# Patient Record
Sex: Female | Born: 1965 | Race: Black or African American | Hispanic: No | Marital: Single | Smoking: Never smoker
Health system: Southern US, Community
[De-identification: ages and names within clinical notes are randomized; demographics above are authoritative.]

## PROBLEM LIST (undated history)

## (undated) DIAGNOSIS — D490 Neoplasm of unspecified behavior of digestive system: Secondary | ICD-10-CM

## (undated) DIAGNOSIS — Z789 Other specified health status: Secondary | ICD-10-CM

## (undated) DIAGNOSIS — L409 Psoriasis, unspecified: Secondary | ICD-10-CM

## (undated) HISTORY — PX: TUMOR REMOVAL: SHX12

## (undated) HISTORY — PX: CHOLECYSTECTOMY: SHX55

---

## 2011-12-31 DIAGNOSIS — L409 Psoriasis, unspecified: Secondary | ICD-10-CM

## 2011-12-31 HISTORY — DX: Psoriasis, unspecified: L40.9

## 2018-12-31 ENCOUNTER — Emergency Department (HOSPITAL_BASED_OUTPATIENT_CLINIC_OR_DEPARTMENT_OTHER): Payer: Medicaid Other

## 2018-12-31 ENCOUNTER — Encounter (HOSPITAL_BASED_OUTPATIENT_CLINIC_OR_DEPARTMENT_OTHER): Payer: Self-pay

## 2018-12-31 ENCOUNTER — Inpatient Hospital Stay (HOSPITAL_BASED_OUTPATIENT_CLINIC_OR_DEPARTMENT_OTHER)
Admission: EM | Admit: 2018-12-31 | Discharge: 2019-01-08 | DRG: 441 | Disposition: A | Discharge status: Other Acute Inpatient Hospital | Payer: Medicaid Other | Attending: Internal Medicine | Admitting: Internal Medicine

## 2018-12-31 ENCOUNTER — Other Ambulatory Visit: Payer: Self-pay

## 2018-12-31 DIAGNOSIS — D75839 Thrombocytosis, unspecified: Secondary | ICD-10-CM

## 2018-12-31 DIAGNOSIS — D62 Acute posthemorrhagic anemia: Secondary | ICD-10-CM

## 2018-12-31 DIAGNOSIS — D473 Essential (hemorrhagic) thrombocythemia: Secondary | ICD-10-CM | POA: Diagnosis present

## 2018-12-31 DIAGNOSIS — Y92239 Unspecified place in hospital as the place of occurrence of the external cause: Secondary | ICD-10-CM | POA: Diagnosis not present

## 2018-12-31 DIAGNOSIS — Z515 Encounter for palliative care: Secondary | ICD-10-CM

## 2018-12-31 DIAGNOSIS — D1771 Benign lipomatous neoplasm of kidney: Secondary | ICD-10-CM | POA: Diagnosis present

## 2018-12-31 DIAGNOSIS — Z7189 Other specified counseling: Secondary | ICD-10-CM

## 2018-12-31 DIAGNOSIS — J942 Hemothorax: Secondary | ICD-10-CM | POA: Diagnosis not present

## 2018-12-31 DIAGNOSIS — R740 Nonspecific elevation of levels of transaminase and lactic acid dehydrogenase [LDH]: Secondary | ICD-10-CM | POA: Diagnosis present

## 2018-12-31 DIAGNOSIS — J189 Pneumonia, unspecified organism: Secondary | ICD-10-CM | POA: Diagnosis not present

## 2018-12-31 DIAGNOSIS — Y95 Nosocomial condition: Secondary | ICD-10-CM | POA: Diagnosis not present

## 2018-12-31 DIAGNOSIS — Y848 Other medical procedures as the cause of abnormal reaction of the patient, or of later complication, without mention of misadventure at the time of the procedure: Secondary | ICD-10-CM | POA: Diagnosis present

## 2018-12-31 DIAGNOSIS — R109 Unspecified abdominal pain: Secondary | ICD-10-CM

## 2018-12-31 DIAGNOSIS — S36119A Unspecified injury of liver, initial encounter: Secondary | ICD-10-CM | POA: Diagnosis not present

## 2018-12-31 DIAGNOSIS — Z800 Family history of malignant neoplasm of digestive organs: Secondary | ICD-10-CM

## 2018-12-31 DIAGNOSIS — F411 Generalized anxiety disorder: Secondary | ICD-10-CM

## 2018-12-31 DIAGNOSIS — R509 Fever, unspecified: Secondary | ICD-10-CM

## 2018-12-31 DIAGNOSIS — K661 Hemoperitoneum: Secondary | ICD-10-CM

## 2018-12-31 DIAGNOSIS — R160 Hepatomegaly, not elsewhere classified: Secondary | ICD-10-CM | POA: Diagnosis present

## 2018-12-31 DIAGNOSIS — R7989 Other specified abnormal findings of blood chemistry: Secondary | ICD-10-CM | POA: Diagnosis not present

## 2018-12-31 DIAGNOSIS — R1013 Epigastric pain: Secondary | ICD-10-CM | POA: Diagnosis present

## 2018-12-31 DIAGNOSIS — R402254 Coma scale, best verbal response, oriented, 24 hours or more after hospital admission: Secondary | ICD-10-CM | POA: Diagnosis not present

## 2018-12-31 DIAGNOSIS — K8020 Calculus of gallbladder without cholecystitis without obstruction: Secondary | ICD-10-CM | POA: Diagnosis present

## 2018-12-31 DIAGNOSIS — D259 Leiomyoma of uterus, unspecified: Secondary | ICD-10-CM | POA: Diagnosis present

## 2018-12-31 DIAGNOSIS — R402364 Coma scale, best motor response, obeys commands, 24 hours or more after hospital admission: Secondary | ICD-10-CM | POA: Diagnosis not present

## 2018-12-31 DIAGNOSIS — Z751 Person awaiting admission to adequate facility elsewhere: Secondary | ICD-10-CM

## 2018-12-31 DIAGNOSIS — K91840 Postprocedural hemorrhage and hematoma of a digestive system organ or structure following a digestive system procedure: Secondary | ICD-10-CM | POA: Diagnosis present

## 2018-12-31 DIAGNOSIS — J90 Pleural effusion, not elsewhere classified: Secondary | ICD-10-CM | POA: Diagnosis not present

## 2018-12-31 DIAGNOSIS — D134 Benign neoplasm of liver: Principal | ICD-10-CM

## 2018-12-31 DIAGNOSIS — D72829 Elevated white blood cell count, unspecified: Secondary | ICD-10-CM

## 2018-12-31 DIAGNOSIS — R918 Other nonspecific abnormal finding of lung field: Secondary | ICD-10-CM | POA: Diagnosis present

## 2018-12-31 DIAGNOSIS — R402144 Coma scale, eyes open, spontaneous, 24 hours or more after hospital admission: Secondary | ICD-10-CM | POA: Diagnosis not present

## 2018-12-31 DIAGNOSIS — Z9889 Other specified postprocedural states: Secondary | ICD-10-CM

## 2018-12-31 DIAGNOSIS — J9811 Atelectasis: Secondary | ICD-10-CM | POA: Diagnosis present

## 2018-12-31 DIAGNOSIS — E876 Hypokalemia: Secondary | ICD-10-CM | POA: Diagnosis present

## 2018-12-31 DIAGNOSIS — R1010 Upper abdominal pain, unspecified: Secondary | ICD-10-CM

## 2018-12-31 DIAGNOSIS — R7401 Elevation of levels of liver transaminase levels: Secondary | ICD-10-CM

## 2018-12-31 DIAGNOSIS — D649 Anemia, unspecified: Secondary | ICD-10-CM

## 2018-12-31 DIAGNOSIS — J986 Disorders of diaphragm: Secondary | ICD-10-CM | POA: Diagnosis present

## 2018-12-31 HISTORY — DX: Psoriasis, unspecified: L40.9

## 2018-12-31 HISTORY — DX: Other specified health status: Z78.9

## 2018-12-31 LAB — HEPATIC FUNCTION PANEL
ALT: 98 U/L — ABNORMAL HIGH (ref 0–44)
AST: 95 U/L — ABNORMAL HIGH (ref 15–41)
Albumin: 3.3 g/dL — ABNORMAL LOW (ref 3.5–5.0)
Alkaline Phosphatase: 265 U/L — ABNORMAL HIGH (ref 38–126)
Bilirubin, Direct: 0.2 mg/dL (ref 0.0–0.2)
Indirect Bilirubin: 0.4 mg/dL (ref 0.3–0.9)
Total Bilirubin: 0.6 mg/dL (ref 0.3–1.2)
Total Protein: 7 g/dL (ref 6.5–8.1)

## 2018-12-31 LAB — CBC
HEMATOCRIT: 35.4 % — AB (ref 36.0–46.0)
Hemoglobin: 11.1 g/dL — ABNORMAL LOW (ref 12.0–15.0)
MCH: 28.5 pg (ref 26.0–34.0)
MCHC: 31.4 g/dL (ref 30.0–36.0)
MCV: 90.8 fL (ref 80.0–100.0)
Platelets: 419 10*3/uL — ABNORMAL HIGH (ref 150–400)
RBC: 3.9 MIL/uL (ref 3.87–5.11)
RDW: 12.9 % (ref 11.5–15.5)
WBC: 13.3 10*3/uL — ABNORMAL HIGH (ref 4.0–10.5)
nRBC: 0 % (ref 0.0–0.2)

## 2018-12-31 LAB — LIPASE, BLOOD: LIPASE: 25 U/L (ref 11–51)

## 2018-12-31 LAB — BASIC METABOLIC PANEL
Anion gap: 8 (ref 5–15)
BUN: 26 mg/dL — ABNORMAL HIGH (ref 6–20)
CO2: 25 mmol/L (ref 22–32)
Calcium: 9.2 mg/dL (ref 8.9–10.3)
Chloride: 104 mmol/L (ref 98–111)
Creatinine, Ser: 0.63 mg/dL (ref 0.44–1.00)
GFR calc Af Amer: >60 mL/min (ref >60)
GFR calc non Af Amer: >60 mL/min (ref >60)
Glucose, Bld: 111 mg/dL — ABNORMAL HIGH (ref 70–99)
Potassium: 3.5 mmol/L (ref 3.5–5.1)
Sodium: 137 mmol/L (ref 135–145)

## 2018-12-31 LAB — TROPONIN I
Troponin I: <0.03 ng/mL (ref <0.03)
Troponin I: <0.03 ng/mL (ref <0.03)

## 2018-12-31 MED ORDER — HYDROMORPHONE HCL 1 MG/ML IJ SOLN
1.0000 mg | Freq: Once | INTRAMUSCULAR | Status: AC
  Administered 2018-12-31: 1 mg via INTRAVENOUS
  Filled 2018-12-31: qty 1

## 2018-12-31 MED ORDER — SODIUM CHLORIDE 0.9 % IV BOLUS
1000.0000 mL | Freq: Once | INTRAVENOUS | Status: AC
  Administered 2018-12-31: 1000 mL via INTRAVENOUS

## 2018-12-31 MED ORDER — IOPAMIDOL (ISOVUE-300) INJECTION 61%
100.0000 mL | Freq: Once | INTRAVENOUS | Status: AC | PRN
  Administered 2018-12-31: 100 mL via INTRAVENOUS

## 2018-12-31 MED ORDER — ONDANSETRON HCL 4 MG/2ML IJ SOLN
4.0000 mg | Freq: Once | INTRAMUSCULAR | Status: AC
  Administered 2018-12-31: 4 mg via INTRAVENOUS
  Filled 2018-12-31: qty 2

## 2018-12-31 NOTE — ED Notes (Signed)
Patient transported to Ultrasound 

## 2018-12-31 NOTE — ED Triage Notes (Signed)
C/o central CP/epigastric pain started today-n/v x 3-NAD-to triage in w/c

## 2018-12-31 NOTE — ED Provider Notes (Signed)
  Physical Exam  BP (!) 123/97   Pulse 87   Temp 98.7 F (37.1 C) (Oral)   Resp 18   Ht 5\' 4"  (1.626 m)   Wt 84.8 kg   SpO2 99%   BMI 32.10 kg/m   Physical Exam  ED Course/Procedures     Procedures  MDM  8:26 PM Patient received from Irena Cords PA at shift change, please see his Note for a full HPI. Briefly, patient with epigastric abdominal pain and chest pain. US showed sludge and gallstones along with masses could be invading into the gallbladder. CT pending; will need surgery consult. Pain is under control 4/10 after dilaudid.  10:38 PM Patient reasessed by myself, her pain is returning and she seems uncomfortable and reports has returned. CT Abdomen and pelvis showed:   1. Numerous nonspecific liver masses measuring up to 9.2 cm. No  extrahepatic invasion is identified. Some differential  considerations include metastasis, primary hepatocellular or biliary  malignancy, hepatic adenomatosis, and lymphoma. MRI of the abdomen  with and without contrast may help to characterize and tissue  sampling is recommended.  2. 8 mm right lower lobe pulmonary nodule.  3. Cholelithiasis. No findings of acute cholecystitis.  4. Multiple uterine fibroids measuring up to 4.2 cm.  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     Spoke to Dr. Marlou Starks who advised patient will benefit from workup at this time will need hospitalist admission, will place call for hospitalist to admit.  10:58 PM Spoke to Dr. Myna Hidalgo who will admit patient to hospitalist service at [facility] hospitalist service.     Janeece Fitting, PA-C 12/31/18 2258    Julianne Rice, MD 01/02/19 [phone]

## 2018-12-31 NOTE — ED Provider Notes (Signed)
[place] EMERGENCY DEPARTMENT Provider Note   CSN: [id number] Arrival date & time: 12/31/18  1543     History   Chief Complaint Chief Complaint  Patient presents with  . Chest Pain    HPI Jodi Forbes is a 53 y.o. female.  HPI Bears upper abdominal discomfort that seems to be in the lower chest as well.  Patient states that she had a similar episode about a month ago.  Patient states that nothing seems to make the condition better but palpation makes the pain worse.  Patient states she did not take any medications other than aspirin prior to arrival.  She had episodes of vomiting x3.  The patient denies chest pain, shortness of breath, headache,blurred vision, neck pain, fever, cough, weakness, numbness, dizziness, anorexia, edema, diarrhea, rash, back pain, dysuria, hematemesis, bloody stool, near syncope, or syncope. History reviewed. No pertinent past medical history.   There are no active problems to display for this patient.   History reviewed. No pertinent surgical history.   OB History   No obstetric history on file.      Home Medications    Prior to Admission medications   Not on File    Family History No family history on file.  Social History Social History   Tobacco Use  . Smoking status: Never Smoker  . Smokeless tobacco: Never Used  Substance Use Topics  . Alcohol use: Never    Frequency: Never  . Drug use: Never     Allergies   Patient has no known allergies.   Review of Systems Review of Systems All other systems negative except as documented in the HPI. All pertinent positives and negatives as reviewed in the HPI.  Physical Exam Updated Vital Signs BP 108/69   Pulse 81   Temp 98.7 F (37.1 C) (Oral)   Resp 15   Ht 5\' 4"  (1.626 m)   Wt 84.8 kg   SpO2 98%   BMI 32.10 kg/m   Physical Exam Vitals signs and nursing note reviewed.  Constitutional:      General: She is not in acute distress.    Appearance:  She is well-developed.  HENT:     Head: Normocephalic and atraumatic.  Eyes:     Pupils: Pupils are equal, round, and reactive to light.  Neck:     Musculoskeletal: Normal range of motion and neck supple.  Cardiovascular:     Rate and Rhythm: Normal rate and regular rhythm.     Heart sounds: Normal heart sounds. No murmur. No friction rub. No gallop.   Pulmonary:     Effort: Pulmonary effort is normal. No respiratory distress.     Breath sounds: Normal breath sounds. No wheezing.  Abdominal:     General: Bowel sounds are normal. There is no distension or abdominal bruit.     Palpations: Abdomen is soft.     Tenderness: There is abdominal tenderness in the right upper quadrant and epigastric area.  Skin:    General: Skin is warm and dry.     Capillary Refill: Capillary refill takes less than 2 seconds.     Findings: No erythema or rash.  Neurological:     Mental Status: She is alert and oriented to person, place, and time.     Motor: No abnormal muscle tone.     Coordination: Coordination normal.  Psychiatric:        Behavior: Behavior normal.      ED Treatments / Results  Labs (  all labs ordered are listed, but only abnormal results are displayed) Labs Reviewed  BASIC METABOLIC PANEL - Abnormal; Notable for the following components:      Result Value   Glucose, Bld 111 (*)    BUN 26 (*)    All other components within normal limits  CBC - Abnormal; Notable for the following components:   WBC 13.3 (*)    Hemoglobin 11.1 (*)    HCT 35.4 (*)    Platelets 419 (*)    All other components within normal limits  TROPONIN I  LIPASE, BLOOD  HEPATIC FUNCTION PANEL  TROPONIN I    EKG None  Radiology Dg Chest 2 View  Result Date: 12/31/2018 CLINICAL DATA:  Chest pain radiating into back today.  Vomiting. EXAM: CHEST - 2 VIEW COMPARISON:  None. FINDINGS: Heart size is normal. Lung volumes are low. Mild bibasilar atelectasis is present. No significant airspace consolidation is  present. There is no edema or effusion. IMPRESSION: Low lung volumes and mild bibasilar atelectasis. No acute cardiopulmonary disease. Electronically Signed   By: San Morelle M.D.   On: 12/31/2018 16:22    Procedures Procedures (including critical care time)  Medications Ordered in ED Medications  sodium chloride 0.9 % bolus 1,000 mL ( Intravenous Stopped 12/31/18 1934)  HYDROmorphone (DILAUDID) injection 1 mg (1 mg Intravenous Given 12/31/18 1828)  ondansetron (ZOFRAN) injection 4 mg (4 mg Intravenous Given 12/31/18 1827)     Initial Impression / Assessment and Plan / ED Course  I have reviewed the triage vital signs and the nursing notes.  Pertinent labs & imaging results that were available during my care of the patient were reviewed by me and considered in my medical decision making (see chart for details).    Will get a repeat troponin along with in addition of hepatic functions to her blood test.  So far her testing does not show any significant abnormality however there is a slight elevation in her white blood cell count.     Final Clinical Impressions(s) / ED Diagnoses   Final diagnoses:  None    ED Discharge Orders    None       Rebeca Allegra 12/31/18 2009    Julianne Rice, MD 01/02/19 [phone]

## 2019-01-01 ENCOUNTER — Observation Stay (HOSPITAL_COMMUNITY): Payer: Medicaid Other

## 2019-01-01 ENCOUNTER — Encounter (HOSPITAL_COMMUNITY): Payer: Self-pay | Admitting: Family Medicine

## 2019-01-01 DIAGNOSIS — R740 Nonspecific elevation of levels of transaminase and lactic acid dehydrogenase [LDH]: Secondary | ICD-10-CM | POA: Diagnosis not present

## 2019-01-01 DIAGNOSIS — K661 Hemoperitoneum: Secondary | ICD-10-CM | POA: Diagnosis not present

## 2019-01-01 DIAGNOSIS — D62 Acute posthemorrhagic anemia: Secondary | ICD-10-CM | POA: Diagnosis not present

## 2019-01-01 DIAGNOSIS — J986 Disorders of diaphragm: Secondary | ICD-10-CM | POA: Diagnosis not present

## 2019-01-01 DIAGNOSIS — J90 Pleural effusion, not elsewhere classified: Secondary | ICD-10-CM | POA: Diagnosis not present

## 2019-01-01 DIAGNOSIS — J9811 Atelectasis: Secondary | ICD-10-CM | POA: Diagnosis not present

## 2019-01-01 DIAGNOSIS — J189 Pneumonia, unspecified organism: Secondary | ICD-10-CM | POA: Diagnosis not present

## 2019-01-01 DIAGNOSIS — R402364 Coma scale, best motor response, obeys commands, 24 hours or more after hospital admission: Secondary | ICD-10-CM | POA: Diagnosis not present

## 2019-01-01 DIAGNOSIS — R402144 Coma scale, eyes open, spontaneous, 24 hours or more after hospital admission: Secondary | ICD-10-CM | POA: Diagnosis not present

## 2019-01-01 DIAGNOSIS — E876 Hypokalemia: Secondary | ICD-10-CM | POA: Diagnosis not present

## 2019-01-01 DIAGNOSIS — D72829 Elevated white blood cell count, unspecified: Secondary | ICD-10-CM

## 2019-01-01 DIAGNOSIS — S36119A Unspecified injury of liver, initial encounter: Secondary | ICD-10-CM | POA: Diagnosis not present

## 2019-01-01 DIAGNOSIS — D259 Leiomyoma of uterus, unspecified: Secondary | ICD-10-CM | POA: Diagnosis not present

## 2019-01-01 DIAGNOSIS — Y848 Other medical procedures as the cause of abnormal reaction of the patient, or of later complication, without mention of misadventure at the time of the procedure: Secondary | ICD-10-CM | POA: Diagnosis not present

## 2019-01-01 DIAGNOSIS — K91840 Postprocedural hemorrhage and hematoma of a digestive system organ or structure following a digestive system procedure: Secondary | ICD-10-CM | POA: Diagnosis not present

## 2019-01-01 DIAGNOSIS — Y92239 Unspecified place in hospital as the place of occurrence of the external cause: Secondary | ICD-10-CM | POA: Diagnosis not present

## 2019-01-01 DIAGNOSIS — Y95 Nosocomial condition: Secondary | ICD-10-CM | POA: Diagnosis not present

## 2019-01-01 DIAGNOSIS — D1771 Benign lipomatous neoplasm of kidney: Secondary | ICD-10-CM | POA: Diagnosis not present

## 2019-01-01 DIAGNOSIS — D473 Essential (hemorrhagic) thrombocythemia: Secondary | ICD-10-CM | POA: Diagnosis not present

## 2019-01-01 DIAGNOSIS — R7989 Other specified abnormal findings of blood chemistry: Secondary | ICD-10-CM | POA: Diagnosis not present

## 2019-01-01 DIAGNOSIS — J942 Hemothorax: Secondary | ICD-10-CM | POA: Diagnosis not present

## 2019-01-01 DIAGNOSIS — Z800 Family history of malignant neoplasm of digestive organs: Secondary | ICD-10-CM | POA: Diagnosis not present

## 2019-01-01 DIAGNOSIS — Z751 Person awaiting admission to adequate facility elsewhere: Secondary | ICD-10-CM | POA: Diagnosis not present

## 2019-01-01 DIAGNOSIS — R1084 Generalized abdominal pain: Secondary | ICD-10-CM | POA: Diagnosis not present

## 2019-01-01 DIAGNOSIS — R1013 Epigastric pain: Secondary | ICD-10-CM | POA: Diagnosis not present

## 2019-01-01 DIAGNOSIS — R402254 Coma scale, best verbal response, oriented, 24 hours or more after hospital admission: Secondary | ICD-10-CM | POA: Diagnosis not present

## 2019-01-01 DIAGNOSIS — D134 Benign neoplasm of liver: Secondary | ICD-10-CM | POA: Diagnosis not present

## 2019-01-01 DIAGNOSIS — R918 Other nonspecific abnormal finding of lung field: Secondary | ICD-10-CM | POA: Diagnosis not present

## 2019-01-01 DIAGNOSIS — K8020 Calculus of gallbladder without cholecystitis without obstruction: Secondary | ICD-10-CM | POA: Diagnosis not present

## 2019-01-01 DIAGNOSIS — D75839 Thrombocytosis, unspecified: Secondary | ICD-10-CM

## 2019-01-01 HISTORY — PX: IR US GUIDE BX ASP/DRAIN: IMG2392

## 2019-01-01 LAB — URINALYSIS, ROUTINE W REFLEX MICROSCOPIC
Bilirubin Urine: NEGATIVE
Glucose, UA: NEGATIVE mg/dL
Ketones, ur: NEGATIVE mg/dL
Leukocytes, UA: NEGATIVE
Nitrite: NEGATIVE
PH: 5 (ref 5.0–8.0)
Protein, ur: NEGATIVE mg/dL
Specific Gravity, Urine: >1.046 — ABNORMAL HIGH (ref 1.005–1.030)

## 2019-01-01 LAB — COMPREHENSIVE METABOLIC PANEL
ALK PHOS: 209 U/L — AB (ref 38–126)
ALT: 393 U/L — ABNORMAL HIGH (ref 0–44)
AST: 414 U/L — ABNORMAL HIGH (ref 15–41)
Albumin: 2.9 g/dL — ABNORMAL LOW (ref 3.5–5.0)
Anion gap: 10 (ref 5–15)
BUN: 26 mg/dL — ABNORMAL HIGH (ref 6–20)
CALCIUM: 8.4 mg/dL — AB (ref 8.9–10.3)
CO2: 21 mmol/L — ABNORMAL LOW (ref 22–32)
Chloride: 108 mmol/L (ref 98–111)
Creatinine, Ser: 0.76 mg/dL (ref 0.44–1.00)
GFR calc Af Amer: >60 mL/min (ref >60)
GFR calc non Af Amer: >60 mL/min (ref >60)
Glucose, Bld: 114 mg/dL — ABNORMAL HIGH (ref 70–99)
Potassium: 4 mmol/L (ref 3.5–5.1)
Sodium: 139 mmol/L (ref 135–145)
Total Bilirubin: 0.8 mg/dL (ref 0.3–1.2)
Total Protein: 6.2 g/dL — ABNORMAL LOW (ref 6.5–8.1)

## 2019-01-01 LAB — CBC WITH DIFFERENTIAL/PLATELET
Abs Immature Granulocytes: 0.11 10*3/uL — ABNORMAL HIGH (ref 0.00–0.07)
Basophils Absolute: 0 10*3/uL (ref 0.0–0.1)
Basophils Relative: 0 %
Eosinophils Absolute: 0 10*3/uL (ref 0.0–0.5)
Eosinophils Relative: 0 %
HCT: 27.7 % — ABNORMAL LOW (ref 36.0–46.0)
Hemoglobin: 8.4 g/dL — ABNORMAL LOW (ref 12.0–15.0)
Immature Granulocytes: 1 %
LYMPHS ABS: 1 10*3/uL (ref 0.7–4.0)
Lymphocytes Relative: 8 %
MCH: 28 pg (ref 26.0–34.0)
MCHC: 30.3 g/dL (ref 30.0–36.0)
MCV: 92.3 fL (ref 80.0–100.0)
MONOS PCT: 7 %
Monocytes Absolute: 1 10*3/uL (ref 0.1–1.0)
Neutro Abs: 11.3 10*3/uL — ABNORMAL HIGH (ref 1.7–7.7)
Neutrophils Relative %: 84 %
Platelets: 400 10*3/uL (ref 150–400)
RBC: 3 MIL/uL — ABNORMAL LOW (ref 3.87–5.11)
RDW: 13 % (ref 11.5–15.5)
WBC: 13.4 10*3/uL — ABNORMAL HIGH (ref 4.0–10.5)
nRBC: 0 % (ref 0.0–0.2)

## 2019-01-01 LAB — GLUCOSE, CAPILLARY: Glucose-Capillary: 101 mg/dL — ABNORMAL HIGH (ref 70–99)

## 2019-01-01 LAB — HIV ANTIBODY (ROUTINE TESTING W REFLEX): HIV Screen 4th Generation wRfx: NONREACTIVE

## 2019-01-01 LAB — PROTIME-INR
INR: 1.2
Prothrombin Time: 15.1 seconds (ref 11.4–15.2)

## 2019-01-01 MED ORDER — ONDANSETRON HCL 4 MG/2ML IJ SOLN: 4.0000 mg | Freq: Four times a day (QID) | INTRAMUSCULAR | Status: DC | PRN

## 2019-01-01 MED ORDER — SODIUM CHLORIDE (PF) 0.9 % IJ SOLN
INTRAMUSCULAR | Status: AC
  Filled 2019-01-01: qty 50

## 2019-01-01 MED ORDER — FENTANYL CITRATE (PF) 100 MCG/2ML IJ SOLN
INTRAMUSCULAR | Status: AC
  Filled 2019-01-01: qty 2

## 2019-01-01 MED ORDER — MIDAZOLAM HCL 2 MG/2ML IJ SOLN
INTRAMUSCULAR | Status: AC
  Filled 2019-01-01: qty 4

## 2019-01-01 MED ORDER — IOPAMIDOL (ISOVUE-300) INJECTION 61%
75.0000 mL | Freq: Once | INTRAVENOUS | Status: AC | PRN
  Administered 2019-01-01: 75 mL via INTRAVENOUS

## 2019-01-01 MED ORDER — GELATIN ABSORBABLE 12-7 MM EX MISC
CUTANEOUS | Status: AC
  Administered 2019-01-01: 1 via INTRADERMAL
  Filled 2019-01-01: qty 1

## 2019-01-01 MED ORDER — ACETAMINOPHEN 650 MG RE SUPP: 650.0000 mg | Freq: Four times a day (QID) | RECTAL | Status: DC | PRN

## 2019-01-01 MED ORDER — POLYETHYLENE GLYCOL 3350 17 G PO PACK
17.0000 g | PACK | Freq: Every day | ORAL | Status: DC
  Administered 2019-01-01: 17 g via ORAL
  Filled 2019-01-01: qty 1

## 2019-01-01 MED ORDER — SODIUM CHLORIDE 0.9 % IV SOLN
3.0000 g | Freq: Four times a day (QID) | INTRAVENOUS | Status: DC
  Administered 2019-01-01 – 2019-01-05 (×16): 3 g via INTRAVENOUS
  Filled 2019-01-01 (×17): qty 3

## 2019-01-01 MED ORDER — SODIUM CHLORIDE 0.9 % IV SOLN
INTRAVENOUS | Status: AC
  Administered 2019-01-01: 02:00:00 via INTRAVENOUS

## 2019-01-01 MED ORDER — FENTANYL CITRATE (PF) 100 MCG/2ML IJ SOLN
25.0000 ug | INTRAMUSCULAR | Status: DC | PRN
  Administered 2019-01-01: 50 ug via INTRAVENOUS
  Administered 2019-01-01: 25 ug via INTRAVENOUS
  Administered 2019-01-01 – 2019-01-05 (×16): 50 ug via INTRAVENOUS
  Filled 2019-01-01 (×18): qty 2

## 2019-01-01 MED ORDER — LIDOCAINE-EPINEPHRINE (PF) 2 %-1:200000 IJ SOLN
INTRAMUSCULAR | Status: AC
  Administered 2019-01-01: 10 mL
  Filled 2019-01-01: qty 20

## 2019-01-01 MED ORDER — ONDANSETRON HCL 4 MG PO TABS: 4.0000 mg | ORAL_TABLET | Freq: Four times a day (QID) | ORAL | Status: DC | PRN

## 2019-01-01 MED ORDER — ACETAMINOPHEN 325 MG PO TABS
650.0000 mg | ORAL_TABLET | Freq: Four times a day (QID) | ORAL | Status: DC | PRN
  Administered 2019-01-02 – 2019-01-08 (×12): 650 mg via ORAL
  Filled 2019-01-01 (×13): qty 2

## 2019-01-01 MED ORDER — GADOBUTROL 1 MMOL/ML IV SOLN
10.0000 mL | Freq: Once | INTRAVENOUS | Status: AC | PRN
  Administered 2019-01-01: 9 mL via INTRAVENOUS

## 2019-01-01 MED ORDER — FENTANYL CITRATE (PF) 100 MCG/2ML IJ SOLN
INTRAMUSCULAR | Status: AC | PRN
  Administered 2019-01-01: 50 ug via INTRAVENOUS
  Administered 2019-01-01: 25 ug via INTRAVENOUS

## 2019-01-01 MED ORDER — SODIUM CHLORIDE 0.9 % IV SOLN
INTRAVENOUS | Status: DC
  Administered 2019-01-01 – 2019-01-04 (×6): via INTRAVENOUS

## 2019-01-01 MED ORDER — MIDAZOLAM HCL 2 MG/2ML IJ SOLN
INTRAMUSCULAR | Status: AC | PRN
  Administered 2019-01-01: 1 mg via INTRAVENOUS
  Administered 2019-01-01: 0.5 mg via INTRAVENOUS

## 2019-01-01 MED ORDER — SENNOSIDES-DOCUSATE SODIUM 8.6-50 MG PO TABS: 1.0000 | ORAL_TABLET | Freq: Every evening | ORAL | Status: DC | PRN

## 2019-01-01 NOTE — Progress Notes (Signed)
Pharmacy Antibiotic Note  Jodi Forbes is a 53 y.o. female admitted on 12/31/2018 with abdominal pain, liver masses seen on MRI.  R/o primary liver cancer vs metastasis vs infection.  Pharmacy has been consulted for Unasyn dosing.  Plan: Unasyn 3g IV q6h. Follow up renal fxn, culture results, and clinical course.   Height: 5\' 4"  (162.6 cm) Weight: 187 lb (84.8 kg) IBW/kg (Calculated) : 54.7  Temp (24hrs), Avg:98.7 F (37.1 C), Min:98.4 F (36.9 C), Max:99.2 F (37.3 C)  Recent Labs  Lab 12/31/18 1626 01/01/19 0528  WBC 13.3* 13.4*  CREATININE 0.63 0.76    Estimated Creatinine Clearance: 87.6 mL/min (by C-G formula based on SCr of 0.76 mg/dL).    No Known Allergies  Antimicrobials this admission: 1/3 Unasyn >>   Dose adjustments this admission:   Microbiology results: 1/3 BCx: ngtd 1/3 UCx: sent 1/3 Liver bx:    Thank you for allowing pharmacy to be a part of this patient's care.  Gretta Arab PharmD, BCPS Pager [phone] 01/01/2019 5:35 PM

## 2019-01-01 NOTE — Progress Notes (Signed)
Patient ID: Jodi Forbes, female   DOB: 1966-10-30, 53 y.o.   MRN: [id number]   Results of CT and MRI noted. Metastatic workup ongoing. Doubt gallstones are her issue.

## 2019-01-01 NOTE — Progress Notes (Signed)
PROGRESS NOTE    Jodi Forbes  KVQ:[id number] DOB: December 15, 1966 DOA: 12/31/2018 PCP: Patient, No Pcp Per    Brief Narrative: 53 year old with no significant past medical history who presented to the emergency department complaining of abdominal pain that started the night prior to admission she also report nausea vomiting restarted the day prior to admission. Evaluation in the ED showed creased transaminases alkaline phosphatase 265, leukocytosis at 13,.  Right upper quadrant ultrasound was concerning for large solid-appearing masses with concern for possible invasion into the gallbladder.  CT abdomen show numerous nonspecific liver masses without extrahepatic invasion possible metastatic versus primary hepatocellular or biliary malignancy.   Assessment & Plan:   Principal Problem:   Liver masses Active Problems:   Leukocytosis   Thrombocytosis (HCC)  1-Liver masses; multiple per MRI This could be related to primary liver cancer versus metastasis but will also rule out infection. Will check blood cultures. Patient underwent liver biopsy.  Will start IV antibiotics. Follow alpha-fetoprotein level and CEA level.  HIV negative. Hepatitis panel pending. Will check CT chest to rule out metastasis in the chest or primary malignancy  Leukocytosis thrombocytosis Could be reactive. Will check blood cultures. Follow trend.  Anemia; probably hemoconcentration on admission Will check anemia panel. Repeat hemoglobin in the morning. Blood transfusion if hemoglobin less than 7.            Estimated body mass index is 32.1 kg/m as calculated from the following:   Height as of this encounter: 5\' 4"  (1.626 m).   Weight as of this encounter: 84.8 kg.   DVT prophylaxis: SCDs Code Status: Full code Family Communication: Multiple family at bedside Disposition Plan: Remain in the hospital for IV antibiotics and further evaluation of multiple liver mass, pain  management.  Consultants:   Surgery  I R   Procedures:  Liver biopsy Antimicrobials:   Unasyn  Subjective: She is complaining of right side pain, 8 out of 10 No vomiting today, nauseous  Objective: Vitals:   12/31/18 2300 12/31/18 2340 01/01/19 0037 01/01/19 0543  BP: (!) 98/57 (!) 96/56 118/69 110/63  Pulse: 81 96 81 88  Resp: 14 15 17 17   Temp:   98.4 F (36.9 C) 98.6 F (37 C)  TempSrc:   Oral Oral  SpO2: 97% 96% 97% 94%  Weight:      Height:        Intake/Output Summary (Last 24 hours) at 01/01/2019 1103 Last data filed at 01/01/2019 1000 Gross per 24 hour  Intake 1380.01 ml  Output 0 ml  Net 1380.01 ml   Filed Weights   12/31/18 1555  Weight: 84.8 kg    Examination:  General exam: Appears calm and comfortable  Respiratory system: Clear to auscultation. Respiratory effort normal. Cardiovascular system: S1 & S2 heard, RRR. No JVD, murmurs, rubs, gallops or clicks. No pedal edema. Gastrointestinal system: Abdomen is soft, tender nurse on the right side, no rigidity. Central nervous system: Alert and oriented. No focal neurological deficits. Extremities: Symmetric 5 x 5 power. Skin: No rashes, lesions or ulcers Psychiatry: Judgement and insight appear normal. Mood & affect appropriate.     Data Reviewed: I have personally reviewed following labs and imaging studies  CBC: Recent Labs  Lab 12/31/18 1626 01/01/19 0528  WBC 13.3* 13.4*  NEUTROABS  --  11.3*  HGB 11.1* 8.4*  HCT 35.4* 27.7*  MCV 90.8 92.3  PLT 419* 643   Basic Metabolic Panel: Recent Labs  Lab 12/31/18 1626 01/01/19 0528  NA  137 139  K 3.5 4.0  CL 104 108  CO2 25 21*  GLUCOSE 111* 114*  BUN 26* 26*  CREATININE 0.63 0.76  CALCIUM 9.2 8.4*   GFR: Estimated Creatinine Clearance: 87.6 mL/min (by C-G formula based on SCr of 0.76 mg/dL). Liver Function Tests: Recent Labs  Lab 12/31/18 2006 01/01/19 0528  AST 95* 414*  ALT 98* 393*  ALKPHOS 265* 209*  BILITOT 0.6 0.8   PROT 7.0 6.2*  ALBUMIN 3.3* 2.9*   Recent Labs  Lab 12/31/18 1626  LIPASE 25   No results for input(s): AMMONIA in the last 168 hours. Coagulation Profile: Recent Labs  Lab 01/01/19 0528  INR 1.20   Cardiac Enzymes: Recent Labs  Lab 12/31/18 1626 12/31/18 2006  TROPONINI <0.03 <0.03   BNP (last 3 results) No results for input(s): PROBNP in the last 8760 hours. HbA1C: No results for input(s): HGBA1C in the last 72 hours. CBG: Recent Labs  Lab 01/01/19 0752  GLUCAP 101*   Lipid Profile: No results for input(s): CHOL, HDL, LDLCALC, TRIG, CHOLHDL, LDLDIRECT in the last 72 hours. Thyroid Function Tests: No results for input(s): TSH, T4TOTAL, FREET4, T3FREE, THYROIDAB in the last 72 hours. Anemia Panel: No results for input(s): VITAMINB12, FOLATE, FERRITIN, TIBC, IRON, RETICCTPCT in the last 72 hours. Sepsis Labs: No results for input(s): PROCALCITON, LATICACIDVEN in the last 168 hours.  No results found for this or any previous visit (from the past 240 hour(s)).       Radiology Studies: Dg Chest 2 View  Result Date: 12/31/2018 CLINICAL DATA:  Chest pain radiating into back today.  Vomiting. EXAM: CHEST - 2 VIEW COMPARISON:  None. FINDINGS: Heart size is normal. Lung volumes are low. Mild bibasilar atelectasis is present. No significant airspace consolidation is present. There is no edema or effusion. IMPRESSION: Low lung volumes and mild bibasilar atelectasis. No acute cardiopulmonary disease. Electronically Signed   By: San Morelle M.D.   On: 12/31/2018 16:22   Mr Abdomen W Wo Contrast  Result Date: 01/01/2019 CLINICAL DATA:  Followup multiple liver lesions. EXAM: MRI ABDOMEN WITHOUT AND WITH CONTRAST TECHNIQUE: Multiplanar multisequence MR imaging of the abdomen was performed both before and after the administration of intravenous contrast. CONTRAST:  10 cc Gadavist COMPARISON:  Ultrasound abdomen, 12/31/2018 and CT abdomen 12/31/2018. FINDINGS: Lower  chest: Bibasilar atelectasis is noted. No obvious pulmonary lesions. An 8 mm right lower lobe pulmonary nodule was noted on the CT scan. The distal esophagus is grossly normal. Hepatobiliary: As demonstrated on the ultrasound and CT scan there are innumerable large masses throughout both lobes of the liver. 8 cm segment 8 liver lesion appears to have a central scar. Similar appearance of the 9 cm segment 3 lesion which has central necrosis and hemorrhage. 9.5 cm segment 6 lesion demonstrates extensive necrosis with variable areas of hemorrhage. Gallstones are again noted in the gallbladder but no definite findings for acute cholecystitis. Pancreas:  No mass, inflammation or ductal dilatation. Spleen:  Normal size.  No focal lesions. Adrenals/Urinary Tract: The adrenal glands and kidneys are unremarkable. Simple left renal cyst is noted. Stomach/Bowel: No evidence of a gastric mass. The small bowel and colon are grossly normal. No obvious colon cancer is demonstrated on this exam or the CT scan. Vascular/Lymphatic: The aorta and branch vessels are patent. The major venous structures are patent. No mesenteric mass or adenopathy. No retroperitoneal adenopathy. Other:  No ascites or abdominal wall hernia. Musculoskeletal: No significant bony findings. IMPRESSION: 1. Numerous large  masses throughout both lobes of the liver, likely metastatic disease but possible multifocal primary hepatic tumors. No obvious primary neoplasm is identified. Specifically, I do not see any evidence of a gastric or pancreatic tumor or colon cancer. No obvious breast masses. 2. Recommend consideration for ultrasound-guided liver biopsy and chest CT to exclude a pulmonary neoplasm. Electronically Signed   By: Marijo Sanes M.D.   On: 01/01/2019 09:29   Ct Abdomen Pelvis W Contrast  Result Date: 12/31/2018 CLINICAL DATA:  53 y/o F; epigastric pain radiating to the back with nausea and vomiting for 3 days. EXAM: CT ABDOMEN AND PELVIS WITH  CONTRAST TECHNIQUE: Multidetector CT imaging of the abdomen and pelvis was performed using the standard protocol following bolus administration of intravenous contrast. CONTRAST:  148mL ISOVUE-300 IOPAMIDOL (ISOVUE-300) INJECTION 61% COMPARISON:  None. FINDINGS: Lower chest: 8 mm right lower lobe pulmonary nodule (series 4, image 1). Hepatobiliary: Cholelithiasis. No gallbladder wall thickening or biliary ductal dilatation. Numerous liver masses occupying approximately 70% of liver parenchyma measuring up to 9.2 x 9.1 cm in the right lobe of liver (series 2, image 24). Several masses are peripheral and exophytic, however, no extrahepatic invasion is identified. Pancreas: Unremarkable. No pancreatic ductal dilatation or surrounding inflammatory changes. Spleen: Normal in size without focal abnormality. Adrenals/Urinary Tract: Normal adrenal glands. 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 No additional focal kidney lesion, urinary stone disease, hydronephrosis. Normal bladder. Stomach/Bowel: Stomach is within normal limits. Appendix appears normal. No evidence of bowel wall thickening, distention, or inflammatory changes. Vascular/Lymphatic: No significant vascular findings are present. No enlarged abdominal or pelvic lymph nodes. Reproductive: Multiple uterine fibroids measuring up to 4.2 cm. No adnexal mass identified. Other: No abdominal wall hernia or abnormality. No abdominopelvic ascites. Musculoskeletal: No acute or significant osseous findings. IMPRESSION: 1. Numerous nonspecific liver masses measuring up to 9.2 cm. No extrahepatic invasion is identified. Some differential considerations include metastasis, primary hepatocellular or biliary malignancy, hepatic adenomatosis, and lymphoma. MRI of the abdomen with and without contrast may help to characterize and tissue sampling is recommended. 2. 8 mm right lower lobe pulmonary nodule. 3. Cholelithiasis.  No findings of  acute cholecystitis. 4. Multiple uterine fibroids measuring up to 4.2 cm. 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 Electronically Signed   By: Kristine Garbe M.D.   On: 12/31/2018 21:42   US Abdomen Limited  Result Date: 12/31/2018 CLINICAL DATA:  Sudden onset epigastric pain with nausea and vomiting. EXAM: ULTRASOUND ABDOMEN LIMITED RIGHT UPPER QUADRANT COMPARISON:  None. FINDINGS: Gallbladder: Small gallstones and sludge are noted. No wall thickening, however there is a focal irregularity of the gallbladder wall adjacent to a large mass in the right hepatic lobe. No sonographic Murphy sign noted by sonographer. Common bile duct: Diameter: 1 mm, normal. Liver: There is a 6.2 x 4.7 x 4.0 cm heterogeneous mass with internal vascularity in the right hepatic lobe adjacent to the gallbladder. There is also a 5.5 x 2.1 x 2.9 cm heterogeneous mass with internal vascularity in the left hepatic lobe. Heterogeneous parenchymal echogenicity. Portal vein is patent on color Doppler imaging with normal direction of blood flow towards the liver. IMPRESSION: 1. Large solid-appearing heterogeneous masses in the left and right hepatic lobes. The right hepatic lobe mass may be invading the gallbladder given adjacent gallbladder wall irregularity. Findings are concerning for malignancy. Recommend CT of the abdomen and pelvis with contrast for further evaluation. 2. Cholelithiasis and sludge without sonographic evidence of acute cholecystitis. Electronically Signed  By: Titus Dubin M.D.   On: 12/31/2018 20:04        Scheduled Meds: Continuous Infusions: . sodium chloride 90 mL/hr at 01/01/19 0148     LOS: 0 days    Time spent: 35 minutes    Elmarie Shiley, MD [facility] Pager [phone]  If 7PM-7AM, please contact night-coverage [email or web address] Password [email or web address] 01/01/2019, 11:03 AM

## 2019-01-01 NOTE — Procedures (Signed)
Pre Procedure Dx: Liver lesion Post Procedural Dx: Same  Technically successful US guided biopsy of Indeterminate lesion within the right lobe of the liver.   EBL: None  No immediate complications.   Ronny Bacon, MD Pager #: [phone]

## 2019-01-01 NOTE — Consult Note (Signed)
Chief Complaint: Patient was seen in consultation today for image guided liver lesion biopsy Chief Complaint  Patient presents with  . Chest Pain    Referring Physician(s): Regalado,B  Supervising Physician: Sandi Mariscal  Patient Status: [facility] - In-pt  History of Present Illness: Jodi Forbes is a 53 y.o. female non-smoker with no significant past medical history who was admitted to [facility] yesterday with persistent abdominal pain with radiation to the back as well as nausea and vomiting.  Subsequent imaging has revealed numerous large masses throughout both lobes of the liver, 8 mm right lower lobe lung nodule, cholelithiasis, multiple uterine fibroids and 8 mm left kidney interpolar angiomyolipoma.  AFP and CEA levels are pending.  Labs include WBC 13.4, hemoglobin 8.4, platelets 400K, creatinine 0.76, LFT's elevated with exception of T bilirubin, PT 15.1, INR 1.2.  Request now received for image guided liver lesion biopsy for further evaluation.  Past Medical History:  Diagnosis Date  . Medical history non-contributory     History reviewed. No pertinent surgical history.  Allergies: Patient has no known allergies.  Medications: Prior to Admission medications   Not on File     Family History  Problem Relation Age of Onset  . Diabetes Mother   . CAD Father     Social History   Socioeconomic History  . Marital status: Single    Spouse name: Not on file  . Number of children: Not on file  . Years of education: Not on file  . Highest education level: Not on file  Occupational History  . Not on file  Social Needs  . Financial resource strain: Not on file  . Food insecurity:    Worry: Not on file    Inability: Not on file  . Transportation needs:    Medical: Not on file    Non-medical: Not on file  Tobacco Use  . Smoking status: Never Smoker  . Smokeless tobacco: Never Used  Substance and Sexual Activity  . Alcohol use: Never   Frequency: Never  . Drug use: Never  . Sexual activity: Not on file  Lifestyle  . Physical activity:    Days per week: Not on file    Minutes per session: Not on file  . Stress: Not on file  Relationships  . Social connections:    Talks on phone: Not on file    Gets together: Not on file    Attends religious service: Not on file    Active member of club or organization: Not on file    Attends meetings of clubs or organizations: Not on file    Relationship status: Not on file  Other Topics Concern  . Not on file  Social History Narrative  . Not on file     Review of Systems see above; currently denies fever, headache, chest pain, dyspnea, cough, wt loss ,night sweats or abnormal bleeding. She  is anxious.  Vital Signs: BP 110/63 (BP Location: Left Arm)   Pulse 88   Temp 98.6 F (37 C) (Oral)   Resp 17   Ht 5\' 4"  (1.626 m)   Wt 187 lb (84.8 kg)   SpO2 94%   BMI 32.10 kg/m   Physical Exam awake, alert.  Chest clear to auscultation bilaterally.  Heart with regular rate and rhythm.  Abdomen soft, positive bowel sounds, moderate tenderness to palpation, primarily epigastric region; no lower extremity edema  Imaging: Dg Chest 2 View  Result Date: 12/31/2018 CLINICAL DATA:  Chest  pain radiating into back today.  Vomiting. EXAM: CHEST - 2 VIEW COMPARISON:  None. FINDINGS: Heart size is normal. Lung volumes are low. Mild bibasilar atelectasis is present. No significant airspace consolidation is present. There is no edema or effusion. IMPRESSION: Low lung volumes and mild bibasilar atelectasis. No acute cardiopulmonary disease. Electronically Signed   By: San Morelle M.D.   On: 12/31/2018 16:22   Mr Abdomen W Wo Contrast  Result Date: 01/01/2019 CLINICAL DATA:  Followup multiple liver lesions. EXAM: MRI ABDOMEN WITHOUT AND WITH CONTRAST TECHNIQUE: Multiplanar multisequence MR imaging of the abdomen was performed both before and after the administration of intravenous  contrast. CONTRAST:  10 cc Gadavist COMPARISON:  Ultrasound abdomen, 12/31/2018 and CT abdomen 12/31/2018. FINDINGS: Lower chest: Bibasilar atelectasis is noted. No obvious pulmonary lesions. An 8 mm right lower lobe pulmonary nodule was noted on the CT scan. The distal esophagus is grossly normal. Hepatobiliary: As demonstrated on the ultrasound and CT scan there are innumerable large masses throughout both lobes of the liver. 8 cm segment 8 liver lesion appears to have a central scar. Similar appearance of the 9 cm segment 3 lesion which has central necrosis and hemorrhage. 9.5 cm segment 6 lesion demonstrates extensive necrosis with variable areas of hemorrhage. Gallstones are again noted in the gallbladder but no definite findings for acute cholecystitis. Pancreas:  No mass, inflammation or ductal dilatation. Spleen:  Normal size.  No focal lesions. Adrenals/Urinary Tract: The adrenal glands and kidneys are unremarkable. Simple left renal cyst is noted. Stomach/Bowel: No evidence of a gastric mass. The small bowel and colon are grossly normal. No obvious colon cancer is demonstrated on this exam or the CT scan. Vascular/Lymphatic: The aorta and branch vessels are patent. The major venous structures are patent. No mesenteric mass or adenopathy. No retroperitoneal adenopathy. Other:  No ascites or abdominal wall hernia. Musculoskeletal: No significant bony findings. IMPRESSION: 1. Numerous large masses throughout both lobes of the liver, likely metastatic disease but possible multifocal primary hepatic tumors. No obvious primary neoplasm is identified. Specifically, I do not see any evidence of a gastric or pancreatic tumor or colon cancer. No obvious breast masses. 2. Recommend consideration for ultrasound-guided liver biopsy and chest CT to exclude a pulmonary neoplasm. Electronically Signed   By: Marijo Sanes M.D.   On: 01/01/2019 09:29   Ct Abdomen Pelvis W Contrast  Result Date: 12/31/2018 CLINICAL DATA:   53 y/o F; epigastric pain radiating to the back with nausea and vomiting for 3 days. EXAM: CT ABDOMEN AND PELVIS WITH CONTRAST TECHNIQUE: Multidetector CT imaging of the abdomen and pelvis was performed using the standard protocol following bolus administration of intravenous contrast. CONTRAST:  177mL ISOVUE-300 IOPAMIDOL (ISOVUE-300) INJECTION 61% COMPARISON:  None. FINDINGS: Lower chest: 8 mm right lower lobe pulmonary nodule (series 4, image 1). Hepatobiliary: Cholelithiasis. No gallbladder wall thickening or biliary ductal dilatation. Numerous liver masses occupying approximately 70% of liver parenchyma measuring up to 9.2 x 9.1 cm in the right lobe of liver (series 2, image 24). Several masses are peripheral and exophytic, however, no extrahepatic invasion is identified. Pancreas: Unremarkable. No pancreatic ductal dilatation or surrounding inflammatory changes. Spleen: Normal in size without focal abnormality. Adrenals/Urinary Tract: Normal adrenal glands. 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 No additional focal kidney lesion, urinary stone disease, hydronephrosis. Normal bladder. Stomach/Bowel: Stomach is within normal limits. Appendix appears normal. No evidence of bowel wall thickening, distention, or inflammatory changes. Vascular/Lymphatic: No significant vascular findings  are present. No enlarged abdominal or pelvic lymph nodes. Reproductive: Multiple uterine fibroids measuring up to 4.2 cm. No adnexal mass identified. Other: No abdominal wall hernia or abnormality. No abdominopelvic ascites. Musculoskeletal: No acute or significant osseous findings. IMPRESSION: 1. Numerous nonspecific liver masses measuring up to 9.2 cm. No extrahepatic invasion is identified. Some differential considerations include metastasis, primary hepatocellular or biliary malignancy, hepatic adenomatosis, and lymphoma. MRI of the abdomen with and without contrast may help to  characterize and tissue sampling is recommended. 2. 8 mm right lower lobe pulmonary nodule. 3. Cholelithiasis.  No findings of acute cholecystitis. 4. Multiple uterine fibroids measuring up to 4.2 cm. 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 Electronically Signed   By: Kristine Garbe M.D.   On: 12/31/2018 21:42   US Abdomen Limited  Result Date: 12/31/2018 CLINICAL DATA:  Sudden onset epigastric pain with nausea and vomiting. EXAM: ULTRASOUND ABDOMEN LIMITED RIGHT UPPER QUADRANT COMPARISON:  None. FINDINGS: Gallbladder: Small gallstones and sludge are noted. No wall thickening, however there is a focal irregularity of the gallbladder wall adjacent to a large mass in the right hepatic lobe. No sonographic Murphy sign noted by sonographer. Common bile duct: Diameter: 1 mm, normal. Liver: There is a 6.2 x 4.7 x 4.0 cm heterogeneous mass with internal vascularity in the right hepatic lobe adjacent to the gallbladder. There is also a 5.5 x 2.1 x 2.9 cm heterogeneous mass with internal vascularity in the left hepatic lobe. Heterogeneous parenchymal echogenicity. Portal vein is patent on color Doppler imaging with normal direction of blood flow towards the liver. IMPRESSION: 1. Large solid-appearing heterogeneous masses in the left and right hepatic lobes. The right hepatic lobe mass may be invading the gallbladder given adjacent gallbladder wall irregularity. Findings are concerning for malignancy. Recommend CT of the abdomen and pelvis with contrast for further evaluation. 2. Cholelithiasis and sludge without sonographic evidence of acute cholecystitis. Electronically Signed   By: Titus Dubin M.D.   On: 12/31/2018 20:04    Labs:  CBC: Recent Labs    12/31/18 1626 01/01/19 0528  WBC 13.3* 13.4*  HGB 11.1* 8.4*  HCT 35.4* 27.7*  PLT 419* 400    COAGS: Recent Labs    01/01/19 0528  INR 1.20    BMP: Recent Labs    12/31/18 1626 01/01/19 0528  NA 137 139  K 3.5 4.0  CL 104  108  CO2 25 21*  GLUCOSE 111* 114*  BUN 26* 26*  CALCIUM 9.2 8.4*  CREATININE 0.63 0.76  GFRNONAA >60 >60  GFRAA >60 >60    LIVER FUNCTION TESTS: Recent Labs    12/31/18 2006 01/01/19 0528  BILITOT 0.6 0.8  AST 95* 414*  ALT 98* 393*  ALKPHOS 265* 209*  PROT 7.0 6.2*  ALBUMIN 3.3* 2.9*    TUMOR MARKERS: No results for input(s): AFPTM, CEA, CA199, CHROMGRNA in the last 8760 hours.  Assessment and Plan: 53 y.o. female non-smoker with no significant past medical history who was admitted to [facility] yesterday with persistent abdominal pain with radiation to the back as well as nausea and vomiting.  Subsequent imaging has revealed numerous large masses throughout both lobes of the liver, 8 mm right lower lobe lung nodule, cholelithiasis, multiple uterine fibroids and 8 mm left kidney interpolar angiomyolipoma.  AFP and CEA levels are pending.  Labs include WBC 13.4, hemoglobin 8.4, platelets 400K, creatinine 0.76, LFT's elevated with exception of T bilirubin, PT 15.1, INR 1.2.  Request now received for  image guided liver lesion biopsy for further evaluation.  Imaging studies have been reviewed by Dr. Pascal Lux and liver lesions appear amenable to biopsy.Risks and benefits discussed with the patient including, but not limited to bleeding, infection, damage to adjacent structures or low yield requiring additional tests.  All of the patient's questions were answered, patient is agreeable to proceed. Consent signed and in chart.  Procedure scheduled for this afternoon.   Thank you for this interesting consult.  I greatly enjoyed meeting Inda Mcglothen and look forward to participating in their care.  A copy of this report was sent to the requesting provider on this date.  Electronically Signed: D. Rowe Robert, PA-C 01/01/2019, 1:16 PM   I spent a total of 30 minutes in face to face in clinical consultation, greater than 50% of which was counseling/coordinating care for  image guided liver lesion biopsy

## 2019-01-01 NOTE — Consult Note (Signed)
Reason for Consult:abd pain Referring Physician: Dr. Loanne Drilling is an 53 y.o. female.  HPI: The patient is a 53 year old black female who presents with abdominal pain that started yesterday evening.  She reports having one episode of pain similar to this but not as severe back in September.  At that time the pain resolved with some ibuprofen.  Her pain yesterday was much more severe and radiated into the back.  She did have a few episodes of nausea and vomiting.  She denies any fevers or chills.  She came to the emergency department where a CT scan showed some stones in the gallbladder but no gallbladder wall thickening or ductal dilatation.  She was also found to have significant masses throughout the liver.  She denies any recent weight loss or malaise.  She is never had any abdominal surgery.  Past Medical History:  Diagnosis Date  . Medical history non-contributory     History reviewed. No pertinent surgical history.  Family History  Problem Relation Age of Onset  . Diabetes Mother   . CAD Father     Social History:  reports that she has never smoked. She has never used smokeless tobacco. She reports that she does not drink alcohol or use drugs.  Allergies: No Known Allergies  Medications: I have reviewed the patient's current medications.  Results for orders placed or performed during the hospital encounter of 12/31/18 (from the past 48 hour(s))  Basic metabolic panel     Status: Abnormal   Collection Time: 12/31/18  4:26 PM  Result Value Ref Range   Sodium 137 135 - 145 mmol/L   Potassium 3.5 3.5 - 5.1 mmol/L   Chloride 104 98 - 111 mmol/L   CO2 25 22 - 32 mmol/L   Glucose, Bld 111 (H) 70 - 99 mg/dL   BUN 26 (H) 6 - 20 mg/dL   Creatinine, Ser 0.63 0.44 - 1.00 mg/dL   Calcium 9.2 8.9 - 10.3 mg/dL   GFR calc non Af Amer >60 >60 mL/min   GFR calc Af Amer >60 >60 mL/min   Anion gap 8 5 - 15    Comment: Performed at [facility], [address] [place], [place] [address]  CBC     Status: Abnormal   Collection Time: 12/31/18  4:26 PM  Result Value Ref Range   WBC 13.3 (H) 4.0 - 10.5 K/uL   RBC 3.90 3.87 - 5.11 MIL/uL   Hemoglobin 11.1 (L) 12.0 - 15.0 g/dL   HCT 35.4 (L) 36.0 - 46.0 %   MCV 90.8 80.0 - 100.0 fL   MCH 28.5 26.0 - 34.0 pg   MCHC 31.4 30.0 - 36.0 g/dL   RDW 12.9 11.5 - 15.5 %   Platelets 419 (H) 150 - 400 K/uL   nRBC 0.0 0.0 - 0.2 %    Comment: Performed at [facility], [address] [place], [place] [address]  Troponin I - ONCE - STAT     Status: None   Collection Time: 12/31/18  4:26 PM  Result Value Ref Range   Troponin I <0.03 <0.03 ng/mL    Comment: Performed at [facility], [address] [place], [place] [address]  Lipase, blood     Status: None   Collection Time: 12/31/18  4:26 PM  Result Value Ref Range   Lipase 25 11 - 51 U/L    Comment: Performed at [facility],  [address] [place], [place] [address]  Hepatic function panel     Status: Abnormal   Collection Time: 12/31/18  8:06 PM  Result Value Ref Range   Total Protein 7.0 6.5 - 8.1 g/dL   Albumin 3.3 (L) 3.5 - 5.0 g/dL   AST 95 (H) 15 - 41 U/L   ALT 98 (H) 0 - 44 U/L   Alkaline Phosphatase 265 (H) 38 - 126 U/L   Total Bilirubin 0.6 0.3 - 1.2 mg/dL   Bilirubin, Direct 0.2 0.0 - 0.2 mg/dL   Indirect Bilirubin 0.4 0.3 - 0.9 mg/dL    Comment: Performed at [facility], [address] [place], [place] [address]  Troponin I - Once     Status: None   Collection Time: 12/31/18  8:06 PM  Result Value Ref Range   Troponin I <0.03 <0.03 ng/mL    Comment: Performed at [facility], [address] [place],  [address]    Dg Chest 2 View  Result Date: 12/31/2018 CLINICAL DATA:  Chest pain radiating into back today.  Vomiting. EXAM: CHEST - 2 VIEW COMPARISON:  None. FINDINGS: Heart size is normal. Lung volumes are low. Mild bibasilar atelectasis is present. No significant airspace  consolidation is present. There is no edema or effusion. IMPRESSION: Low lung volumes and mild bibasilar atelectasis. No acute cardiopulmonary disease. Electronically Signed   By: San Morelle M.D.   On: 12/31/2018 16:22   Ct Abdomen Pelvis W Contrast  Result Date: 12/31/2018 CLINICAL DATA:  53 y/o F; epigastric pain radiating to the back with nausea and vomiting for 3 days. EXAM: CT ABDOMEN AND PELVIS WITH CONTRAST TECHNIQUE: Multidetector CT imaging of the abdomen and pelvis was performed using the standard protocol following bolus administration of intravenous contrast. CONTRAST:  131mL ISOVUE-300 IOPAMIDOL (ISOVUE-300) INJECTION 61% COMPARISON:  None. FINDINGS: Lower chest: 8 mm right lower lobe pulmonary nodule (series 4, image 1). Hepatobiliary: Cholelithiasis. No gallbladder wall thickening or biliary ductal dilatation. Numerous liver masses occupying approximately 70% of liver parenchyma measuring up to 9.2 x 9.1 cm in the right lobe of liver (series 2, image 24). Several masses are peripheral and exophytic, however, no extrahepatic invasion is identified. Pancreas: Unremarkable. No pancreatic ductal dilatation or surrounding inflammatory changes. Spleen: Normal in size without focal abnormality. Adrenals/Urinary Tract: Normal adrenal glands. 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 No additional focal kidney lesion, urinary stone disease, hydronephrosis. Normal bladder. Stomach/Bowel: Stomach is within normal limits. Appendix appears normal. No evidence of bowel wall thickening, distention, or inflammatory changes. Vascular/Lymphatic: No significant vascular findings are present. No enlarged abdominal or pelvic lymph nodes. Reproductive: Multiple uterine fibroids measuring up to 4.2 cm. No adnexal mass identified. Other: No abdominal wall hernia or abnormality. No abdominopelvic ascites. Musculoskeletal: No acute or significant osseous findings. IMPRESSION:  1. Numerous nonspecific liver masses measuring up to 9.2 cm. No extrahepatic invasion is identified. Some differential considerations include metastasis, primary hepatocellular or biliary malignancy, hepatic adenomatosis, and lymphoma. MRI of the abdomen with and without contrast may help to characterize and tissue sampling is recommended. 2. 8 mm right lower lobe pulmonary nodule. 3. Cholelithiasis.  No findings of acute cholecystitis. 4. Multiple uterine fibroids measuring up to 4.2 cm. 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 Electronically Signed   By: Kristine Garbe M.D.   On: 12/31/2018 21:42   US Abdomen Limited  Result Date: 12/31/2018 CLINICAL DATA:  Sudden onset  epigastric pain with nausea and vomiting. EXAM: ULTRASOUND ABDOMEN LIMITED RIGHT UPPER QUADRANT COMPARISON:  None. FINDINGS: Gallbladder: Small gallstones and sludge are noted. No wall thickening, however there is a focal irregularity of the gallbladder wall adjacent to a large mass in the right hepatic lobe. No sonographic Murphy sign noted by sonographer. Common bile duct: Diameter: 1 mm, normal. Liver: There is a 6.2 x 4.7 x 4.0 cm heterogeneous mass with internal vascularity in the right hepatic lobe adjacent to the gallbladder. There is also a 5.5 x 2.1 x 2.9 cm heterogeneous mass with internal vascularity in the left hepatic lobe. Heterogeneous parenchymal echogenicity. Portal vein is patent on color Doppler imaging with normal direction of blood flow towards the liver. IMPRESSION: 1. Large solid-appearing heterogeneous masses in the left and right hepatic lobes. The right hepatic lobe mass may be invading the gallbladder given adjacent gallbladder wall irregularity. Findings are concerning for malignancy. Recommend CT of the abdomen and pelvis with contrast for further evaluation. 2. Cholelithiasis and sludge without sonographic evidence of acute cholecystitis. Electronically Signed   By: Titus Dubin M.D.   On:  12/31/2018 20:04    Review of Systems  Constitutional: Negative for chills, fever and weight loss.  HENT: Negative.   Eyes: Negative.   Respiratory: Negative.   Cardiovascular: Negative.   Gastrointestinal: Positive for abdominal pain, nausea and vomiting.  Genitourinary: Negative.   Musculoskeletal: Positive for back pain.  Skin: Negative.   Neurological: Negative.   Endo/Heme/Allergies: Negative.   Psychiatric/Behavioral: Negative.    Blood pressure 118/69, pulse 81, temperature 98.4 F (36.9 C), temperature source Oral, resp. rate 17, height 5\' 4"  (1.626 m), weight 84.8 kg, SpO2 97 %. Physical Exam  Constitutional: She is oriented to person, place, and time. She appears well-developed and well-nourished. No distress.  HENT:  Head: Normocephalic and atraumatic.  Mouth/Throat: No oropharyngeal exudate.  Eyes: Pupils are equal, round, and reactive to light. Conjunctivae and EOM are normal.  Neck: Normal range of motion. Neck supple.  Cardiovascular: Normal rate, regular rhythm and normal heart sounds.  Respiratory: Effort normal and breath sounds normal. No stridor. No respiratory distress.  GI: Soft. Bowel sounds are normal. There is abdominal tenderness.  Musculoskeletal: Normal range of motion.        General: No tenderness or edema.  Neurological: She is alert and oriented to person, place, and time. Coordination normal.  Skin: Skin is warm and dry. No rash noted.  Psychiatric: She has a normal mood and affect. Her behavior is normal. Thought content normal.    Assessment/Plan: The patient may have what could be symptomatic gallstones.  Unfortunately she also has large tumors throughout the liver.  Because of this finding it is not clear which may be the source of her pain.  Her liver tumors from will need to be worked up prior to considering any sort of surgery.  She will need tumor marker sent and possibly a percutaneous biopsy by interventional radiology.  She may also  benefit from a colonoscopy.  We will follow her closely with you.  Autumn Messing III 01/01/2019, 3:02 AM

## 2019-01-01 NOTE — H&P (Signed)
History and Physical    Summit Borchardt DPO:[id number] DOB: [birth year] DOA: 12/31/2018  PCP: Patient, No Pcp Per   Patient coming from: Home   Chief Complaint: Abdominal pain, N/V   HPI: Jodi Forbes is a 53 y.o. female who denies any significant past medical history, now presenting to the emergency department for evaluation of abdominal pain with nausea and nonbloody vomiting.  The patient reports that she usually enjoys good health, does not drink, smoke, use illicit substances, or take any medications.  She had an episode of upper abdominal pain with nausea over the summer that resolved with Aleve and did not recur until yesterday.  Yesterday she developed pain in the epigastrium and right upper quadrant with nausea and several episodes of nonbloody vomiting.  She denies any diarrhea, fevers, chills, history of liver disease, or exposure to viral hepatitis.  [facility] ED Course: Upon arrival to the ED, patient is found to be afebrile, saturating well on room air, and with vitals otherwise stable.  EKG features a sinus rhythm with nonspecific ST-T abnormality in the lateral leads.  Chest x-ray was notable for low volumes with mild bibasilar atelectasis, but no acute process.  Chemistry panel features transaminases in the 100 range and alkaline phosphatase of 265 with normal bilirubin.  CBC is notable for leukocytosis to 13,300, mild normocytic anemia, and mild thrombocytosis.  Troponin was undetectable x2 and lipase was normal.  Right upper quadrant ultrasound was concerning for large solid-appearing heterogenous masses with concern for possible invasion into the gallbladder.  This was followed by CT of the abdomen and pelvis that features numerous nonspecific liver masses without extrahepatic invasion, possibly metastatic, primary hepatocellular or biliary malignancy, hepatic adenomatosis, or lymphoma.  Patient was given a liter of normal saline, 2 doses IV Dilaudid, Zofran,  and surgery was consulted by the ED physician.  Surgeon recommended a hospitalist admission for further evaluation and management.  Review of Systems:  All other systems reviewed and apart from HPI, are negative.  Past Medical History:  Diagnosis Date  . Medical history non-contributory     History reviewed. No pertinent surgical history.   reports that she has never smoked. She has never used smokeless tobacco. She reports that she does not drink alcohol or use drugs.  No Known Allergies  Family History  Problem Relation Age of Onset  . Diabetes Mother   . CAD Father      Prior to Admission medications   Not on File    Physical Exam: Vitals:   12/31/18 2232 12/31/18 2300 12/31/18 2340 01/01/19 0037  BP: 107/90 (!) 98/57 (!) 96/56 118/69  Pulse: 84 81 96 81  Resp: 18 14 15 17   Temp:    98.4 F (36.9 C)  TempSrc:    Oral  SpO2: 98% 97% 96% 97%  Weight:      Height:         Constitutional: NAD, calm  Eyes: PERTLA, lids and conjunctivae normal ENMT: Mucous membranes are moist. Posterior pharynx clear of any exudate or lesions.   Neck: normal, supple, no masses, no thyromegaly Respiratory: clear to auscultation bilaterally, no wheezing, no crackles. Normal respiratory effort.    Cardiovascular: S1 & S2 heard, regular rate and rhythm. No extremity edema. . Abdomen: No distension, soft, mild upper abdominal tenderness, no rebound pain or guarding. Bowel sounds active.  Musculoskeletal: no clubbing / cyanosis. No joint deformity upper and lower extremities.   Skin: no significant rashes, lesions, ulcers. Warm, dry, well-perfused.  Neurologic: no facial asymmetry. Sensation intact. Moving all extremities.  Psychiatric:  Alert and oriented x 3. Very pleasant and cooperative.    Labs on Admission: I have personally reviewed following labs and imaging studies  CBC: Recent Labs  Lab 12/31/18 1626  WBC 13.3*  HGB 11.1*  HCT 35.4*  MCV 90.8  PLT 419*   Basic  Metabolic Panel: Recent Labs  Lab 12/31/18 1626  NA 137  K 3.5  CL 104  CO2 25  GLUCOSE 111*  BUN 26*  CREATININE 0.63  CALCIUM 9.2   GFR: Estimated Creatinine Clearance: 87.6 mL/min (by C-G formula based on SCr of 0.63 mg/dL). Liver Function Tests: Recent Labs  Lab 12/31/18 2006  AST 95*  ALT 98*  ALKPHOS 265*  BILITOT 0.6  PROT 7.0  ALBUMIN 3.3*   Recent Labs  Lab 12/31/18 1626  LIPASE 25   No results for input(s): AMMONIA in the last 168 hours. Coagulation Profile: No results for input(s): INR, PROTIME in the last 168 hours. Cardiac Enzymes: Recent Labs  Lab 12/31/18 1626 12/31/18 2006  TROPONINI <0.03 <0.03   BNP (last 3 results) No results for input(s): PROBNP in the last 8760 hours. HbA1C: No results for input(s): HGBA1C in the last 72 hours. CBG: No results for input(s): GLUCAP in the last 168 hours. Lipid Profile: No results for input(s): CHOL, HDL, LDLCALC, TRIG, CHOLHDL, LDLDIRECT in the last 72 hours. Thyroid Function Tests: No results for input(s): TSH, T4TOTAL, FREET4, T3FREE, THYROIDAB in the last 72 hours. Anemia Panel: No results for input(s): VITAMINB12, FOLATE, FERRITIN, TIBC, IRON, RETICCTPCT in the last 72 hours. Urine analysis: No results found for: COLORURINE, APPEARANCEUR, LABSPEC, PHURINE, GLUCOSEU, HGBUR, BILIRUBINUR, KETONESUR, PROTEINUR, UROBILINOGEN, NITRITE, LEUKOCYTESUR Sepsis Labs: @LABRCNTIP (procalcitonin:4,lacticidven:4) )No results found for this or any previous visit (from the past 240 hour(s)).   Radiological Exams on Admission: Dg Chest 2 View  Result Date: 12/31/2018 CLINICAL DATA:  Chest pain radiating into back today.  Vomiting. EXAM: CHEST - 2 VIEW COMPARISON:  None. FINDINGS: Heart size is normal. Lung volumes are low. Mild bibasilar atelectasis is present. No significant airspace consolidation is present. There is no edema or effusion. IMPRESSION: Low lung volumes and mild bibasilar atelectasis. No acute  cardiopulmonary disease. Electronically Signed   By: San Morelle M.D.   On: 12/31/2018 16:22   Ct Abdomen Pelvis W Contrast  Result Date: 12/31/2018 CLINICAL DATA:  53 y/o F; epigastric pain radiating to the back with nausea and vomiting for 3 days. EXAM: CT ABDOMEN AND PELVIS WITH CONTRAST TECHNIQUE: Multidetector CT imaging of the abdomen and pelvis was performed using the standard protocol following bolus administration of intravenous contrast. CONTRAST:  132mL ISOVUE-300 IOPAMIDOL (ISOVUE-300) INJECTION 61% COMPARISON:  None. FINDINGS: Lower chest: 8 mm right lower lobe pulmonary nodule (series 4, image 1). Hepatobiliary: Cholelithiasis. No gallbladder wall thickening or biliary ductal dilatation. Numerous liver masses occupying approximately 70% of liver parenchyma measuring up to 9.2 x 9.1 cm in the right lobe of liver (series 2, image 24). Several masses are peripheral and exophytic, however, no extrahepatic invasion is identified. Pancreas: Unremarkable. No pancreatic ductal dilatation or surrounding inflammatory changes. Spleen: Normal in size without focal abnormality. Adrenals/Urinary Tract: Normal adrenal glands. 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 No additional focal kidney lesion, urinary stone disease, hydronephrosis. Normal bladder. Stomach/Bowel: Stomach is within normal limits. Appendix appears normal. No evidence of bowel wall thickening, distention, or inflammatory changes. Vascular/Lymphatic: No significant vascular findings are present.  No enlarged abdominal or pelvic lymph nodes. Reproductive: Multiple uterine fibroids measuring up to 4.2 cm. No adnexal mass identified. Other: No abdominal wall hernia or abnormality. No abdominopelvic ascites. Musculoskeletal: No acute or significant osseous findings. IMPRESSION: 1. Numerous nonspecific liver masses measuring up to 9.2 cm. No extrahepatic invasion is identified. Some differential  considerations include metastasis, primary hepatocellular or biliary malignancy, hepatic adenomatosis, and lymphoma. MRI of the abdomen with and without contrast may help to characterize and tissue sampling is recommended. 2. 8 mm right lower lobe pulmonary nodule. 3. Cholelithiasis.  No findings of acute cholecystitis. 4. Multiple uterine fibroids measuring up to 4.2 cm. 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 Electronically Signed   By: Kristine Garbe M.D.   On: 12/31/2018 21:42   US Abdomen Limited  Result Date: 12/31/2018 CLINICAL DATA:  Sudden onset epigastric pain with nausea and vomiting. EXAM: ULTRASOUND ABDOMEN LIMITED RIGHT UPPER QUADRANT COMPARISON:  None. FINDINGS: Gallbladder: Small gallstones and sludge are noted. No wall thickening, however there is a focal irregularity of the gallbladder wall adjacent to a large mass in the right hepatic lobe. No sonographic Murphy sign noted by sonographer. Common bile duct: Diameter: 1 mm, normal. Liver: There is a 6.2 x 4.7 x 4.0 cm heterogeneous mass with internal vascularity in the right hepatic lobe adjacent to the gallbladder. There is also a 5.5 x 2.1 x 2.9 cm heterogeneous mass with internal vascularity in the left hepatic lobe. Heterogeneous parenchymal echogenicity. Portal vein is patent on color Doppler imaging with normal direction of blood flow towards the liver. IMPRESSION: 1. Large solid-appearing heterogeneous masses in the left and right hepatic lobes. The right hepatic lobe mass may be invading the gallbladder given adjacent gallbladder wall irregularity. Findings are concerning for malignancy. Recommend CT of the abdomen and pelvis with contrast for further evaluation. 2. Cholelithiasis and sludge without sonographic evidence of acute cholecystitis. Electronically Signed   By: Titus Dubin M.D.   On: 12/31/2018 20:04    EKG: Independently reviewed. Sinus rhythm, non-specific ST-T abnormality in lateral leads.    Assessment/Plan  1. Liver masses  - Presents with abdominal pain and N/V  - Found to have large liver masses  - Denies hx of viral hepatitis or liver disease  - Check MRI abd with & w/o, check viral hepatitis panel, continue supportive care    2. Leukocytosis; thrombocytosis  - WBC is 13,300 in ED and platelet count 419,000 - No old labs to compare with  - Likely reactive to #1  - Culture if febrile     DVT prophylaxis: SCD's  Code Status: Full  Family Communication: Discussed with patient  Consults called: Surgery  Admission status: Observation     Vianne Bulls, MD [facility] Pager [phone]  If 7PM-7AM, please contact night-coverage [email or web address] Password [email or web address]  01/01/2019, 2:58 AM

## 2019-01-01 NOTE — Progress Notes (Signed)
Contacted radiologist concerning tachycardia. Instructed to continue to monitor.

## 2019-01-02 ENCOUNTER — Observation Stay (HOSPITAL_COMMUNITY): Payer: Medicaid Other

## 2019-01-02 DIAGNOSIS — K661 Hemoperitoneum: Secondary | ICD-10-CM

## 2019-01-02 DIAGNOSIS — K8020 Calculus of gallbladder without cholecystitis without obstruction: Secondary | ICD-10-CM | POA: Diagnosis not present

## 2019-01-02 DIAGNOSIS — D62 Acute posthemorrhagic anemia: Secondary | ICD-10-CM

## 2019-01-02 DIAGNOSIS — Z7189 Other specified counseling: Secondary | ICD-10-CM

## 2019-01-02 DIAGNOSIS — R1011 Right upper quadrant pain: Secondary | ICD-10-CM | POA: Diagnosis not present

## 2019-01-02 DIAGNOSIS — R109 Unspecified abdominal pain: Secondary | ICD-10-CM

## 2019-01-02 DIAGNOSIS — D649 Anemia, unspecified: Secondary | ICD-10-CM

## 2019-01-02 DIAGNOSIS — R740 Nonspecific elevation of levels of transaminase and lactic acid dehydrogenase [LDH]: Secondary | ICD-10-CM

## 2019-01-02 DIAGNOSIS — R7401 Elevation of levels of liver transaminase levels: Secondary | ICD-10-CM

## 2019-01-02 LAB — CBC
HCT: 23 % — ABNORMAL LOW (ref 36.0–46.0)
Hemoglobin: 6.9 g/dL — CL (ref 12.0–15.0)
MCH: 28.2 pg (ref 26.0–34.0)
MCHC: 30 g/dL (ref 30.0–36.0)
MCV: 93.9 fL (ref 80.0–100.0)
Platelets: 350 10*3/uL (ref 150–400)
RBC: 2.45 MIL/uL — ABNORMAL LOW (ref 3.87–5.11)
RDW: 13.2 % (ref 11.5–15.5)
WBC: 14.9 10*3/uL — ABNORMAL HIGH (ref 4.0–10.5)
nRBC: 0.1 % (ref 0.0–0.2)

## 2019-01-02 LAB — MRSA PCR SCREENING: MRSA by PCR: NEGATIVE

## 2019-01-02 LAB — COMPREHENSIVE METABOLIC PANEL
ALT: 604 U/L — ABNORMAL HIGH (ref 0–44)
ANION GAP: 9 (ref 5–15)
AST: 662 U/L — ABNORMAL HIGH (ref 15–41)
Albumin: 2.7 g/dL — ABNORMAL LOW (ref 3.5–5.0)
Alkaline Phosphatase: 233 U/L — ABNORMAL HIGH (ref 38–126)
BUN: 14 mg/dL (ref 6–20)
CO2: 23 mmol/L (ref 22–32)
Calcium: 7.9 mg/dL — ABNORMAL LOW (ref 8.9–10.3)
Chloride: 106 mmol/L (ref 98–111)
Creatinine, Ser: 0.61 mg/dL (ref 0.44–1.00)
GFR calc Af Amer: >60 mL/min (ref >60)
Glucose, Bld: 124 mg/dL — ABNORMAL HIGH (ref 70–99)
Potassium: 3.4 mmol/L — ABNORMAL LOW (ref 3.5–5.1)
Sodium: 138 mmol/L (ref 135–145)
Total Bilirubin: 0.9 mg/dL (ref 0.3–1.2)
Total Protein: 5.8 g/dL — ABNORMAL LOW (ref 6.5–8.1)

## 2019-01-02 LAB — RETICULOCYTES
Immature Retic Fract: 13.2 % (ref 2.3–15.9)
RBC.: 2.45 MIL/uL — ABNORMAL LOW (ref 3.87–5.11)
Retic Count, Absolute: 50.7 10*3/uL (ref 19.0–186.0)
Retic Ct Pct: 2.1 % (ref 0.4–3.1)

## 2019-01-02 LAB — CEA: CEA: 1.6 ng/mL (ref 0.0–4.7)

## 2019-01-02 LAB — FERRITIN: Ferritin: 776 ng/mL — ABNORMAL HIGH (ref 11–307)

## 2019-01-02 LAB — GLUCOSE, CAPILLARY: Glucose-Capillary: 112 mg/dL — ABNORMAL HIGH (ref 70–99)

## 2019-01-02 LAB — FOLATE: Folate: 12.2 ng/mL (ref >5.9)

## 2019-01-02 LAB — URINE CULTURE: Culture: <10000 — AB

## 2019-01-02 LAB — PREPARE RBC (CROSSMATCH)

## 2019-01-02 LAB — PROTIME-INR
INR: 1.21
Prothrombin Time: 15.2 seconds (ref 11.4–15.2)

## 2019-01-02 LAB — INFLUENZA PANEL BY PCR (TYPE A & B)
INFLBPCR: NEGATIVE
Influenza A By PCR: NEGATIVE

## 2019-01-02 LAB — IRON AND TIBC
Iron: 19 ug/dL — ABNORMAL LOW (ref 28–170)
Saturation Ratios: 4 % — ABNORMAL LOW (ref 10.4–31.8)
TIBC: 533 ug/dL — ABNORMAL HIGH (ref 250–450)
UIBC: 514 ug/dL

## 2019-01-02 LAB — VITAMIN B12: Vitamin B-12: 1467 pg/mL — ABNORMAL HIGH (ref 180–914)

## 2019-01-02 LAB — ABO/RH: ABO/RH(D): O POS

## 2019-01-02 LAB — AFP TUMOR MARKER: AFP, Serum, Tumor Marker: <0.9 ng/mL (ref 0.0–8.3)

## 2019-01-02 LAB — HEMOGLOBIN AND HEMATOCRIT, BLOOD
HCT: 30.2 % — ABNORMAL LOW (ref 36.0–46.0)
Hemoglobin: 9.4 g/dL — ABNORMAL LOW (ref 12.0–15.0)

## 2019-01-02 LAB — HEPATITIS PANEL, ACUTE
HCV Ab: <0.1 s/co ratio (ref 0.0–0.9)
HEP A IGM: NEGATIVE
Hep B C IgM: NEGATIVE
Hepatitis B Surface Ag: NEGATIVE

## 2019-01-02 MED ORDER — PANTOPRAZOLE SODIUM 40 MG IV SOLR
40.0000 mg | Freq: Two times a day (BID) | INTRAVENOUS | Status: DC
  Administered 2019-01-02 – 2019-01-08 (×13): 40 mg via INTRAVENOUS
  Filled 2019-01-02 (×13): qty 40

## 2019-01-02 MED ORDER — POTASSIUM CHLORIDE CRYS ER 20 MEQ PO TBCR
40.0000 meq | EXTENDED_RELEASE_TABLET | Freq: Once | ORAL | Status: AC
  Administered 2019-01-02: 40 meq via ORAL
  Filled 2019-01-02: qty 2

## 2019-01-02 MED ORDER — SODIUM CHLORIDE 0.9 % IV BOLUS
500.0000 mL | Freq: Once | INTRAVENOUS | Status: AC
  Administered 2019-01-02: 500 mL via INTRAVENOUS

## 2019-01-02 MED ORDER — SODIUM CHLORIDE 0.9% IV SOLUTION: Freq: Once | INTRAVENOUS | Status: DC

## 2019-01-02 MED ORDER — ACETAMINOPHEN 325 MG PO TABS
650.0000 mg | ORAL_TABLET | Freq: Once | ORAL | Status: AC
  Administered 2019-01-02: 650 mg via ORAL
  Filled 2019-01-02: qty 2

## 2019-01-02 NOTE — Progress Notes (Signed)
Subjective/Chief Complaint: Patient reports some mild RUQ pain. Tolerated breakfast this morning without nausea or change in her pain Had liver biopsy yesterday   Objective: Vital signs in last 24 hours: Temp:  [97.9 F (36.6 C)-100.4 F (38 C)] 100.4 F (38 C) (01/04 0539) Pulse Rate:  [106-122] 114 (01/04 0816) Resp:  [18-28] 24 (01/04 0539) BP: (88-133)/(57-103) 111/57 (01/04 0816) SpO2:  [89 %-98 %] 97 % (01/04 0816) Last BM Date: 12/31/18  Intake/Output from previous day: 01/03 0701 - 01/04 0700 In: 1646.5 [P.O.:350; I.V.:1013.8; IV Piggyback:282.7] Out: 1575 [Urine:1575] Intake/Output this shift: Total I/O In: 640 [P.O.:640] Out: -   Exam: Awake and alert Appears comfortable Abdomen soft, mildly tender RUQ and epigastrium  Lab Results:  Recent Labs    01/01/19 0528 01/02/19 0525  WBC 13.4* 14.9*  HGB 8.4* 6.9*  HCT 27.7* 23.0*  PLT 400 350   BMET Recent Labs    01/01/19 0528 01/02/19 0525  NA 139 138  K 4.0 3.4*  CL 108 106  CO2 21* 23  GLUCOSE 114* 124*  BUN 26* 14  CREATININE 0.76 0.61  CALCIUM 8.4* 7.9*   PT/INR Recent Labs    01/01/19 0528  LABPROT 15.1  INR 1.20   ABG No results for input(s): PHART, HCO3 in the last 72 hours.  Invalid input(s): PCO2, PO2  Studies/Results: Dg Chest 2 View  Result Date: 12/31/2018 CLINICAL DATA:  Chest pain radiating into back today.  Vomiting. EXAM: CHEST - 2 VIEW COMPARISON:  None. FINDINGS: Heart size is normal. Lung volumes are low. Mild bibasilar atelectasis is present. No significant airspace consolidation is present. There is no edema or effusion. IMPRESSION: Low lung volumes and mild bibasilar atelectasis. No acute cardiopulmonary disease. Electronically Signed   By: San Morelle M.D.   On: 12/31/2018 16:22   Ct Chest W Contrast  Result Date: 01/01/2019 CLINICAL DATA:  Midsternal chest pain. Multiple large liver lesions on previous recent imaging. EXAM: CT CHEST WITH CONTRAST  TECHNIQUE: Multidetector CT imaging of the chest was performed during intravenous contrast administration. CONTRAST:  81mL ISOVUE-300 IOPAMIDOL (ISOVUE-300) INJECTION 61% COMPARISON:  None. FINDINGS: Cardiovascular: Heart size upper normal. No pericardial effusion. No thoracic aortic aneurysm. Mediastinum/Nodes: Small mediastinal lymph nodes are evident without mediastinal lymphadenopathy. There is no hilar lymphadenopathy. The esophagus has normal imaging features. There is no axillary lymphadenopathy. Lungs/Pleura: The central tracheobronchial airways are patent. 5 mm right upper lobe ground-glass nodule identified on 48/5. 3 mm left upper lobe nodule (27/5) may be cavitary. Ill-defined 8 mm nodule identified central left upper lobe on 45/5. Dependent atelectasis noted in the lower lobes bilaterally and associated airspace infection could not be excluded. There is no evidence for pleural effusion. Upper Abdomen: Large posterior right hepatic mass noted with other smaller hepatic lesions evident, better characterized on previous imaging. Musculoskeletal: No worrisome lytic or sclerotic osseous abnormality. IMPRESSION: 1. No evidence for primary malignancy in the thorax. 2. Scattered tiny bilateral pulmonary nodules are too small to characterize and nonspecific. Metastatic disease could certainly have this appearance and close attention on follow-up recommended. 3. Known hepatic lesions again noted. Electronically Signed   By: Misty Stanley M.D.   On: 01/01/2019 13:18   Mr Abdomen W Wo Contrast  Result Date: 01/01/2019 CLINICAL DATA:  Followup multiple liver lesions. EXAM: MRI ABDOMEN WITHOUT AND WITH CONTRAST TECHNIQUE: Multiplanar multisequence MR imaging of the abdomen was performed both before and after the administration of intravenous contrast. CONTRAST:  10 cc Gadavist COMPARISON:  Ultrasound abdomen, 12/31/2018 and CT abdomen 12/31/2018. FINDINGS: Lower chest: Bibasilar atelectasis is noted. No obvious  pulmonary lesions. An 8 mm right lower lobe pulmonary nodule was noted on the CT scan. The distal esophagus is grossly normal. Hepatobiliary: As demonstrated on the ultrasound and CT scan there are innumerable large masses throughout both lobes of the liver. 8 cm segment 8 liver lesion appears to have a central scar. Similar appearance of the 9 cm segment 3 lesion which has central necrosis and hemorrhage. 9.5 cm segment 6 lesion demonstrates extensive necrosis with variable areas of hemorrhage. Gallstones are again noted in the gallbladder but no definite findings for acute cholecystitis. Pancreas:  No mass, inflammation or ductal dilatation. Spleen:  Normal size.  No focal lesions. Adrenals/Urinary Tract: The adrenal glands and kidneys are unremarkable. Simple left renal cyst is noted. Stomach/Bowel: No evidence of a gastric mass. The small bowel and colon are grossly normal. No obvious colon cancer is demonstrated on this exam or the CT scan. Vascular/Lymphatic: The aorta and branch vessels are patent. The major venous structures are patent. No mesenteric mass or adenopathy. No retroperitoneal adenopathy. Other:  No ascites or abdominal wall hernia. Musculoskeletal: No significant bony findings. IMPRESSION: 1. Numerous large masses throughout both lobes of the liver, likely metastatic disease but possible multifocal primary hepatic tumors. No obvious primary neoplasm is identified. Specifically, I do not see any evidence of a gastric or pancreatic tumor or colon cancer. No obvious breast masses. 2. Recommend consideration for ultrasound-guided liver biopsy and chest CT to exclude a pulmonary neoplasm. Electronically Signed   By: Marijo Sanes M.D.   On: 01/01/2019 09:29   Ct Abdomen Pelvis W Contrast  Result Date: 12/31/2018 CLINICAL DATA:  53 y/o F; epigastric pain radiating to the back with nausea and vomiting for 3 days. EXAM: CT ABDOMEN AND PELVIS WITH CONTRAST TECHNIQUE: Multidetector CT imaging of the  abdomen and pelvis was performed using the standard protocol following bolus administration of intravenous contrast. CONTRAST:  136mL ISOVUE-300 IOPAMIDOL (ISOVUE-300) INJECTION 61% COMPARISON:  None. FINDINGS: Lower chest: 8 mm right lower lobe pulmonary nodule (series 4, image 1). Hepatobiliary: Cholelithiasis. No gallbladder wall thickening or biliary ductal dilatation. Numerous liver masses occupying approximately 70% of liver parenchyma measuring up to 9.2 x 9.1 cm in the right lobe of liver (series 2, image 24). Several masses are peripheral and exophytic, however, no extrahepatic invasion is identified. Pancreas: Unremarkable. No pancreatic ductal dilatation or surrounding inflammatory changes. Spleen: Normal in size without focal abnormality. Adrenals/Urinary Tract: Normal adrenal glands. 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 No additional focal kidney lesion, urinary stone disease, hydronephrosis. Normal bladder. Stomach/Bowel: Stomach is within normal limits. Appendix appears normal. No evidence of bowel wall thickening, distention, or inflammatory changes. Vascular/Lymphatic: No significant vascular findings are present. No enlarged abdominal or pelvic lymph nodes. Reproductive: Multiple uterine fibroids measuring up to 4.2 cm. No adnexal mass identified. Other: No abdominal wall hernia or abnormality. No abdominopelvic ascites. Musculoskeletal: No acute or significant osseous findings. IMPRESSION: 1. Numerous nonspecific liver masses measuring up to 9.2 cm. No extrahepatic invasion is identified. Some differential considerations include metastasis, primary hepatocellular or biliary malignancy, hepatic adenomatosis, and lymphoma. MRI of the abdomen with and without contrast may help to characterize and tissue sampling is recommended. 2. 8 mm right lower lobe pulmonary nodule. 3. Cholelithiasis.  No findings of acute cholecystitis. 4. Multiple uterine fibroids  measuring up to 4.2 cm. 5. 8 mm left kidney interpolar  angiomyolipoma. Electronically Signed   By: Kristine Garbe M.D.   On: 12/31/2018 21:42   US Abdomen Limited  Result Date: 12/31/2018 CLINICAL DATA:  Sudden onset epigastric pain with nausea and vomiting. EXAM: ULTRASOUND ABDOMEN LIMITED RIGHT UPPER QUADRANT COMPARISON:  None. FINDINGS: Gallbladder: Small gallstones and sludge are noted. No wall thickening, however there is a focal irregularity of the gallbladder wall adjacent to a large mass in the right hepatic lobe. No sonographic Murphy sign noted by sonographer. Common bile duct: Diameter: 1 mm, normal. Liver: There is a 6.2 x 4.7 x 4.0 cm heterogeneous mass with internal vascularity in the right hepatic lobe adjacent to the gallbladder. There is also a 5.5 x 2.1 x 2.9 cm heterogeneous mass with internal vascularity in the left hepatic lobe. Heterogeneous parenchymal echogenicity. Portal vein is patent on color Doppler imaging with normal direction of blood flow towards the liver. IMPRESSION: 1. Large solid-appearing heterogeneous masses in the left and right hepatic lobes. The right hepatic lobe mass may be invading the gallbladder given adjacent gallbladder wall irregularity. Findings are concerning for malignancy. Recommend CT of the abdomen and pelvis with contrast for further evaluation. 2. Cholelithiasis and sludge without sonographic evidence of acute cholecystitis. Electronically Signed   By: Titus Dubin M.D.   On: 12/31/2018 20:04   Ir US Guide Bx Asp/drain  Result Date: 01/01/2019 INDICATION: No known primary, now with multiple liver lesions and masses worrisome for metastatic disease. Please from ultrasound-guided liver mass biopsy for tissue diagnostic purposes. EXAM: ULTRASOUND GUIDED LIVER LESION BIOPSY COMPARISON:  CT abdomen and pelvis - 12/31/2018; abdominal MRI - 01/01/2019 MEDICATIONS: None ANESTHESIA/SEDATION: Fentanyl 75 mcg IV; Versed 1.5 mg IV Total Moderate Sedation  time:  13 Minutes. The patient's level of consciousness and vital signs were monitored continuously by radiology nursing throughout the procedure under my direct supervision. COMPLICATIONS: None immediate. PROCEDURE: Informed written consent was obtained from the patient after a discussion of the risks, benefits and alternatives to treatment. The patient understands and consents the procedure. A timeout was performed prior to the initiation of the procedure. Ultrasound scanning was performed of the right upper abdominal quadrant demonstrates multiple mixed echogenic liver lesions and masses compatible with findings seen on preceding abdominal CT and MRI. Dominant lesion within the posterior aspect the right lobe of the liver was targeted for biopsy given lesion location and sonographic window. The procedure was planned. The right upper abdominal quadrant was prepped and draped in the usual sterile fashion. The overlying soft tissues were anesthetized with 1% lidocaine with epinephrine. A 17 gauge, 6.8 cm co-axial needle was advanced into a peripheral aspect of the lesion. This was followed by 5 core biopsies with an 18 gauge core device under direct ultrasound guidance. The coaxial needle tract was embolized with a small amount of Gel-Foam slurry and superficial hemostasis was obtained with manual compression. Post procedural scanning was negative for definitive area of hemorrhage or additional complication. A dressing was placed. The patient tolerated the procedure well without immediate post procedural complication. IMPRESSION: Technically successful ultrasound guided core needle biopsy of dominant mass within the posterior segment of the right lobe of the liver. Electronically Signed   By: Sandi Mariscal M.D.   On: 01/01/2019 17:34    Anti-infectives: Anti-infectives (From admission, onward)   Start     Dose/Rate Route Frequency Ordered Stop   01/01/19 1800  Ampicillin-Sulbactam (UNASYN) 3 g in sodium chloride  0.9 % 100 mL IVPB     3 g 200  mL/hr over 30 Minutes Intravenous Every 6 hours 01/01/19 1736        Assessment/Plan: Liver masses  Doubt cholecystitis given ultrasound and MRI.  Unsure if a HIDA scan would even work given liver masses. CEA, AFP normal. Liver biopsy pending  Surgery will see again Monday.  Right now, no plans for cholecystectomy.  LOS: 0 days    Coralie Keens 01/02/2019

## 2019-01-02 NOTE — Consult Note (Signed)
[facility] CONSULT NOTE  Patient Care Team: Patient, No Pcp Per as PCP - General (General Practice)  HEME/ONC OVERVIEW: 1. 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 and possible lung mets -I reviewed the patient's records in detail, including hospital course and lab studies -I also independently reviewed radiologic images of CT CAP and MRI abdomen, and agree with the findings as documented -In summary, patient presented to the ER with persistent abdominal pain, and ultrasound and CT abdomen showed extensive liver metastases, largest measuring ~9 cm, occupying ~70% of the liver; CT chest showed bilateral tiny pulmonary nodules, nonspecific, possibly metastases -Patient underwent liver biopsy on 01/28/2019, results pending -Pending the biopsy results, we will be able to discuss treatment options and prognosis  Intralesional hepatic hemorrhage and new hemiperitoneum -Due to progressive anemia, CT abdomen/pelvis was done 01/02/2019, which showed new intralesional hepatic hemorrhage and new small to moderate hemi-peritoneum -I recommend consulting general surgery for further evaluation -If no surgical intervention is recommended, I would recommend consulting IR for possible embolization  Progressive normocytic anemia -Hemoglobin 11.1 on admission, which steadily down trended over the past 2 days to 6.9 today -Iron profile suggestive of anemia of chronic disease, with possible concurrent iron deficiency anemia; B12  and folate normal -CT abdomen/pelvis results as above -I recommend supportive transfusion to keep Hgb > 7  Transaminitis -LFTs mildly elevated on admission, but has deteriorated significantly -Possibly due to liver injury due to recent liver biopsy; other etiologies include liver decompensation from extensive liver metastases and medications -Due to the worsening transaminitis, I recommend discontinuing Tylenol -GI consult to further evaluate the etiology of transaminitis  Abdominal pain -Patient is currently on several opioid medications -I recommend consulting palliative care for pain control -I will discuss the case with radiation oncology regarding the role for palliative RT to the large liver mets, which may also provide some pain relief  Goals of care discussion -I reviewed the imaging results in detail with the patient and her extended family (grandmother, sister, aunt, and daughter) -I explained to the patient that based on imaging results, she most likely has Stage IV or metastatic disease, which is not curable but potentially treatable -Furthermore, given her extensive liver metastases replacing nearly 70% of the liver, this may affect her treatment regimen and expected toxicities, as most chemotherapies are metabolized by the liver -Patient felt overwhelmed by the new information, including the Stage IV diagnosis; I spent some time offering emotional support to the patient and her family -Pending the biopsy results, will be with further discuss treatment options and prognosis  All questions were answered. The patient knows to call the clinic with any problems, questions or concerns.  Thank you for the consult.  Please not hesitate to contact me if any questions.  Tish Men, MD 01/02/2019 10:34 AM   PURPOSE OF CONSULTATION:  Extensive liver metastases with unknown primary  HISTORY OF PRESENTING ILLNESS:  Jodi Forbes is a 53 year old female with no significant past medical  history who presented to [facility] ED for worsening upper abdominal pain,  associated with nausea and nonbloody/nonbilious emesis for few days.  She had occasional abdominal pain over the past few months, which resolved with PRN Aleve.  She denied any fever, chill, night sweats, unintentional weight loss, or lymphadenopathy.  In the ER, abdominal ultrasound showed a large heterogeneous mass in the liver with possible invasion into the gallbladder.  Subsequent CT abdomen/pelvis showed numerous liver masses, largest measuring approximately 9 cm, altogether occupying approximately 70% of the liver.  The patient was admitted to the hospital service, and subsequently underwent liver biopsy on 01/01/2019, results pending.  CT chest showed nonspecific tiny bilateral pulmonary nodules, possibly metastatic disease.   On 01/02/2019, patient was found to be somnolent and tachycardic, for which a rapid response team was called, and the patient was transferred to the stepdown unit for close observation.  CT abdomen/pelvis showed new intralesional hepatic hemorrhage and new small to moderate hemoperitoneum.  Patient reports that she has persistent upper abdominal quadrant pain, 4-5 out of 10, nonradiating, but denies any other complaint at this time.  MEDICAL HISTORY:  Past Medical History:  Diagnosis Date  . Medical history non-contributory     SURGICAL HISTORY: Past Surgical History:  Procedure Laterality Date  . IR US GUIDE BX ASP/DRAIN  01/01/2019    SOCIAL HISTORY: Social History   Socioeconomic History  . Marital status: Single    Spouse name: Not on file  . Number of children: Not on file  . Years of education: Not on file  . Highest education level: Not on file  Occupational History  . Not on file  Social Needs  . Financial resource strain: Not on file  . Food insecurity:    Worry: Not on file    Inability: Not on file  . Transportation needs:    Medical: Not on file     Non-medical: Not on file  Tobacco Use  . Smoking status: Never Smoker  . Smokeless tobacco: Never Used  Substance and Sexual Activity  . Alcohol use: Never    Frequency: Never  . Drug use: Never  . Sexual activity: Not on file  Lifestyle  . Physical activity:    Days per week: Not on file    Minutes per session: Not on file  . Stress: Not on file  Relationships  . Social connections:    Talks on phone: Not on file    Gets together: Not on file    Attends religious service: Not on file    Active member of club or organization: Not on file    Attends meetings of clubs or organizations: Not on file    Relationship status: Not on file  . Intimate partner violence:    Fear of current or ex partner: Not on file    Emotionally abused: Not on file    Physically abused: Not on file    Forced sexual activity: Not on file  Other Topics Concern  . Not on file  Social History Narrative  . Not on file    FAMILY HISTORY: Family History  Problem Relation Age of Onset  . Diabetes Mother   . CAD Father     ALLERGIES:  has No Known Allergies.  MEDICATIONS:  Current Facility-Administered Medications  Medication Dose Route Frequency Provider Last Rate Last Dose  . 0.9 %  sodium chloride infusion (Manually program via Guardrails IV Fluids)   Intravenous Once Regalado, Belkys A, MD      . 0.9 %  sodium chloride infusion   Intravenous  Continuous Regalado, Belkys A, MD 100 mL/hr at 01/02/19 0605    . acetaminophen (TYLENOL) tablet 650 mg  650 mg Oral Q6H PRN Regalado, Belkys A, MD   650 mg at 01/02/19 0541   Or  . acetaminophen (TYLENOL) suppository 650 mg  650 mg Rectal Q6H PRN Regalado, Belkys A, MD      . Ampicillin-Sulbactam (UNASYN) 3 g in sodium chloride 0.9 % 100 mL IVPB  3 g Intravenous Q6H Regalado, Belkys A, MD   Stopped at 01/02/19 3762  . fentaNYL (SUBLIMAZE) injection 25-50 mcg  25-50 mcg Intravenous Q2H PRN Regalado, Belkys A, MD   50 mcg at 01/02/19 0537  . ondansetron  (ZOFRAN) tablet 4 mg  4 mg Oral Q6H PRN Regalado, Belkys A, MD       Or  . ondansetron (ZOFRAN) injection 4 mg  4 mg Intravenous Q6H PRN Regalado, Belkys A, MD      . pantoprazole (PROTONIX) injection 40 mg  40 mg Intravenous Q12H Regalado, Belkys A, MD      . polyethylene glycol (MIRALAX / GLYCOLAX) packet 17 g  17 g Oral Daily Regalado, Belkys A, MD   17 g at 01/01/19 2038  . potassium chloride SA (K-DUR,KLOR-CON) CR tablet 40 mEq  40 mEq Oral Once Regalado, Belkys A, MD      . senna-docusate (Senokot-S) tablet 1 tablet  1 tablet Oral QHS PRN Regalado, Belkys A, MD      . sodium chloride 0.9 % bolus 500 mL  500 mL Intravenous Once Regalado, Belkys A, MD        REVIEW OF SYSTEMS:   Constitutional: ( - ) fevers, ( - )  chills , ( - ) night sweats Eyes: ( - ) blurriness of vision, ( - ) double vision, ( - ) watery eyes Ears, nose, mouth, throat, and face: ( - ) mucositis, ( - ) sore throat Respiratory: ( - ) cough, ( - ) dyspnea, ( - ) wheezes Cardiovascular: ( - ) palpitation, ( - ) chest discomfort, ( - ) lower extremity swelling Gastrointestinal:  ( - ) nausea, ( - ) heartburn, ( - ) change in bowel habits Skin: ( - ) abnormal skin rashes Lymphatics: ( - ) new lymphadenopathy, ( - ) easy bruising Neurological: ( - ) numbness, ( - ) tingling, ( - ) new weaknesses Behavioral/Psych: ( - ) mood change, ( - ) new changes  All other systems were reviewed with the patient and are negative.  PHYSICAL EXAMINATION: ECOG PERFORMANCE STATUS: 1 - Symptomatic but completely ambulatory  Vitals:   01/02/19 0816 01/02/19 0920  BP: (!) 111/57 119/67  Pulse: (!) 114 (!) 115  Resp: (!) 27   Temp: (!) 101.6 F (38.7 C)   SpO2: 97% 100%   Filed Weights   12/31/18 1555  Weight: 187 lb (84.8 kg)    GENERAL: alert, no distress and comfortable SKIN: skin color, texture, turgor are normal, no rashes or significant lesions EYES: conjunctiva are pink and non-injected, sclera clear OROPHARYNX: no  exudate, no erythema; lips, buccal mucosa, and tongue normal  NECK: supple, non-tender LUNGS: clear to auscultation and percussion with normal breathing effort HEART: regular rate & rhythm, no murmurs, no lower extremity edema ABDOMEN: soft, mild to moderate tenderness with palpation in the upper abdominal quadrants, mildly distended, normal bowel sounds Musculoskeletal: no cyanosis of digits and no clubbing  PSYCH: alert & oriented x 3, fluent speech NEURO: no focal motor/sensory deficits  LABORATORY DATA:  I have reviewed the data as listed Lab Results  Component Value Date   WBC 14.9 (H) 01/02/2019   HGB 6.9 (LL) 01/02/2019   HCT 23.0 (L) 01/02/2019   MCV 93.9 01/02/2019   PLT 350 01/02/2019   Lab Results  Component Value Date   NA 138 01/02/2019   K 3.4 (L) 01/02/2019   CL 106 01/02/2019   CO2 23 01/02/2019    RADIOGRAPHIC STUDIES: I have personally reviewed the radiological images as listed and agreed with the findings in the report. Dg Chest 2 View  Result Date: 12/31/2018 CLINICAL DATA:  Chest pain radiating into back today.  Vomiting. EXAM: CHEST - 2 VIEW COMPARISON:  None. FINDINGS: Heart size is normal. Lung volumes are low. Mild bibasilar atelectasis is present. No significant airspace consolidation is present. There is no edema or effusion. IMPRESSION: Low lung volumes and mild bibasilar atelectasis. No acute cardiopulmonary disease. Electronically Signed   By: San Morelle M.D.   On: 12/31/2018 16:22   Ct Chest W Contrast  Result Date: 01/01/2019 CLINICAL DATA:  Midsternal chest pain. Multiple large liver lesions on previous recent imaging. EXAM: CT CHEST WITH CONTRAST TECHNIQUE: Multidetector CT imaging of the chest was performed during intravenous contrast administration. CONTRAST:  62mL ISOVUE-300 IOPAMIDOL (ISOVUE-300) INJECTION 61% COMPARISON:  None. FINDINGS: Cardiovascular: Heart size upper normal. No pericardial effusion. No thoracic aortic aneurysm.  Mediastinum/Nodes: Small mediastinal lymph nodes are evident without mediastinal lymphadenopathy. There is no hilar lymphadenopathy. The esophagus has normal imaging features. There is no axillary lymphadenopathy. Lungs/Pleura: The central tracheobronchial airways are patent. 5 mm right upper lobe ground-glass nodule identified on 48/5. 3 mm left upper lobe nodule (27/5) may be cavitary. Ill-defined 8 mm nodule identified central left upper lobe on 45/5. Dependent atelectasis noted in the lower lobes bilaterally and associated airspace infection could not be excluded. There is no evidence for pleural effusion. Upper Abdomen: Large posterior right hepatic mass noted with other smaller hepatic lesions evident, better characterized on previous imaging. Musculoskeletal: No worrisome lytic or sclerotic osseous abnormality. IMPRESSION: 1. No evidence for primary malignancy in the thorax. 2. Scattered tiny bilateral pulmonary nodules are too small to characterize and nonspecific. Metastatic disease could certainly have this appearance and close attention on follow-up recommended. 3. Known hepatic lesions again noted. Electronically Signed   By: Misty Stanley M.D.   On: 01/01/2019 13:18   Mr Abdomen W Wo Contrast  Result Date: 01/01/2019 CLINICAL DATA:  Followup multiple liver lesions. EXAM: MRI ABDOMEN WITHOUT AND WITH CONTRAST TECHNIQUE: Multiplanar multisequence MR imaging of the abdomen was performed both before and after the administration of intravenous contrast. CONTRAST:  10 cc Gadavist COMPARISON:  Ultrasound abdomen, 12/31/2018 and CT abdomen 12/31/2018. FINDINGS: Lower chest: Bibasilar atelectasis is noted. No obvious pulmonary lesions. An 8 mm right lower lobe pulmonary nodule was noted on the CT scan. The distal esophagus is grossly normal. Hepatobiliary: As demonstrated on the ultrasound and CT scan there are innumerable large masses throughout both lobes of the liver. 8 cm segment 8 liver lesion appears to  have a central scar. Similar appearance of the 9 cm segment 3 lesion which has central necrosis and hemorrhage. 9.5 cm segment 6 lesion demonstrates extensive necrosis with variable areas of hemorrhage. Gallstones are again noted in the gallbladder but no definite findings for acute cholecystitis. Pancreas:  No mass, inflammation or ductal dilatation. Spleen:  Normal size.  No focal lesions. Adrenals/Urinary Tract: The adrenal glands and kidneys are unremarkable. Simple left renal  cyst is noted. Stomach/Bowel: No evidence of a gastric mass. The small bowel and colon are grossly normal. No obvious colon cancer is demonstrated on this exam or the CT scan. Vascular/Lymphatic: The aorta and branch vessels are patent. The major venous structures are patent. No mesenteric mass or adenopathy. No retroperitoneal adenopathy. Other:  No ascites or abdominal wall hernia. Musculoskeletal: No significant bony findings. IMPRESSION: 1. Numerous large masses throughout both lobes of the liver, likely metastatic disease but possible multifocal primary hepatic tumors. No obvious primary neoplasm is identified. Specifically, I do not see any evidence of a gastric or pancreatic tumor or colon cancer. No obvious breast masses. 2. Recommend consideration for ultrasound-guided liver biopsy and chest CT to exclude a pulmonary neoplasm. Electronically Signed   By: Marijo Sanes M.D.   On: 01/01/2019 09:29   Ct Abdomen Pelvis W Contrast  Result Date: 12/31/2018 CLINICAL DATA:  53 y/o F; epigastric pain radiating to the back with nausea and vomiting for 3 days. EXAM: CT ABDOMEN AND PELVIS WITH CONTRAST TECHNIQUE: Multidetector CT imaging of the abdomen and pelvis was performed using the standard protocol following bolus administration of intravenous contrast. CONTRAST:  120mL ISOVUE-300 IOPAMIDOL (ISOVUE-300) INJECTION 61% COMPARISON:  None. FINDINGS: Lower chest: 8 mm right lower lobe pulmonary nodule (series 4, image 1). Hepatobiliary:  Cholelithiasis. No gallbladder wall thickening or biliary ductal dilatation. Numerous liver masses occupying approximately 70% of liver parenchyma measuring up to 9.2 x 9.1 cm in the right lobe of liver (series 2, image 24). Several masses are peripheral and exophytic, however, no extrahepatic invasion is identified. Pancreas: Unremarkable. No pancreatic ductal dilatation or surrounding inflammatory changes. Spleen: Normal in size without focal abnormality. Adrenals/Urinary Tract: Normal adrenal glands. 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 No additional focal kidney lesion, urinary stone disease, hydronephrosis. Normal bladder. Stomach/Bowel: Stomach is within normal limits. Appendix appears normal. No evidence of bowel wall thickening, distention, or inflammatory changes. Vascular/Lymphatic: No significant vascular findings are present. No enlarged abdominal or pelvic lymph nodes. Reproductive: Multiple uterine fibroids measuring up to 4.2 cm. No adnexal mass identified. Other: No abdominal wall hernia or abnormality. No abdominopelvic ascites. Musculoskeletal: No acute or significant osseous findings. IMPRESSION: 1. Numerous nonspecific liver masses measuring up to 9.2 cm. No extrahepatic invasion is identified. Some differential considerations include metastasis, primary hepatocellular or biliary malignancy, hepatic adenomatosis, and lymphoma. MRI of the abdomen with and without contrast may help to characterize and tissue sampling is recommended. 2. 8 mm right lower lobe pulmonary nodule. 3. Cholelithiasis.  No findings of acute cholecystitis. 4. Multiple uterine fibroids measuring up to 4.2 cm. 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 Electronically Signed   By: Kristine Garbe M.D.   On: 12/31/2018 21:42   US Abdomen Limited  Result Date: 12/31/2018 CLINICAL DATA:  Sudden onset epigastric pain with nausea and vomiting. EXAM: ULTRASOUND ABDOMEN  LIMITED RIGHT UPPER QUADRANT COMPARISON:  None. FINDINGS: Gallbladder: Small gallstones and sludge are noted. No wall thickening, however there is a focal irregularity of the gallbladder wall adjacent to a large mass in the right hepatic lobe. No sonographic Murphy sign noted by sonographer. Common bile duct: Diameter: 1 mm, normal. Liver: There is a 6.2 x 4.7 x 4.0 cm heterogeneous mass with internal vascularity in the right hepatic lobe adjacent to the gallbladder. There is also a 5.5 x 2.1 x 2.9 cm heterogeneous mass with internal vascularity in the left hepatic lobe. Heterogeneous parenchymal echogenicity. Portal vein is patent  on color Doppler imaging with normal direction of blood flow towards the liver. IMPRESSION: 1. Large solid-appearing heterogeneous masses in the left and right hepatic lobes. The right hepatic lobe mass may be invading the gallbladder given adjacent gallbladder wall irregularity. Findings are concerning for malignancy. Recommend CT of the abdomen and pelvis with contrast for further evaluation. 2. Cholelithiasis and sludge without sonographic evidence of acute cholecystitis. Electronically Signed   By: Titus Dubin M.D.   On: 12/31/2018 20:04   Ir US Guide Bx Asp/drain  Result Date: 01/01/2019 INDICATION: No known primary, now with multiple liver lesions and masses worrisome for metastatic disease. Please from ultrasound-guided liver mass biopsy for tissue diagnostic purposes. EXAM: ULTRASOUND GUIDED LIVER LESION BIOPSY COMPARISON:  CT abdomen and pelvis - 12/31/2018; abdominal MRI - 01/01/2019 MEDICATIONS: None ANESTHESIA/SEDATION: Fentanyl 75 mcg IV; Versed 1.5 mg IV Total Moderate Sedation time:  13 Minutes. The patient's level of consciousness and vital signs were monitored continuously by radiology nursing throughout the procedure under my direct supervision. COMPLICATIONS: None immediate. PROCEDURE: Informed written consent was obtained from the patient after a discussion of the  risks, benefits and alternatives to treatment. The patient understands and consents the procedure. A timeout was performed prior to the initiation of the procedure. Ultrasound scanning was performed of the right upper abdominal quadrant demonstrates multiple mixed echogenic liver lesions and masses compatible with findings seen on preceding abdominal CT and MRI. Dominant lesion within the posterior aspect the right lobe of the liver was targeted for biopsy given lesion location and sonographic window. The procedure was planned. The right upper abdominal quadrant was prepped and draped in the usual sterile fashion. The overlying soft tissues were anesthetized with 1% lidocaine with epinephrine. A 17 gauge, 6.8 cm co-axial needle was advanced into a peripheral aspect of the lesion. This was followed by 5 core biopsies with an 18 gauge core device under direct ultrasound guidance. The coaxial needle tract was embolized with a small amount of Gel-Foam slurry and superficial hemostasis was obtained with manual compression. Post procedural scanning was negative for definitive area of hemorrhage or additional complication. A dressing was placed. The patient tolerated the procedure well without immediate post procedural complication. IMPRESSION: Technically successful ultrasound guided core needle biopsy of dominant mass within the posterior segment of the right lobe of the liver. Electronically Signed   By: Sandi Mariscal M.D.   On: 01/01/2019 17:34    PATHOLOGY: I have reviewed the pathology reports as documented in the oncologist history.

## 2019-01-02 NOTE — Progress Notes (Signed)
Referring Physician(s): Dr Rudolpho Sevin  Supervising Physician: Aletta Edouard  Patient Status:  [facility] - In-pt  Chief Complaint:  1/3 IR Procedure:  Technically successful US guided biopsy of Indeterminate lesion within the right lobe of the liver.   Subjective:  Change in Hg and tachy  Nausea Sluggish to RN  CT this am:  IMPRESSION: 1. Positive for new intralesional hemorrhage and new small-moderate hemoperitoneum layering in the anatomic pelvis. Findings likely represent bleeding from the recent biopsy site. 2. Slightly increased bilateral lower lobe atelectasis. 3. Otherwise, no significant interval change compared to recent prior imaging  I have seen and examined pt She does endorse abd pain But says this pain is same as what brought her to hospital    Allergies: Patient has no known allergies.  Medications: Prior to Admission medications   Not on File     Vital Signs: BP 133/70   Pulse (!) 117   Temp 99.8 F (37.7 C) (Oral)   Resp (!) 29   Ht 5\' 4"  (1.626 m)   Wt 193 lb 12.6 oz (87.9 kg)   SpO2 95%   BMI 33.26 kg/m   Physical Exam Cardiovascular:     Rate and Rhythm: Tachycardia present.  Pulmonary:     Effort: Pulmonary effort is normal.  Abdominal:     Palpations: Abdomen is soft.     Tenderness: There is abdominal tenderness.  Musculoskeletal: Normal range of motion.  Skin:    General: Skin is warm and dry.  Neurological:     General: No focal deficit present.     Mental Status: She is alert and oriented to person, place, and time.  Psychiatric:        Mood and Affect: Mood normal.        Behavior: Behavior normal.     Imaging: Ct Abdomen Pelvis Wo Contrast  Addendum Date: 01/02/2019   ADDENDUM REPORT: 01/02/2019 10:53 ADDENDUM: These results were called by telephone at the time of interpretation on 01/02/2019 at 10:43 am to Dr. Niel Hummer , who verbally acknowledged these results. Electronically Signed   By: Jacqulynn Cadet  M.D.   On: 01/02/2019 10:53   Result Date: 01/02/2019 CLINICAL DATA:  53 year old female with lethargy, decreased arousal and new onset anemia. Patient underwent liver biopsy yesterday. Evaluate for hemorrhage. EXAM: CT ABDOMEN AND PELVIS WITHOUT CONTRAST TECHNIQUE: Multidetector CT imaging of the abdomen and pelvis was performed following the standard protocol without IV contrast. COMPARISON:  Recent prior CT scan of the abdomen and pelvis 12/31/2018; MRI of the abdomen 01/01/2019 and CT scan of the chest also 01/01/2019 FINDINGS: Lower chest: Increased round atelectasis in the posterior aspect of the right lower lobe persistent left lower lobe atelectasis. Trace right pleural effusion. The intracardiac blood pool is hypodense relative to the adjacent myocardium consistent with anemia. Hepatobiliary: Hepatomegaly with multiple hepatic masses as previously seen. There is new high attenuation within the mass in the posterior aspect of the right hemi liver as well as several small foci of gas likely representing the site of prior biopsy complicated by some intralesional hemorrhage. Gallstones again noted within the gallbladder. Pancreas: Unremarkable. No pancreatic ductal dilatation or surrounding inflammatory changes. Spleen: Normal in size without focal abnormality. Adrenals/Urinary Tract: Normal adrenal glands. No evidence of hydronephrosis or nephrolithiasis. 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 Stomach/Bowel: No focal bowel wall thickening or evidence of obstruction. Normal appendix in the right lower quadrant.  Vascular/Lymphatic: Limited evaluation in the absence of intravenous contrast. No evidence of aneurysm. Reproductive: Dystrophic calcified uterine fibroids. No adnexal masses. Other: Interval development of moderate hemoperitoneum predominantly layering in the anatomic pelvis with a trace amount in the pericolic gutters.  Musculoskeletal: No acute fracture or aggressive appearing lytic or blastic osseous lesion. IMPRESSION: 1. Positive for new intralesional hemorrhage and new small-moderate hemoperitoneum layering in the anatomic pelvis. Findings likely represent bleeding from the recent biopsy site. 2. Slightly increased bilateral lower lobe atelectasis. 3. Otherwise, no significant interval change compared to recent prior imaging. Electronically Signed: By: Jacqulynn Cadet M.D. On: 01/02/2019 10:32   Dg Chest 2 View  Result Date: 12/31/2018 CLINICAL DATA:  Chest pain radiating into back today.  Vomiting. EXAM: CHEST - 2 VIEW COMPARISON:  None. FINDINGS: Heart size is normal. Lung volumes are low. Mild bibasilar atelectasis is present. No significant airspace consolidation is present. There is no edema or effusion. IMPRESSION: Low lung volumes and mild bibasilar atelectasis. No acute cardiopulmonary disease. Electronically Signed   By: San Morelle M.D.   On: 12/31/2018 16:22   Ct Chest W Contrast  Result Date: 01/01/2019 CLINICAL DATA:  Midsternal chest pain. Multiple large liver lesions on previous recent imaging. EXAM: CT CHEST WITH CONTRAST TECHNIQUE: Multidetector CT imaging of the chest was performed during intravenous contrast administration. CONTRAST:  23mL ISOVUE-300 IOPAMIDOL (ISOVUE-300) INJECTION 61% COMPARISON:  None. FINDINGS: Cardiovascular: Heart size upper normal. No pericardial effusion. No thoracic aortic aneurysm. Mediastinum/Nodes: Small mediastinal lymph nodes are evident without mediastinal lymphadenopathy. There is no hilar lymphadenopathy. The esophagus has normal imaging features. There is no axillary lymphadenopathy. Lungs/Pleura: The central tracheobronchial airways are patent. 5 mm right upper lobe ground-glass nodule identified on 48/5. 3 mm left upper lobe nodule (27/5) may be cavitary. Ill-defined 8 mm nodule identified central left upper lobe on 45/5. Dependent atelectasis noted in  the lower lobes bilaterally and associated airspace infection could not be excluded. There is no evidence for pleural effusion. Upper Abdomen: Large posterior right hepatic mass noted with other smaller hepatic lesions evident, better characterized on previous imaging. Musculoskeletal: No worrisome lytic or sclerotic osseous abnormality. IMPRESSION: 1. No evidence for primary malignancy in the thorax. 2. Scattered tiny bilateral pulmonary nodules are too small to characterize and nonspecific. Metastatic disease could certainly have this appearance and close attention on follow-up recommended. 3. Known hepatic lesions again noted. Electronically Signed   By: Misty Stanley M.D.   On: 01/01/2019 13:18   Mr Abdomen W Wo Contrast  Result Date: 01/01/2019 CLINICAL DATA:  Followup multiple liver lesions. EXAM: MRI ABDOMEN WITHOUT AND WITH CONTRAST TECHNIQUE: Multiplanar multisequence MR imaging of the abdomen was performed both before and after the administration of intravenous contrast. CONTRAST:  10 cc Gadavist COMPARISON:  Ultrasound abdomen, 12/31/2018 and CT abdomen 12/31/2018. FINDINGS: Lower chest: Bibasilar atelectasis is noted. No obvious pulmonary lesions. An 8 mm right lower lobe pulmonary nodule was noted on the CT scan. The distal esophagus is grossly normal. Hepatobiliary: As demonstrated on the ultrasound and CT scan there are innumerable large masses throughout both lobes of the liver. 8 cm segment 8 liver lesion appears to have a central scar. Similar appearance of the 9 cm segment 3 lesion which has central necrosis and hemorrhage. 9.5 cm segment 6 lesion demonstrates extensive necrosis with variable areas of hemorrhage. Gallstones are again noted in the gallbladder but no definite findings for acute cholecystitis. Pancreas:  No mass, inflammation or ductal dilatation. Spleen:  Normal size.  No focal lesions. Adrenals/Urinary Tract: The adrenal glands and kidneys are unremarkable. Simple left renal  cyst is noted. Stomach/Bowel: No evidence of a gastric mass. The small bowel and colon are grossly normal. No obvious colon cancer is demonstrated on this exam or the CT scan. Vascular/Lymphatic: The aorta and branch vessels are patent. The major venous structures are patent. No mesenteric mass or adenopathy. No retroperitoneal adenopathy. Other:  No ascites or abdominal wall hernia. Musculoskeletal: No significant bony findings. IMPRESSION: 1. Numerous large masses throughout both lobes of the liver, likely metastatic disease but possible multifocal primary hepatic tumors. No obvious primary neoplasm is identified. Specifically, I do not see any evidence of a gastric or pancreatic tumor or colon cancer. No obvious breast masses. 2. Recommend consideration for ultrasound-guided liver biopsy and chest CT to exclude a pulmonary neoplasm. Electronically Signed   By: Marijo Sanes M.D.   On: 01/01/2019 09:29   Ct Abdomen Pelvis W Contrast  Result Date: 12/31/2018 CLINICAL DATA:  53 y/o F; epigastric pain radiating to the back with nausea and vomiting for 3 days. EXAM: CT ABDOMEN AND PELVIS WITH CONTRAST TECHNIQUE: Multidetector CT imaging of the abdomen and pelvis was performed using the standard protocol following bolus administration of intravenous contrast. CONTRAST:  161mL ISOVUE-300 IOPAMIDOL (ISOVUE-300) INJECTION 61% COMPARISON:  None. FINDINGS: Lower chest: 8 mm right lower lobe pulmonary nodule (series 4, image 1). Hepatobiliary: Cholelithiasis. No gallbladder wall thickening or biliary ductal dilatation. Numerous liver masses occupying approximately 70% of liver parenchyma measuring up to 9.2 x 9.1 cm in the right lobe of liver (series 2, image 24). Several masses are peripheral and exophytic, however, no extrahepatic invasion is identified. Pancreas: Unremarkable. No pancreatic ductal dilatation or surrounding inflammatory changes. Spleen: Normal in size without focal abnormality. Adrenals/Urinary Tract:  Normal adrenal glands. 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 No additional focal kidney lesion, urinary stone disease, hydronephrosis. Normal bladder. Stomach/Bowel: Stomach is within normal limits. Appendix appears normal. No evidence of bowel wall thickening, distention, or inflammatory changes. Vascular/Lymphatic: No significant vascular findings are present. No enlarged abdominal or pelvic lymph nodes. Reproductive: Multiple uterine fibroids measuring up to 4.2 cm. No adnexal mass identified. Other: No abdominal wall hernia or abnormality. No abdominopelvic ascites. Musculoskeletal: No acute or significant osseous findings. IMPRESSION: 1. Numerous nonspecific liver masses measuring up to 9.2 cm. No extrahepatic invasion is identified. Some differential considerations include metastasis, primary hepatocellular or biliary malignancy, hepatic adenomatosis, and lymphoma. MRI of the abdomen with and without contrast may help to characterize and tissue sampling is recommended. 2. 8 mm right lower lobe pulmonary nodule. 3. Cholelithiasis.  No findings of acute cholecystitis. 4. Multiple uterine fibroids measuring up to 4.2 cm. 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 Electronically Signed   By: Kristine Garbe M.D.   On: 12/31/2018 21:42   US Abdomen Limited  Result Date: 12/31/2018 CLINICAL DATA:  Sudden onset epigastric pain with nausea and vomiting. EXAM: ULTRASOUND ABDOMEN LIMITED RIGHT UPPER QUADRANT COMPARISON:  None. FINDINGS: Gallbladder: Small gallstones and sludge are noted. No wall thickening, however there is a focal irregularity of the gallbladder wall adjacent to a large mass in the right hepatic lobe. No sonographic Murphy sign noted by sonographer. Common bile duct: Diameter: 1 mm, normal. Liver: There is a 6.2 x 4.7 x 4.0 cm heterogeneous mass with internal vascularity in the right hepatic lobe adjacent to the gallbladder. There is also a 5.5  x 2.1 x 2.9 cm heterogeneous  mass with internal vascularity in the left hepatic lobe. Heterogeneous parenchymal echogenicity. Portal vein is patent on color Doppler imaging with normal direction of blood flow towards the liver. IMPRESSION: 1. Large solid-appearing heterogeneous masses in the left and right hepatic lobes. The right hepatic lobe mass may be invading the gallbladder given adjacent gallbladder wall irregularity. Findings are concerning for malignancy. Recommend CT of the abdomen and pelvis with contrast for further evaluation. 2. Cholelithiasis and sludge without sonographic evidence of acute cholecystitis. Electronically Signed   By: Titus Dubin M.D.   On: 12/31/2018 20:04   Ir US Guide Bx Asp/drain  Result Date: 01/01/2019 INDICATION: No known primary, now with multiple liver lesions and masses worrisome for metastatic disease. Please from ultrasound-guided liver mass biopsy for tissue diagnostic purposes. EXAM: ULTRASOUND GUIDED LIVER LESION BIOPSY COMPARISON:  CT abdomen and pelvis - 12/31/2018; abdominal MRI - 01/01/2019 MEDICATIONS: None ANESTHESIA/SEDATION: Fentanyl 75 mcg IV; Versed 1.5 mg IV Total Moderate Sedation time:  13 Minutes. The patient's level of consciousness and vital signs were monitored continuously by radiology nursing throughout the procedure under my direct supervision. COMPLICATIONS: None immediate. PROCEDURE: Informed written consent was obtained from the patient after a discussion of the risks, benefits and alternatives to treatment. The patient understands and consents the procedure. A timeout was performed prior to the initiation of the procedure. Ultrasound scanning was performed of the right upper abdominal quadrant demonstrates multiple mixed echogenic liver lesions and masses compatible with findings seen on preceding abdominal CT and MRI. Dominant lesion within the posterior aspect the right lobe of the liver was targeted for biopsy given lesion location and  sonographic window. The procedure was planned. The right upper abdominal quadrant was prepped and draped in the usual sterile fashion. The overlying soft tissues were anesthetized with 1% lidocaine with epinephrine. A 17 gauge, 6.8 cm co-axial needle was advanced into a peripheral aspect of the lesion. This was followed by 5 core biopsies with an 18 gauge core device under direct ultrasound guidance. The coaxial needle tract was embolized with a small amount of Gel-Foam slurry and superficial hemostasis was obtained with manual compression. Post procedural scanning was negative for definitive area of hemorrhage or additional complication. A dressing was placed. The patient tolerated the procedure well without immediate post procedural complication. IMPRESSION: Technically successful ultrasound guided core needle biopsy of dominant mass within the posterior segment of the right lobe of the liver. Electronically Signed   By: Sandi Mariscal M.D.   On: 01/01/2019 17:34    Labs:  CBC: Recent Labs    12/31/18 1626 01/01/19 0528 01/02/19 0525  WBC 13.3* 13.4* 14.9*  HGB 11.1* 8.4* 6.9*  HCT 35.4* 27.7* 23.0*  PLT 419* 400 350    COAGS: Recent Labs    01/01/19 0528 01/02/19 1057  INR 1.20 1.21    BMP: Recent Labs    12/31/18 1626 01/01/19 0528 01/02/19 0525  NA 137 139 138  K 3.5 4.0 3.4*  CL 104 108 106  CO2 25 21* 23  GLUCOSE 111* 114* 124*  BUN 26* 26* 14  CALCIUM 9.2 8.4* 7.9*  CREATININE 0.63 0.76 0.61  GFRNONAA >60 >60 >60  GFRAA >60 >60 >60    LIVER FUNCTION TESTS: Recent Labs    12/31/18 2006 01/01/19 0528 01/02/19 0525  BILITOT 0.6 0.8 0.9  AST 95* 414* 662*  ALT 98* 393* 604*  ALKPHOS 265* 209* 233*  PROT 7.0 6.2* 5.8*  ALBUMIN 3.3* 2.9* 2.7*    Assessment  and Plan:  BP 133/70; still HR 117 Getting 2 units per MD abd findings - although tender-- not changed form yesterday Will continue to monitor Call IR if need--- Dr Kathlene Cote  [phone]    Electronically Signed: Lavonia Drafts, PA-C 01/02/2019, 12:34 PM   I spent a total of 15 Minutes at the the patient's bedside AND on the patient's hospital floor or unit, greater than 50% of which was counseling/coordinating care for hemorrhage post liver bx 01/01/19

## 2019-01-02 NOTE — Progress Notes (Addendum)
PROGRESS NOTE    Jodi Forbes  HFW:[id number] DOB: 1966-07-23 DOA: 12/31/2018 PCP: Patient, No Pcp Per    Brief Narrative:  53 year old with no significant past medical history who presented to the emergency department complaining of abdominal pain that started the night prior to admission she also report nausea vomiting restarted the day prior to admission. Evaluation in the ED showed creased transaminases alkaline phosphatase 265, leukocytosis at 13,.  Right upper quadrant ultrasound was concerning for large solid-appearing masses with concern for possible invasion into the gallbladder. CT abdomen show numerous nonspecific liver masses without extrahepatic invasion possible metastatic versus primary hepatocellular or biliary malignancy.   Assessment & Plan:   Principal Problem:   Liver masses Active Problems:   Leukocytosis   Thrombocytosis (HCC)  1-Liver masses; metastatic cancer unknown primary;  blood cultures no growth less than 24 hours.  Patient underwent liver biopsy 01-01-2019 Alpha-fetoprotein level and CEA level negative HIV negative. Hepatitis panel negative.  CT chest multiples small pulmonary nodule.  Liver biopsy pending.  Oncology consulted.   Leukocytosis thrombocytosis Could be reactive. blood cultures; no growth to date.  On IV Unasyn.   Anemia;acute blood loss anemia; intralesional hemorrhage and retroperitoneum.  Also Iron deficiency anemia.  Hb this am drop to 6.9. two units of PRBC ordered.  Transfer to step down  unit.  CT abdomen ordered; showed new intralesional hemorrhage and new small moderate retroperitoneum in the pelvis.  Cycle hb.  IR will see patient.  GI was consulted.  I informed, Dr Ninfa Linden CT abdomen results. He recommend Blood transfusion and monitor hb.    Fever;  Follow blood culture.  Might be reactive.  On IV antibiotics.  Follow urine culture.   Atelectasis;  Incentive spirometry.   Hypokalemia; replete  orally  Transaminases; from liver mass.   Estimated body mass index is 32.1 kg/m as calculated from the following:   Height as of this encounter: 5\' 4"  (1.626 m).   Weight as of this encounter: 84.8 kg.   DVT prophylaxis: SCDs Code Status: Full code Family Communication: Mother at bedside.  Disposition Plan: Remain in the hospital for IV antibiotics and further evaluation of multiple liver mass, pain management.  Consultants:   Surgery  I R   Procedures:  Liver biopsy Antimicrobials:   Unasyn  Subjective: Sleepy, wake up answer questions.  Still complaining of abdominal pain, back pain.   Objective: Vitals:   01/01/19 2123 01/01/19 2214 01/02/19 0539 01/02/19 0816  BP: 123/69 125/69 133/73 (!) 111/57  Pulse: (!) 122 (!) 116 (!) 122 (!) 114  Resp: (!) 27  (!) 24   Temp: 98 F (36.7 C) 97.9 F (36.6 C) (!) 100.4 F (38 C)   TempSrc: Axillary Axillary Oral   SpO2: (!) 89% 97% 90% 97%  Weight:      Height:        Intake/Output Summary (Last 24 hours) at 01/02/2019 0842 Last data filed at 01/02/2019 0277 Gross per 24 hour  Intake 2286.46 ml  Output 1575 ml  Net 711.46 ml   Filed Weights   12/31/18 1555  Weight: 84.8 kg    Examination:  General exam: NAD Respiratory system: Decrease breath sounds, CTA Cardiovascular system: S 1, S 2 RRR Gastrointestinal system: BS present, soft, mild tender.  Central nervous system: sleepy Extremities: symmetric power. . Skin: No rashes/   Data Reviewed: I have personally reviewed following labs and imaging studies  CBC: Recent Labs  Lab 12/31/18 1626 01/01/19 0528 01/02/19 4128  WBC 13.3* 13.4* 14.9*  NEUTROABS  --  11.3*  --   HGB 11.1* 8.4* 6.9*  HCT 35.4* 27.7* 23.0*  MCV 90.8 92.3 93.9  PLT 419* 400 485   Basic Metabolic Panel: Recent Labs  Lab 12/31/18 1626 01/01/19 0528 01/02/19 0525  NA 137 139 138  K 3.5 4.0 3.4*  CL 104 108 106  CO2 25 21* 23  GLUCOSE 111* 114* 124*  BUN 26* 26* 14   CREATININE 0.63 0.76 0.61  CALCIUM 9.2 8.4* 7.9*   GFR: Estimated Creatinine Clearance: 87.6 mL/min (by C-G formula based on SCr of 0.61 mg/dL). Liver Function Tests: Recent Labs  Lab 12/31/18 2006 01/01/19 0528 01/02/19 0525  AST 95* 414* 662*  ALT 98* 393* 604*  ALKPHOS 265* 209* 233*  BILITOT 0.6 0.8 0.9  PROT 7.0 6.2* 5.8*  ALBUMIN 3.3* 2.9* 2.7*   Recent Labs  Lab 12/31/18 1626  LIPASE 25   No results for input(s): AMMONIA in the last 168 hours. Coagulation Profile: Recent Labs  Lab 01/01/19 0528  INR 1.20   Cardiac Enzymes: Recent Labs  Lab 12/31/18 1626 12/31/18 2006  TROPONINI <0.03 <0.03   BNP (last 3 results) No results for input(s): PROBNP in the last 8760 hours. HbA1C: No results for input(s): HGBA1C in the last 72 hours. CBG: Recent Labs  Lab 01/01/19 0752 01/02/19 0731  GLUCAP 101* 112*   Lipid Profile: No results for input(s): CHOL, HDL, LDLCALC, TRIG, CHOLHDL, LDLDIRECT in the last 72 hours. Thyroid Function Tests: No results for input(s): TSH, T4TOTAL, FREET4, T3FREE, THYROIDAB in the last 72 hours. Anemia Panel: Recent Labs    01/02/19 0525  VITAMINB12 1,467*  FOLATE 12.2  FERRITIN 776*  TIBC 533*  IRON 19*  RETICCTPCT 2.1   Sepsis Labs: No results for input(s): PROCALCITON, LATICACIDVEN in the last 168 hours.  Recent Results (from the past 240 hour(s))  Culture, blood (routine x 2)     Status: None (Preliminary result)   Collection Time: 01/01/19 11:09 AM  Result Value Ref Range Status   Specimen Description BLOOD RIGHT HAND  Final   Special Requests   Final    BOTTLES DRAWN AEROBIC AND ANAEROBIC Blood Culture adequate volume Performed at [facility] [address] [place], [place] [address]    Culture NO GROWTH < 24 HOURS  Final   Report Status PENDING  Incomplete  Culture, blood (routine x 2)     Status: None (Preliminary result)   Collection Time: 01/01/19 11:10 AM  Result Value Ref Range  Status   Specimen Description BLOOD LEFT ARM  Final   Special Requests   Final    BOTTLES DRAWN AEROBIC AND ANAEROBIC Blood Culture adequate volume Performed at [facility] [address] [place], [place] [address]    Culture NO GROWTH < 24 HOURS  Final   Report Status PENDING  Incomplete         Radiology Studies: Dg Chest 2 View  Result Date: 12/31/2018 CLINICAL DATA:  Chest pain radiating into back today.  Vomiting. EXAM: CHEST - 2 VIEW COMPARISON:  None. FINDINGS: Heart size is normal. Lung volumes are low. Mild bibasilar atelectasis is present. No significant airspace consolidation is present. There is no edema or effusion. IMPRESSION: Low lung volumes and mild bibasilar atelectasis. No acute cardiopulmonary disease. Electronically Signed   By: San Morelle M.D.   On: 12/31/2018 16:22   Ct Chest W Contrast  Result Date: 01/01/2019 CLINICAL DATA:  Midsternal chest pain. Multiple large liver lesions on previous recent imaging. EXAM: CT CHEST WITH CONTRAST TECHNIQUE: Multidetector CT imaging of the chest was performed during intravenous contrast administration. CONTRAST:  69mL ISOVUE-300 IOPAMIDOL (ISOVUE-300) INJECTION 61% COMPARISON:  None. FINDINGS: Cardiovascular: Heart size upper normal. No pericardial effusion. No thoracic aortic aneurysm. Mediastinum/Nodes: Small mediastinal lymph nodes are evident without mediastinal lymphadenopathy. There is no hilar lymphadenopathy. The esophagus has normal imaging features. There is no axillary lymphadenopathy. Lungs/Pleura: The central tracheobronchial airways are patent. 5 mm right upper lobe ground-glass nodule identified on 48/5. 3 mm left upper lobe nodule (27/5) may be cavitary. Ill-defined 8 mm nodule identified central left upper lobe on 45/5. Dependent atelectasis noted in the lower lobes bilaterally and associated airspace infection could not be excluded. There is no evidence for pleural effusion. Upper Abdomen:  Large posterior right hepatic mass noted with other smaller hepatic lesions evident, better characterized on previous imaging. Musculoskeletal: No worrisome lytic or sclerotic osseous abnormality. IMPRESSION: 1. No evidence for primary malignancy in the thorax. 2. Scattered tiny bilateral pulmonary nodules are too small to characterize and nonspecific. Metastatic disease could certainly have this appearance and close attention on follow-up recommended. 3. Known hepatic lesions again noted. Electronically Signed   By: Misty Stanley M.D.   On: 01/01/2019 13:18   Mr Abdomen W Wo Contrast  Result Date: 01/01/2019 CLINICAL DATA:  Followup multiple liver lesions. EXAM: MRI ABDOMEN WITHOUT AND WITH CONTRAST TECHNIQUE: Multiplanar multisequence MR imaging of the abdomen was performed both before and after the administration of intravenous contrast. CONTRAST:  10 cc Gadavist COMPARISON:  Ultrasound abdomen, 12/31/2018 and CT abdomen 12/31/2018. FINDINGS: Lower chest: Bibasilar atelectasis is noted. No obvious pulmonary lesions. An 8 mm right lower lobe pulmonary nodule was noted on the CT scan. The distal esophagus is grossly normal. Hepatobiliary: As demonstrated on the ultrasound and CT scan there are innumerable large masses throughout both lobes of the liver. 8 cm segment 8 liver lesion appears to have a central scar. Similar appearance of the 9 cm segment 3 lesion which has central necrosis and hemorrhage. 9.5 cm segment 6 lesion demonstrates extensive necrosis with variable areas of hemorrhage. Gallstones are again noted in the gallbladder but no definite findings for acute cholecystitis. Pancreas:  No mass, inflammation or ductal dilatation. Spleen:  Normal size.  No focal lesions. Adrenals/Urinary Tract: The adrenal glands and kidneys are unremarkable. Simple left renal cyst is noted. Stomach/Bowel: No evidence of a gastric mass. The small bowel and colon are grossly normal. No obvious colon cancer is demonstrated  on this exam or the CT scan. Vascular/Lymphatic: The aorta and branch vessels are patent. The major venous structures are patent. No mesenteric mass or adenopathy. No retroperitoneal adenopathy. Other:  No ascites or abdominal wall hernia. Musculoskeletal: No significant bony findings. IMPRESSION: 1. Numerous large masses throughout both lobes of the liver, likely metastatic disease but possible multifocal primary hepatic tumors. No obvious primary neoplasm is identified. Specifically, I do not see any evidence of a gastric or pancreatic tumor or colon cancer. No obvious breast masses. 2. Recommend consideration for ultrasound-guided liver biopsy and chest CT to exclude a pulmonary neoplasm. Electronically Signed   By: Marijo Sanes M.D.   On: 01/01/2019 09:29   Ct Abdomen Pelvis W Contrast  Result Date: 12/31/2018 CLINICAL DATA:  53 y/o F; epigastric pain radiating to the back with nausea and vomiting for 3 days. EXAM: CT ABDOMEN AND PELVIS WITH CONTRAST TECHNIQUE: Multidetector CT imaging of the  abdomen and pelvis was performed using the standard protocol following bolus administration of intravenous contrast. CONTRAST:  119mL ISOVUE-300 IOPAMIDOL (ISOVUE-300) INJECTION 61% COMPARISON:  None. FINDINGS: Lower chest: 8 mm right lower lobe pulmonary nodule (series 4, image 1). Hepatobiliary: Cholelithiasis. No gallbladder wall thickening or biliary ductal dilatation. Numerous liver masses occupying approximately 70% of liver parenchyma measuring up to 9.2 x 9.1 cm in the right lobe of liver (series 2, image 24). Several masses are peripheral and exophytic, however, no extrahepatic invasion is identified. Pancreas: Unremarkable. No pancreatic ductal dilatation or surrounding inflammatory changes. Spleen: Normal in size without focal abnormality. Adrenals/Urinary Tract: Normal adrenal glands. 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 No additional focal kidney lesion, urinary  stone disease, hydronephrosis. Normal bladder. Stomach/Bowel: Stomach is within normal limits. Appendix appears normal. No evidence of bowel wall thickening, distention, or inflammatory changes. Vascular/Lymphatic: No significant vascular findings are present. No enlarged abdominal or pelvic lymph nodes. Reproductive: Multiple uterine fibroids measuring up to 4.2 cm. No adnexal mass identified. Other: No abdominal wall hernia or abnormality. No abdominopelvic ascites. Musculoskeletal: No acute or significant osseous findings. IMPRESSION: 1. Numerous nonspecific liver masses measuring up to 9.2 cm. No extrahepatic invasion is identified. Some differential considerations include metastasis, primary hepatocellular or biliary malignancy, hepatic adenomatosis, and lymphoma. MRI of the abdomen with and without contrast may help to characterize and tissue sampling is recommended. 2. 8 mm right lower lobe pulmonary nodule. 3. Cholelithiasis.  No findings of acute cholecystitis. 4. Multiple uterine fibroids measuring up to 4.2 cm. 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 Electronically Signed   By: Kristine Garbe M.D.   On: 12/31/2018 21:42   US Abdomen Limited  Result Date: 12/31/2018 CLINICAL DATA:  Sudden onset epigastric pain with nausea and vomiting. EXAM: ULTRASOUND ABDOMEN LIMITED RIGHT UPPER QUADRANT COMPARISON:  None. FINDINGS: Gallbladder: Small gallstones and sludge are noted. No wall thickening, however there is a focal irregularity of the gallbladder wall adjacent to a large mass in the right hepatic lobe. No sonographic Murphy sign noted by sonographer. Common bile duct: Diameter: 1 mm, normal. Liver: There is a 6.2 x 4.7 x 4.0 cm heterogeneous mass with internal vascularity in the right hepatic lobe adjacent to the gallbladder. There is also a 5.5 x 2.1 x 2.9 cm heterogeneous mass with internal vascularity in the left hepatic lobe. Heterogeneous parenchymal echogenicity. Portal vein is  patent on color Doppler imaging with normal direction of blood flow towards the liver. IMPRESSION: 1. Large solid-appearing heterogeneous masses in the left and right hepatic lobes. The right hepatic lobe mass may be invading the gallbladder given adjacent gallbladder wall irregularity. Findings are concerning for malignancy. Recommend CT of the abdomen and pelvis with contrast for further evaluation. 2. Cholelithiasis and sludge without sonographic evidence of acute cholecystitis. Electronically Signed   By: Titus Dubin M.D.   On: 12/31/2018 20:04   Ir US Guide Bx Asp/drain  Result Date: 01/01/2019 INDICATION: No known primary, now with multiple liver lesions and masses worrisome for metastatic disease. Please from ultrasound-guided liver mass biopsy for tissue diagnostic purposes. EXAM: ULTRASOUND GUIDED LIVER LESION BIOPSY COMPARISON:  CT abdomen and pelvis - 12/31/2018; abdominal MRI - 01/01/2019 MEDICATIONS: None ANESTHESIA/SEDATION: Fentanyl 75 mcg IV; Versed 1.5 mg IV Total Moderate Sedation time:  13 Minutes. The patient's level of consciousness and vital signs were monitored continuously by radiology nursing throughout the procedure under my direct supervision. COMPLICATIONS: None immediate. PROCEDURE: Informed written consent was obtained  from the patient after a discussion of the risks, benefits and alternatives to treatment. The patient understands and consents the procedure. A timeout was performed prior to the initiation of the procedure. Ultrasound scanning was performed of the right upper abdominal quadrant demonstrates multiple mixed echogenic liver lesions and masses compatible with findings seen on preceding abdominal CT and MRI. Dominant lesion within the posterior aspect the right lobe of the liver was targeted for biopsy given lesion location and sonographic window. The procedure was planned. The right upper abdominal quadrant was prepped and draped in the usual sterile fashion. The  overlying soft tissues were anesthetized with 1% lidocaine with epinephrine. A 17 gauge, 6.8 cm co-axial needle was advanced into a peripheral aspect of the lesion. This was followed by 5 core biopsies with an 18 gauge core device under direct ultrasound guidance. The coaxial needle tract was embolized with a small amount of Gel-Foam slurry and superficial hemostasis was obtained with manual compression. Post procedural scanning was negative for definitive area of hemorrhage or additional complication. A dressing was placed. The patient tolerated the procedure well without immediate post procedural complication. IMPRESSION: Technically successful ultrasound guided core needle biopsy of dominant mass within the posterior segment of the right lobe of the liver. Electronically Signed   By: Sandi Mariscal M.D.   On: 01/01/2019 17:34        Scheduled Meds: . sodium chloride   Intravenous Once  . polyethylene glycol  17 g Oral Daily  . potassium chloride  40 mEq Oral Once   Continuous Infusions: . sodium chloride 100 mL/hr at 01/02/19 0533  . ampicillin-sulbactam (UNASYN) IV 3 g (01/02/19 0535)     LOS: 0 days    Time spent: 35 minutes    Elmarie Shiley, MD [facility] Pager [phone]  If 7PM-7AM, please contact night-coverage [email or web address] Password [email or web address] 01/02/2019, 8:42 AM

## 2019-01-02 NOTE — Progress Notes (Signed)
TC received from lab with critical Hgb of 6.9. Results sent to Dr. Tyrell Antonio. New order for type and cross and transfuse 2 units RBC.

## 2019-01-02 NOTE — Progress Notes (Signed)
Pt continues to have slightly elevated HR, temp elevated at 101.6.  Pt sitting up in chair earlier during assessment. Pt now lethargic and in bed. Pt will arouse to verbal stimuli but unable to stay awake. Dr. Tyrell Antonio on the floor and request that rapid response be called and pt transferred to step down unit.

## 2019-01-02 NOTE — Consult Note (Signed)
Consultation  Referring Provider:   Dr. Tyrell Antonio Primary Care Physician:  Patient, No Pcp Per Primary Gastroenterologist: Althia Forts        Reason for Consultation: Liver mass, abdominal pain, vomiting, acute blood loss anemia            HPI:   Jodi Forbes is a 53 y.o. female with a past medical history as listed below, who presented to the ER on 12/31/2018 with a complaint of abdominal pain, nausea and vomiting.    Today, patient explains that she is usually in good health, but had an episode of upper abdominal pain with nausea over the summer that resolved with Aleve and did not recur until 12/30/2018.  Describes pain in her epigastrium and right upper quadrant with nausea and several episodes of nonbloody vomiting.      This epigastric pain continues, rated as a 6-7/10, tender to the touch, non-radiating.  Patient tells me she has not had a bowel movement for about a week but has not been able to eat much until yesterday.  Did feel as though she needed to have a bowel this morning but cannot produce one.  Does describe taking a laxative in the hospital.  Denies seeing any bright red blood or melena.    Denies fever or chills.  **Patient moved down to ICU slightly before I saw her due to increased heart rate, rapid response team was called**  [facility] ER course: EKG was sinus rhythm nonspecific ST-T abnormalities in the lateral leads, chest x-ray is notable for low volumes with mild bibasilar atelectasis, can panel with transaminases in the 100 range and alk phos of 265 with normal bili, CBC with leukocytosis to 13,300, mild normocytic anemia with mild thrombocytosis, right upper quadrant ultrasound concerning for large solid-appearing heterogenous masses with concern for possible invasion of the gallbladder, followed by CT of the abdomen pelvis which featured numerous nonspecific liver masses without extrahepatic invasion, possibly metastatic, primary hepatocellular biliary  malignancy, hepatic adenomatosis or lymphoma  Past Medical History:  Diagnosis Date  . Medical history non-contributory     Past Surgical History:  Procedure Laterality Date  . IR US GUIDE BX ASP/DRAIN  01/01/2019    Family History  Problem Relation Age of Onset  . Diabetes Mother   . CAD Father     Social History   Tobacco Use  . Smoking status: Never Smoker  . Smokeless tobacco: Never Used  Substance Use Topics  . Alcohol use: Never    Frequency: Never  . Drug use: Never    Prior to Admission medications   Not on File    Current Facility-Administered Medications  Medication Dose Route Frequency Provider Last Rate Last Dose  . 0.9 %  sodium chloride infusion (Manually program via Guardrails IV Fluids)   Intravenous Once Regalado, Belkys A, MD      . 0.9 %  sodium chloride infusion   Intravenous Continuous Regalado, Belkys A, MD 125 mL/hr at 01/02/19 1134    . acetaminophen (TYLENOL) tablet 650 mg  650 mg Oral Q6H PRN Regalado, Belkys A, MD   650 mg at 01/02/19 0541   Or  . acetaminophen (TYLENOL) suppository 650 mg  650 mg Rectal Q6H PRN Regalado, Belkys A, MD      . Ampicillin-Sulbactam (UNASYN) 3 g in sodium chloride 0.9 % 100 mL IVPB  3 g Intravenous Q6H Regalado, Belkys A, MD   Stopped at 01/02/19 4656  . fentaNYL (SUBLIMAZE) injection 25-50  mcg  25-50 mcg Intravenous Q2H PRN Regalado, Belkys A, MD   50 mcg at 01/02/19 1140  . ondansetron (ZOFRAN) tablet 4 mg  4 mg Oral Q6H PRN Regalado, Belkys A, MD       Or  . ondansetron (ZOFRAN) injection 4 mg  4 mg Intravenous Q6H PRN Regalado, Belkys A, MD      . pantoprazole (PROTONIX) injection 40 mg  40 mg Intravenous Q12H Regalado, Belkys A, MD   40 mg at 01/02/19 1139  . polyethylene glycol (MIRALAX / GLYCOLAX) packet 17 g  17 g Oral Daily Regalado, Belkys A, MD   17 g at 01/01/19 2038  . senna-docusate (Senokot-S) tablet 1 tablet  1 tablet Oral QHS PRN Regalado, Belkys A, MD      . sodium chloride 0.9 % bolus 500 mL  500  mL Intravenous Once Regalado, Belkys A, MD 491.8 mL/hr at 01/02/19 1054 500 mL at 01/02/19 1054    Allergies as of 12/31/2018  . (No Known Allergies)     Review of Systems:    Constitutional: No weight loss, fever or chills Skin: No rash  Cardiovascular: No chest pain Respiratory: No SOB  Gastrointestinal: See HPI and otherwise negative Genitourinary: No dysuria  Neurological: No headache, dizziness or syncope Musculoskeletal: No new muscle or joint pain Hematologic: No bleeding Psychiatric: No history of depression or anxiety    Physical Exam:  Vital signs in last 24 hours: Temp:  [97.9 F (36.6 C)-101.6 F (38.7 C)] 99.8 F (37.7 C) (01/04 1130) Pulse Rate:  [106-124] 124 (01/04 1130) Resp:  [18-33] 33 (01/04 1130) BP: (88-141)/(57-103) 141/100 (01/04 1130) SpO2:  [89 %-100 %] 96 % (01/04 1130) Weight:  [87.9 kg] 87.9 kg (01/04 1030) Last BM Date: 01/01/19 General:   Pleasant AA female appears to be in NAD, Well developed, Well nourished, alert and cooperative Head:  Normocephalic and atraumatic. Eyes:   PEERL, EOMI. No icterus. Conjunctiva pink. Ears:  Normal auditory acuity. Neck:  Supple Throat: Oral cavity and pharynx without inflammation, swelling or lesion.  Lungs: Respirations even and unlabored. Lungs clear to auscultation bilaterally.   No wheezes, crackles, or rhonchi.  Heart: Normal S1, S2. No MRG. Regular rate and rhythm. No peripheral edema, cyanosis or pallor.  Abdomen:  Soft, nondistended, Moderate Epigastric/RUQ ttp. No rebound or guarding. Normal bowel sounds. No appreciable masses or hepatomegaly. Rectal:  Not performed.  Msk:  Symmetrical without gross deformities. Peripheral pulses intact.  Extremities:  Without edema, no deformity or joint abnormality. Normal ROM, normal sensation. Neurologic:  Alert and  oriented x4;  grossly normal neurologically.  Skin:   Dry and intact without significant lesions or rashes. Psychiatric: Demonstrates good  judgement and reason without abnormal affect or behaviors.   LAB RESULTS: Recent Labs    12/31/18 1626 01/01/19 0528 01/02/19 0525  WBC 13.3* 13.4* 14.9*  HGB 11.1* 8.4* 6.9*  HCT 35.4* 27.7* 23.0*  PLT 419* 400 350   BMET Recent Labs    12/31/18 1626 01/01/19 0528 01/02/19 0525  NA 137 139 138  K 3.5 4.0 3.4*  CL 104 108 106  CO2 25 21* 23  GLUCOSE 111* 114* 124*  BUN 26* 26* 14  CREATININE 0.63 0.76 0.61  CALCIUM 9.2 8.4* 7.9*   LFT Recent Labs    12/31/18 2006  01/02/19 0525  PROT 7.0   < > 5.8*  ALBUMIN 3.3*   < > 2.7*  AST 95*   < > 662*  ALT 98*   < >  604*  ALKPHOS 265*   < > 233*  BILITOT 0.6   < > 0.9  BILIDIR 0.2  --   --   IBILI 0.4  --   --    < > = values in this interval not displayed.   PT/INR Recent Labs    01/01/19 0528 01/02/19 1057  LABPROT 15.1 15.2  INR 1.20 1.21    STUDIES: Ct Abdomen Pelvis Wo Contrast  Addendum Date: 01/02/2019   ADDENDUM REPORT: 01/02/2019 10:53 ADDENDUM: These results were called by telephone at the time of interpretation on 01/02/2019 at 10:43 am to Dr. Niel Hummer , who verbally acknowledged these results. Electronically Signed   By: Jacqulynn Cadet M.D.   On: 01/02/2019 10:53   Result Date: 01/02/2019 CLINICAL DATA:  53 year old female with lethargy, decreased arousal and new onset anemia. Patient underwent liver biopsy yesterday. Evaluate for hemorrhage. EXAM: CT ABDOMEN AND PELVIS WITHOUT CONTRAST TECHNIQUE: Multidetector CT imaging of the abdomen and pelvis was performed following the standard protocol without IV contrast. COMPARISON:  Recent prior CT scan of the abdomen and pelvis 12/31/2018; MRI of the abdomen 01/01/2019 and CT scan of the chest also 01/01/2019 FINDINGS: Lower chest: Increased round atelectasis in the posterior aspect of the right lower lobe persistent left lower lobe atelectasis. Trace right pleural effusion. The intracardiac blood pool is hypodense relative to the adjacent myocardium  consistent with anemia. Hepatobiliary: Hepatomegaly with multiple hepatic masses as previously seen. There is new high attenuation within the mass in the posterior aspect of the right hemi liver as well as several small foci of gas likely representing the site of prior biopsy complicated by some intralesional hemorrhage. Gallstones again noted within the gallbladder. Pancreas: Unremarkable. No pancreatic ductal dilatation or surrounding inflammatory changes. Spleen: Normal in size without focal abnormality. Adrenals/Urinary Tract: Normal adrenal glands. No evidence of hydronephrosis or nephrolithiasis. 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 Stomach/Bowel: No focal bowel wall thickening or evidence of obstruction. Normal appendix in the right lower quadrant. Vascular/Lymphatic: Limited evaluation in the absence of intravenous contrast. No evidence of aneurysm. Reproductive: Dystrophic calcified uterine fibroids. No adnexal masses. Other: Interval development of moderate hemoperitoneum predominantly layering in the anatomic pelvis with a trace amount in the pericolic gutters. Musculoskeletal: No acute fracture or aggressive appearing lytic or blastic osseous lesion. IMPRESSION: 1. Positive for new intralesional hemorrhage and new small-moderate hemoperitoneum layering in the anatomic pelvis. Findings likely represent bleeding from the recent biopsy site. 2. Slightly increased bilateral lower lobe atelectasis. 3. Otherwise, no significant interval change compared to recent prior imaging. Electronically Signed: By: Jacqulynn Cadet M.D. On: 01/02/2019 10:32   Dg Chest 2 View  Result Date: 12/31/2018 CLINICAL DATA:  Chest pain radiating into back today.  Vomiting. EXAM: CHEST - 2 VIEW COMPARISON:  None. FINDINGS: Heart size is normal. Lung volumes are low. Mild bibasilar atelectasis is present. No significant airspace consolidation is present. There  is no edema or effusion. IMPRESSION: Low lung volumes and mild bibasilar atelectasis. No acute cardiopulmonary disease. Electronically Signed   By: San Morelle M.D.   On: 12/31/2018 16:22   Ct Chest W Contrast  Result Date: 01/01/2019 CLINICAL DATA:  Midsternal chest pain. Multiple large liver lesions on previous recent imaging. EXAM: CT CHEST WITH CONTRAST TECHNIQUE: Multidetector CT imaging of the chest was performed during intravenous contrast administration. CONTRAST:  46m ISOVUE-300 IOPAMIDOL (ISOVUE-300) INJECTION 61% COMPARISON:  None. FINDINGS: Cardiovascular: Heart size upper  normal. No pericardial effusion. No thoracic aortic aneurysm. Mediastinum/Nodes: Small mediastinal lymph nodes are evident without mediastinal lymphadenopathy. There is no hilar lymphadenopathy. The esophagus has normal imaging features. There is no axillary lymphadenopathy. Lungs/Pleura: The central tracheobronchial airways are patent. 5 mm right upper lobe ground-glass nodule identified on 48/5. 3 mm left upper lobe nodule (27/5) may be cavitary. Ill-defined 8 mm nodule identified central left upper lobe on 45/5. Dependent atelectasis noted in the lower lobes bilaterally and associated airspace infection could not be excluded. There is no evidence for pleural effusion. Upper Abdomen: Large posterior right hepatic mass noted with other smaller hepatic lesions evident, better characterized on previous imaging. Musculoskeletal: No worrisome lytic or sclerotic osseous abnormality. IMPRESSION: 1. No evidence for primary malignancy in the thorax. 2. Scattered tiny bilateral pulmonary nodules are too small to characterize and nonspecific. Metastatic disease could certainly have this appearance and close attention on follow-up recommended. 3. Known hepatic lesions again noted. Electronically Signed   By: Misty Stanley M.D.   On: 01/01/2019 13:18   Mr Abdomen W Wo Contrast  Result Date: 01/01/2019 CLINICAL DATA:  Followup  multiple liver lesions. EXAM: MRI ABDOMEN WITHOUT AND WITH CONTRAST TECHNIQUE: Multiplanar multisequence MR imaging of the abdomen was performed both before and after the administration of intravenous contrast. CONTRAST:  10 cc Gadavist COMPARISON:  Ultrasound abdomen, 12/31/2018 and CT abdomen 12/31/2018. FINDINGS: Lower chest: Bibasilar atelectasis is noted. No obvious pulmonary lesions. An 8 mm right lower lobe pulmonary nodule was noted on the CT scan. The distal esophagus is grossly normal. Hepatobiliary: As demonstrated on the ultrasound and CT scan there are innumerable large masses throughout both lobes of the liver. 8 cm segment 8 liver lesion appears to have a central scar. Similar appearance of the 9 cm segment 3 lesion which has central necrosis and hemorrhage. 9.5 cm segment 6 lesion demonstrates extensive necrosis with variable areas of hemorrhage. Gallstones are again noted in the gallbladder but no definite findings for acute cholecystitis. Pancreas:  No mass, inflammation or ductal dilatation. Spleen:  Normal size.  No focal lesions. Adrenals/Urinary Tract: The adrenal glands and kidneys are unremarkable. Simple left renal cyst is noted. Stomach/Bowel: No evidence of a gastric mass. The small bowel and colon are grossly normal. No obvious colon cancer is demonstrated on this exam or the CT scan. Vascular/Lymphatic: The aorta and branch vessels are patent. The major venous structures are patent. No mesenteric mass or adenopathy. No retroperitoneal adenopathy. Other:  No ascites or abdominal wall hernia. Musculoskeletal: No significant bony findings. IMPRESSION: 1. Numerous large masses throughout both lobes of the liver, likely metastatic disease but possible multifocal primary hepatic tumors. No obvious primary neoplasm is identified. Specifically, I do not see any evidence of a gastric or pancreatic tumor or colon cancer. No obvious breast masses. 2. Recommend consideration for ultrasound-guided  liver biopsy and chest CT to exclude a pulmonary neoplasm. Electronically Signed   By: Marijo Sanes M.D.   On: 01/01/2019 09:29   Ct Abdomen Pelvis W Contrast  Result Date: 12/31/2018 CLINICAL DATA:  53 y/o F; epigastric pain radiating to the back with nausea and vomiting for 3 days. EXAM: CT ABDOMEN AND PELVIS WITH CONTRAST TECHNIQUE: Multidetector CT imaging of the abdomen and pelvis was performed using the standard protocol following bolus administration of intravenous contrast. CONTRAST:  152m ISOVUE-300 IOPAMIDOL (ISOVUE-300) INJECTION 61% COMPARISON:  None. FINDINGS: Lower chest: 8 mm right lower lobe pulmonary nodule (series 4, image 1). Hepatobiliary: Cholelithiasis. No gallbladder wall  thickening or biliary ductal dilatation. Numerous liver masses occupying approximately 70% of liver parenchyma measuring up to 9.2 x 9.1 cm in the right lobe of liver (series 2, image 24). Several masses are peripheral and exophytic, however, no extrahepatic invasion is identified. Pancreas: Unremarkable. No pancreatic ductal dilatation or surrounding inflammatory changes. Spleen: Normal in size without focal abnormality. Adrenals/Urinary Tract: Normal adrenal glands. 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 No additional focal kidney lesion, urinary stone disease, hydronephrosis. Normal bladder. Stomach/Bowel: Stomach is within normal limits. Appendix appears normal. No evidence of bowel wall thickening, distention, or inflammatory changes. Vascular/Lymphatic: No significant vascular findings are present. No enlarged abdominal or pelvic lymph nodes. Reproductive: Multiple uterine fibroids measuring up to 4.2 cm. No adnexal mass identified. Other: No abdominal wall hernia or abnormality. No abdominopelvic ascites. Musculoskeletal: No acute or significant osseous findings. IMPRESSION: 1. Numerous nonspecific liver masses measuring up to 9.2 cm. No extrahepatic invasion is identified.  Some differential considerations include metastasis, primary hepatocellular or biliary malignancy, hepatic adenomatosis, and lymphoma. MRI of the abdomen with and without contrast may help to characterize and tissue sampling is recommended. 2. 8 mm right lower lobe pulmonary nodule. 3. Cholelithiasis.  No findings of acute cholecystitis. 4. Multiple uterine fibroids measuring up to 4.2 cm. 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 Electronically Signed   By: Kristine Garbe M.D.   On: 12/31/2018 21:42   US Abdomen Limited  Result Date: 12/31/2018 CLINICAL DATA:  Sudden onset epigastric pain with nausea and vomiting. EXAM: ULTRASOUND ABDOMEN LIMITED RIGHT UPPER QUADRANT COMPARISON:  None. FINDINGS: Gallbladder: Small gallstones and sludge are noted. No wall thickening, however there is a focal irregularity of the gallbladder wall adjacent to a large mass in the right hepatic lobe. No sonographic Murphy sign noted by sonographer. Common bile duct: Diameter: 1 mm, normal. Liver: There is a 6.2 x 4.7 x 4.0 cm heterogeneous mass with internal vascularity in the right hepatic lobe adjacent to the gallbladder. There is also a 5.5 x 2.1 x 2.9 cm heterogeneous mass with internal vascularity in the left hepatic lobe. Heterogeneous parenchymal echogenicity. Portal vein is patent on color Doppler imaging with normal direction of blood flow towards the liver. IMPRESSION: 1. Large solid-appearing heterogeneous masses in the left and right hepatic lobes. The right hepatic lobe mass may be invading the gallbladder given adjacent gallbladder wall irregularity. Findings are concerning for malignancy. Recommend CT of the abdomen and pelvis with contrast for further evaluation. 2. Cholelithiasis and sludge without sonographic evidence of acute cholecystitis. Electronically Signed   By: Titus Dubin M.D.   On: 12/31/2018 20:04   Ir US Guide Bx Asp/drain  Result Date: 01/01/2019 INDICATION: No known primary, now  with multiple liver lesions and masses worrisome for metastatic disease. Please from ultrasound-guided liver mass biopsy for tissue diagnostic purposes. EXAM: ULTRASOUND GUIDED LIVER LESION BIOPSY COMPARISON:  CT abdomen and pelvis - 12/31/2018; abdominal MRI - 01/01/2019 MEDICATIONS: None ANESTHESIA/SEDATION: Fentanyl 75 mcg IV; Versed 1.5 mg IV Total Moderate Sedation time:  13 Minutes. The patient's level of consciousness and vital signs were monitored continuously by radiology nursing throughout the procedure under my direct supervision. COMPLICATIONS: None immediate. PROCEDURE: Informed written consent was obtained from the patient after a discussion of the risks, benefits and alternatives to treatment. The patient understands and consents the procedure. A timeout was performed prior to the initiation of the procedure. Ultrasound scanning was performed of the right upper abdominal quadrant demonstrates multiple mixed  echogenic liver lesions and masses compatible with findings seen on preceding abdominal CT and MRI. Dominant lesion within the posterior aspect the right lobe of the liver was targeted for biopsy given lesion location and sonographic window. The procedure was planned. The right upper abdominal quadrant was prepped and draped in the usual sterile fashion. The overlying soft tissues were anesthetized with 1% lidocaine with epinephrine. A 17 gauge, 6.8 cm co-axial needle was advanced into a peripheral aspect of the lesion. This was followed by 5 core biopsies with an 18 gauge core device under direct ultrasound guidance. The coaxial needle tract was embolized with a small amount of Gel-Foam slurry and superficial hemostasis was obtained with manual compression. Post procedural scanning was negative for definitive area of hemorrhage or additional complication. A dressing was placed. The patient tolerated the procedure well without immediate post procedural complication. IMPRESSION: Technically successful  ultrasound guided core needle biopsy of dominant mass within the posterior segment of the right lobe of the liver. Electronically Signed   By: Sandi Mariscal M.D.   On: 01/01/2019 17:34    Impression / Plan:   Impression: 1.  Liver masses: See imaging above, liver biopsy 01/01/2019 pending 2.  Leukocytosis/thrombocytosis: 3.  IDA: Hemoglobin 11.1--> 6.9 since admission, no signs of acute GI blood loss, did undergo a liver biopsy 01/01/2019, repeat CT abdomen pelvis was pending at time my interview with the patient, but has returned since positive for new intralesional hemorrhage and new small moderate hemoperitoneum layering in the anatomic pelvis, which represents bleeding from the recent biopsy site; likely this is the cause for large decrease in hemoglobin  Plan: 1.  Return of CT from this morning shows new hemoperitoneum, likely from recent bleeding after biopsy, likely this is the cause of such a dramatic drop in hemoglobin with no signs of acute GI bleeding outwardly. 2.  Continue to monitor hemoglobin and transfusion as needed less than 6 3.  Likely there is no need for immediate GI intervention with EGD or colonoscopy, if patient remains anemic or her hemoglobin remains unstable, please let us know and we will be happy to see the patient again 4.  Please await any final recommendations from Dr. Bryan Lemma later today  Thank you for your kind consultation.  Lavone Nian Evoleht Hovatter  01/02/2019, 11:44 AM

## 2019-01-02 NOTE — Progress Notes (Signed)
Called to see pt.Marland KitchenMarland KitchenLethargic, blood count decreased  HBG 6.9, recent liver bx (yesterday). [place] to name, oriented, B/P 125/70, HR 119 ST, Resp. Rate 27, Sats 97 % on 2 liter [place].   Will take to CT for scan of ABD then tx to SDU.

## 2019-01-03 DIAGNOSIS — R7401 Elevation of levels of liver transaminase levels: Secondary | ICD-10-CM

## 2019-01-03 DIAGNOSIS — R740 Nonspecific elevation of levels of transaminase and lactic acid dehydrogenase [LDH]: Secondary | ICD-10-CM

## 2019-01-03 DIAGNOSIS — D62 Acute posthemorrhagic anemia: Secondary | ICD-10-CM

## 2019-01-03 LAB — HEMOGLOBIN AND HEMATOCRIT, BLOOD
HCT: 28.2 % — ABNORMAL LOW (ref 36.0–46.0)
HCT: 28.1 % — ABNORMAL LOW (ref 36.0–46.0)
HCT: 28.1 % — ABNORMAL LOW (ref 36.0–46.0)
HEMATOCRIT: 27.8 % — AB (ref 36.0–46.0)
Hemoglobin: 8.6 g/dL — ABNORMAL LOW (ref 12.0–15.0)
Hemoglobin: 8.7 g/dL — ABNORMAL LOW (ref 12.0–15.0)
Hemoglobin: 8.8 g/dL — ABNORMAL LOW (ref 12.0–15.0)
Hemoglobin: 8.9 g/dL — ABNORMAL LOW (ref 12.0–15.0)

## 2019-01-03 LAB — COMPREHENSIVE METABOLIC PANEL
ALT: 383 U/L — ABNORMAL HIGH (ref 0–44)
AST: 198 U/L — AB (ref 15–41)
Albumin: 2.6 g/dL — ABNORMAL LOW (ref 3.5–5.0)
Alkaline Phosphatase: 223 U/L — ABNORMAL HIGH (ref 38–126)
Anion gap: 10 (ref 5–15)
BUN: 6 mg/dL (ref 6–20)
CALCIUM: 8 mg/dL — AB (ref 8.9–10.3)
CO2: 22 mmol/L (ref 22–32)
Chloride: 106 mmol/L (ref 98–111)
Creatinine, Ser: 0.48 mg/dL (ref 0.44–1.00)
GFR calc Af Amer: >60 mL/min (ref >60)
GFR calc non Af Amer: >60 mL/min (ref >60)
Glucose, Bld: 101 mg/dL — ABNORMAL HIGH (ref 70–99)
Potassium: 3.5 mmol/L (ref 3.5–5.1)
Sodium: 138 mmol/L (ref 135–145)
Total Bilirubin: 1.5 mg/dL — ABNORMAL HIGH (ref 0.3–1.2)
Total Protein: 5.7 g/dL — ABNORMAL LOW (ref 6.5–8.1)

## 2019-01-03 LAB — GLUCOSE, CAPILLARY: Glucose-Capillary: 107 mg/dL — ABNORMAL HIGH (ref 70–99)

## 2019-01-03 MED ORDER — SENNOSIDES-DOCUSATE SODIUM 8.6-50 MG PO TABS
1.0000 | ORAL_TABLET | Freq: Two times a day (BID) | ORAL | Status: DC
  Administered 2019-01-03 – 2019-01-06 (×7): 1 via ORAL
  Filled 2019-01-03 (×8): qty 1

## 2019-01-03 MED ORDER — HYDRALAZINE HCL 20 MG/ML IJ SOLN
10.0000 mg | Freq: Four times a day (QID) | INTRAMUSCULAR | Status: DC | PRN
  Administered 2019-01-03 – 2019-01-05 (×4): 10 mg via INTRAVENOUS
  Filled 2019-01-03 (×4): qty 1

## 2019-01-03 MED ORDER — POLYETHYLENE GLYCOL 3350 17 G PO PACK
17.0000 g | PACK | Freq: Two times a day (BID) | ORAL | Status: DC
  Administered 2019-01-03 – 2019-01-06 (×7): 17 g via ORAL
  Filled 2019-01-03 (×8): qty 1

## 2019-01-03 MED ORDER — POTASSIUM CHLORIDE CRYS ER 20 MEQ PO TBCR
40.0000 meq | EXTENDED_RELEASE_TABLET | Freq: Once | ORAL | Status: AC
  Administered 2019-01-03: 40 meq via ORAL
  Filled 2019-01-03: qty 2

## 2019-01-03 MED ORDER — OXYCODONE HCL 5 MG PO TABS
5.0000 mg | ORAL_TABLET | Freq: Four times a day (QID) | ORAL | Status: DC | PRN
  Administered 2019-01-03 – 2019-01-08 (×8): 10 mg via ORAL
  Filled 2019-01-03 (×8): qty 2

## 2019-01-03 NOTE — Progress Notes (Signed)
Jodi Forbes   DOB:[birth year]   EN#:[id number]    HEME/ONC OVERVIEW: 1. 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 and possible lung mets -The liver biopsy result is still pending -I will reach out to radiation oncology regarding possible palliative RT to the large liver metastases -Pending the biopsy results, we will be able to discuss treatment options and prognosis  Intralesional hepatic hemorrhage and new hemiperitoneum -Hemoglobin stable over the past 24 hours -No indication for surgery per GI surgery -IR following, no intervention planned at this time -I will defer the management to the primary team and IR  Acute on chronic normocytic anemia -Secondary to intralesional bleeding within the hepatic metastases and hemoperitoneum -See the management of bleeding above -I recommend supportive transfusion to keep Hgb > 7  Hyperbilirubinemia -T bili 1.5 today, up from 0.9 the day prior -Possibly due to recent liver biopsy versus biliary obstruction due to liver metastases -Palliative RT to be considered as above -I recommend monitoring daily LFTs; if hyperbilirubinemia worsens, it would preclude a significant number of chemotherapy options, as most chemotherapies are metabolized via the liver  Abdominal pain -Patient is currently on several opioid medications -I recommend consulting palliative care for pain control -I will  discuss the case with radiation oncology regarding the role for palliative RT to the large liver mets, which may also provide some pain relief  Thank you for the opportunity to participate in [name]'s care.  Please not hesitate to contact me if there are any questions.  Tish Men, MD 01/03/2019  2:44 PM   Subjective:  [name] reports that she still has persistent right upper quadrant and right flank pain near where she had liver biopsy.  She is receiving PRN oxycodone and IV fentanyl with relatively adequate pain control.  She denies any other complaint this morning.  ROS: Constitutional: ( - ) fevers, ( - )  chills , ( - ) night sweats Ears, nose, mouth, throat, and face: ( - ) mucositis, ( - ) sore throat Respiratory: ( - ) cough, ( - ) dyspnea, ( - ) wheezes Cardiovascular: ( - ) palpitation, ( - ) chest discomfort, ( - ) lower extremity swelling Gastrointestinal:  ( - ) nausea, ( - ) heartburn, ( - ) change in bowel habits Skin: ( - ) abnormal skin rashes Behavioral/Psych: ( - ) mood change, ( - ) new changes  All other systems were reviewed with the patient and are negative.  Objective:  Vitals:   01/03/19 1200 01/03/19 1300  BP: (!) 142/82 (!) 153/94  Pulse: (!) 110 (!) 111  Resp: (!) 22 (!) 27  Temp: 98.6 F (37 C)   SpO2: 97% 98%     Intake/Output Summary (Last 24 hours) at 01/03/2019 1444 Last data filed at 01/03/2019 1000 Gross per 24 hour  Intake 2243.65 ml  Output 900 ml  Net 1343.65 ml    GENERAL: Mildly uncomfortable, lying flat in bed, alert SKIN: skin color, texture, turgor are normal, no rashes or significant  lesions OROPHARYNX: Slightly dry oral mucosa NECK: supple, non-tender LUNGS: clear to auscultation and percussion with normal breathing effort HEART: regular rate & rhythm and no murmurs and no lower extremity edema ABDOMEN: soft, mildly distended, mild to moderate tenderness to palpation in the right upper quadrant/right flank, slightly  decreased bowel sounds PSYCH: alert & oriented x 3, fluent speech   Labs:  Lab Results  Component Value Date   WBC 14.9 (H) 01/02/2019   HGB 8.8 (L) 01/03/2019   HCT 28.1 (L) 01/03/2019   MCV 93.9 01/02/2019   PLT 350 01/02/2019   NEUTROABS 11.3 (H) 01/01/2019    Lab Results  Component Value Date   NA 138 01/03/2019   K 3.5 01/03/2019   CL 106 01/03/2019   CO2 22 01/03/2019    Studies:  Ct Abdomen Pelvis Wo Contrast  Addendum Date: 01/02/2019   ADDENDUM REPORT: 01/02/2019 10:53 ADDENDUM: These results were called by telephone at the time of interpretation on 01/02/2019 at 10:43 am to Dr. Niel Hummer , who verbally acknowledged these results. Electronically Signed   By: Jacqulynn Cadet M.D.   On: 01/02/2019 10:53   Result Date: 01/02/2019 CLINICAL DATA:  53 year old female with lethargy, decreased arousal and new onset anemia. Patient underwent liver biopsy yesterday. Evaluate for hemorrhage. EXAM: CT ABDOMEN AND PELVIS WITHOUT CONTRAST TECHNIQUE: Multidetector CT imaging of the abdomen and pelvis was performed following the standard protocol without IV contrast. COMPARISON:  Recent prior CT scan of the abdomen and pelvis 12/31/2018; MRI of the abdomen 01/01/2019 and CT scan of the chest also 01/01/2019 FINDINGS: Lower chest: Increased round atelectasis in the posterior aspect of the right lower lobe persistent left lower lobe atelectasis. Trace right pleural effusion. The intracardiac blood pool is hypodense relative to the adjacent myocardium consistent with anemia. Hepatobiliary: Hepatomegaly with multiple hepatic masses as previously seen. There is new high attenuation within the mass in the posterior aspect of the right hemi liver as well as several small foci of gas likely representing the site of prior biopsy complicated by some intralesional hemorrhage. Gallstones again noted within the gallbladder. Pancreas: Unremarkable. No pancreatic ductal dilatation or surrounding  inflammatory changes. Spleen: Normal in size without focal abnormality. Adrenals/Urinary Tract: Normal adrenal glands. No evidence of hydronephrosis or nephrolithiasis. 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 Stomach/Bowel: No focal bowel wall thickening or evidence of obstruction. Normal appendix in the right lower quadrant. Vascular/Lymphatic: Limited evaluation in the absence of intravenous contrast. No evidence of aneurysm. Reproductive: Dystrophic calcified uterine fibroids. No adnexal masses. Other: Interval development of moderate hemoperitoneum predominantly layering in the anatomic pelvis with a trace amount in the pericolic gutters. Musculoskeletal: No acute fracture or aggressive appearing lytic or blastic osseous lesion. IMPRESSION: 1. Positive for new intralesional hemorrhage and new small-moderate hemoperitoneum layering in the anatomic pelvis. Findings likely represent bleeding from the recent biopsy site. 2. Slightly increased bilateral lower lobe atelectasis. 3. Otherwise, no significant interval change compared to recent prior imaging. Electronically Signed: By: Jacqulynn Cadet M.D. On: 01/02/2019 10:32   Ir US Guide Bx Asp/drain  Result Date: 01/01/2019 INDICATION: No known primary, now with multiple liver lesions and masses worrisome for metastatic disease. Please from ultrasound-guided liver mass biopsy for tissue diagnostic purposes. EXAM: ULTRASOUND GUIDED LIVER LESION BIOPSY COMPARISON:  CT abdomen and pelvis - 12/31/2018; abdominal MRI - 01/01/2019 MEDICATIONS: None ANESTHESIA/SEDATION: Fentanyl 75 mcg IV; Versed 1.5 mg IV Total Moderate Sedation time:  13  Minutes. The patient's level of consciousness and vital signs were monitored continuously by radiology nursing throughout the procedure under my direct supervision. COMPLICATIONS: None immediate. PROCEDURE: Informed written consent was obtained from the patient after a  discussion of the risks, benefits and alternatives to treatment. The patient understands and consents the procedure. A timeout was performed prior to the initiation of the procedure. Ultrasound scanning was performed of the right upper abdominal quadrant demonstrates multiple mixed echogenic liver lesions and masses compatible with findings seen on preceding abdominal CT and MRI. Dominant lesion within the posterior aspect the right lobe of the liver was targeted for biopsy given lesion location and sonographic window. The procedure was planned. The right upper abdominal quadrant was prepped and draped in the usual sterile fashion. The overlying soft tissues were anesthetized with 1% lidocaine with epinephrine. A 17 gauge, 6.8 cm co-axial needle was advanced into a peripheral aspect of the lesion. This was followed by 5 core biopsies with an 18 gauge core device under direct ultrasound guidance. The coaxial needle tract was embolized with a small amount of Gel-Foam slurry and superficial hemostasis was obtained with manual compression. Post procedural scanning was negative for definitive area of hemorrhage or additional complication. A dressing was placed. The patient tolerated the procedure well without immediate post procedural complication. IMPRESSION: Technically successful ultrasound guided core needle biopsy of dominant mass within the posterior segment of the right lobe of the liver. Electronically Signed   By: Sandi Mariscal M.D.   On: 01/01/2019 17:34

## 2019-01-03 NOTE — Progress Notes (Signed)
Progress Note   Subjective  Chief Complaint: Liver mass, elevated LFTs, abdominal pain, acute blood loss anemia  This morning patient tells me that she started with a right-sided pain "where they took a biopsy from" that started yesterday afternoon which has been worsening.  Currently she has a heating pad on this area which is helping to dull the pain, still rated as a 6/10 which seems to shoot through her side.  Tells me she has still not had a bowel movement regardless of laxatives being given here.   Objective   Vital signs in last 24 hours: Temp:  [98.1 F (36.7 C)-101 F (38.3 C)] 98.1 F (36.7 C) (01/05 0800) Pulse Rate:  [111-124] 115 (01/05 0700) Resp:  [24-40] 33 (01/05 0700) BP: (119-169)/(63-100) 166/81 (01/05 0700) SpO2:  [93 %-100 %] 93 % (01/05 0700) Weight:  [87.9 kg] 87.9 kg (01/04 1030) Last BM Date: 01/01/19 General:   AA female in NAD Heart:  Regular rate and rhythm; no murmurs Lungs: Respirations even and unlabored, lungs CTA bilaterally Abdomen:  Soft, Moderate RUQ ttp and nondistended. Normal bowel sounds. Extremities:  Without edema. Neurologic:  Alert and oriented,  grossly normal neurologically. Psych:  Cooperative. Normal mood and affect.  Intake/Output from previous day: 01/04 0701 - 01/05 0700 In: 4932.2 [P.O.:940; I.V.:2336.1; Blood:840; IV Piggyback:816.1] Out: 3220 [Urine:1750]  Lab Results: Recent Labs    12/31/18 1626 01/01/19 0528 01/02/19 0525 01/02/19 1717 01/02/19 2351 01/03/19 0622  WBC 13.3* 13.4* 14.9*  --   --   --   HGB 11.1* 8.4* 6.9* 9.4* 8.9* 8.7*  HCT 35.4* 27.7* 23.0* 30.2* 27.8* 28.2*  PLT 419* 400 350  --   --   --    BMET Recent Labs    01/01/19 0528 01/02/19 0525 01/03/19 0622  NA 139 138 138  K 4.0 3.4* 3.5  CL 108 106 106  CO2 21* 23 22  GLUCOSE 114* 124* 101*  BUN 26* 14 6  CREATININE 0.76 0.61 0.48  CALCIUM 8.4* 7.9* 8.0*   Hepatic Function Latest Ref Rng & Units 01/03/2019 01/02/2019 01/01/2019    Total Protein 6.5 - 8.1 g/dL 5.7(L) 5.8(L) 6.2(L)  Albumin 3.5 - 5.0 g/dL 2.6(L) 2.7(L) 2.9(L)  AST 15 - 41 U/L 198(H) 662(H) 414(H)  ALT 0 - 44 U/L 383(H) 604(H) 393(H)  Alk Phosphatase 38 - 126 U/L 223(H) 233(H) 209(H)  Total Bilirubin 0.3 - 1.2 mg/dL 1.5(H) 0.9 0.8  Bilirubin, Direct 0.0 - 0.2 mg/dL - - -   PT/INR Recent Labs    01/01/19 0528 01/02/19 1057  LABPROT 15.1 15.2  INR 1.20 1.21    Studies/Results: Ct Abdomen Pelvis Wo Contrast  Addendum Date: 01/02/2019   ADDENDUM REPORT: 01/02/2019 10:53 ADDENDUM: These results were called by telephone at the time of interpretation on 01/02/2019 at 10:43 am to Dr. Niel Hummer , who verbally acknowledged these results. Electronically Signed   By: Jacqulynn Cadet M.D.   On: 01/02/2019 10:53   Result Date: 01/02/2019 CLINICAL DATA:  53 year old female with lethargy, decreased arousal and new onset anemia. Patient underwent liver biopsy yesterday. Evaluate for hemorrhage. EXAM: CT ABDOMEN AND PELVIS WITHOUT CONTRAST TECHNIQUE: Multidetector CT imaging of the abdomen and pelvis was performed following the standard protocol without IV contrast. COMPARISON:  Recent prior CT scan of the abdomen and pelvis 12/31/2018; MRI of the abdomen 01/01/2019 and CT scan of the chest also 01/01/2019 FINDINGS: Lower chest: Increased round atelectasis in the posterior aspect of the right  lower lobe persistent left lower lobe atelectasis. Trace right pleural effusion. The intracardiac blood pool is hypodense relative to the adjacent myocardium consistent with anemia. Hepatobiliary: Hepatomegaly with multiple hepatic masses as previously seen. There is new high attenuation within the mass in the posterior aspect of the right hemi liver as well as several small foci of gas likely representing the site of prior biopsy complicated by some intralesional hemorrhage. Gallstones again noted within the gallbladder. Pancreas: Unremarkable. No pancreatic ductal dilatation or  surrounding inflammatory changes. Spleen: Normal in size without focal abnormality. Adrenals/Urinary Tract: Normal adrenal glands. No evidence of hydronephrosis or nephrolithiasis. 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 Stomach/Bowel: No focal bowel wall thickening or evidence of obstruction. Normal appendix in the right lower quadrant. Vascular/Lymphatic: Limited evaluation in the absence of intravenous contrast. No evidence of aneurysm. Reproductive: Dystrophic calcified uterine fibroids. No adnexal masses. Other: Interval development of moderate hemoperitoneum predominantly layering in the anatomic pelvis with a trace amount in the pericolic gutters. Musculoskeletal: No acute fracture or aggressive appearing lytic or blastic osseous lesion. IMPRESSION: 1. Positive for new intralesional hemorrhage and new small-moderate hemoperitoneum layering in the anatomic pelvis. Findings likely represent bleeding from the recent biopsy site. 2. Slightly increased bilateral lower lobe atelectasis. 3. Otherwise, no significant interval change compared to recent prior imaging. Electronically Signed: By: Jacqulynn Cadet M.D. On: 01/02/2019 10:32   Ct Chest W Contrast  Result Date: 01/01/2019 CLINICAL DATA:  Midsternal chest pain. Multiple large liver lesions on previous recent imaging. EXAM: CT CHEST WITH CONTRAST TECHNIQUE: Multidetector CT imaging of the chest was performed during intravenous contrast administration. CONTRAST:  69m ISOVUE-300 IOPAMIDOL (ISOVUE-300) INJECTION 61% COMPARISON:  None. FINDINGS: Cardiovascular: Heart size upper normal. No pericardial effusion. No thoracic aortic aneurysm. Mediastinum/Nodes: Small mediastinal lymph nodes are evident without mediastinal lymphadenopathy. There is no hilar lymphadenopathy. The esophagus has normal imaging features. There is no axillary lymphadenopathy. Lungs/Pleura: The central tracheobronchial  airways are patent. 5 mm right upper lobe ground-glass nodule identified on 48/5. 3 mm left upper lobe nodule (27/5) may be cavitary. Ill-defined 8 mm nodule identified central left upper lobe on 45/5. Dependent atelectasis noted in the lower lobes bilaterally and associated airspace infection could not be excluded. There is no evidence for pleural effusion. Upper Abdomen: Large posterior right hepatic mass noted with other smaller hepatic lesions evident, better characterized on previous imaging. Musculoskeletal: No worrisome lytic or sclerotic osseous abnormality. IMPRESSION: 1. No evidence for primary malignancy in the thorax. 2. Scattered tiny bilateral pulmonary nodules are too small to characterize and nonspecific. Metastatic disease could certainly have this appearance and close attention on follow-up recommended. 3. Known hepatic lesions again noted. Electronically Signed   By: EMisty StanleyM.D.   On: 01/01/2019 13:18   Ir [place] Bx Asp/drain  Result Date: 01/01/2019 INDICATION: No known primary, now with multiple liver lesions and masses worrisome for metastatic disease. Please from ultrasound-guided liver mass biopsy for tissue diagnostic purposes. EXAM: ULTRASOUND GUIDED LIVER LESION BIOPSY COMPARISON:  CT abdomen and pelvis - 12/31/2018; abdominal MRI - 01/01/2019 MEDICATIONS: None ANESTHESIA/SEDATION: Fentanyl 75 mcg IV; Versed 1.5 mg IV Total Moderate Sedation time:  13 Minutes. The patient's level of consciousness and vital signs were monitored continuously by radiology nursing throughout the procedure under my direct supervision. COMPLICATIONS: None immediate. PROCEDURE: Informed written consent was obtained from the patient after a discussion of the risks, benefits and alternatives to treatment. The patient  understands and consents the procedure. A timeout was performed prior to the initiation of the procedure. Ultrasound scanning was performed of the right upper abdominal quadrant demonstrates  multiple mixed echogenic liver lesions and masses compatible with findings seen on preceding abdominal CT and MRI. Dominant lesion within the posterior aspect the right lobe of the liver was targeted for biopsy given lesion location and sonographic window. The procedure was planned. The right upper abdominal quadrant was prepped and draped in the usual sterile fashion. The overlying soft tissues were anesthetized with 1% lidocaine with epinephrine. A 17 gauge, 6.8 cm co-axial needle was advanced into a peripheral aspect of the lesion. This was followed by 5 core biopsies with an 18 gauge core device under direct ultrasound guidance. The coaxial needle tract was embolized with a small amount of Gel-Foam slurry and superficial hemostasis was obtained with manual compression. Post procedural scanning was negative for definitive area of hemorrhage or additional complication. A dressing was placed. The patient tolerated the procedure well without immediate post procedural complication. IMPRESSION: Technically successful ultrasound guided core needle biopsy of dominant mass within the posterior segment of the right lobe of the liver. Electronically Signed   By: Sandi Mariscal M.D.   On: 01/01/2019 17:34       Assessment / Plan:   Assessment: 1.  Elevated LFTs: Started to trend down today, other than T bili which increased from 0.9--> 1.5 2.  IDA: BUN increased today with a hemoglobin holding stable, will defer to IR as this is likely still from bleeding after biopsy 3.  Liver mass  Plan: 1.  Continue to trend LFTs 2.  Per Dr. Bryan Lemma check APAP level, salicylates 3.  Appreciate IR consultation in regards to intrahepatic/retroperitoneal bleeding 4.  Continue to monitor H&H with transfusion as needed 5.  Please await any further recommendations from Dr. Bryan Lemma later today.  Thank you for kind consultation.   LOS: 0 days   Levin Erp  01/03/2019, 9:11 AM

## 2019-01-03 NOTE — Progress Notes (Signed)
PROGRESS NOTE    Zacaria Pousson  XFG:[id number] DOB: January 06, 1966 DOA: 12/31/2018 PCP: Patient, No Pcp Per    Brief Narrative:  53 year old with no significant past medical history who presented to the emergency department complaining of abdominal pain that started the night prior to admission she also report nausea vomiting restarted the day prior to admission. Evaluation in the ED showed creased transaminases alkaline phosphatase 265, leukocytosis at 13,.  Right upper quadrant ultrasound was concerning for large solid-appearing masses with concern for possible invasion into the gallbladder. CT abdomen show numerous nonspecific liver masses without extrahepatic invasion possible metastatic versus primary hepatocellular or biliary malignancy.   Assessment & Plan:   Principal Problem:   Liver masses Active Problems:   Leukocytosis   Thrombocytosis (HCC)   Normocytic anemia   Hemoperitoneum   Abdominal pain   Goals of care, counseling/discussion   Elevated AST (SGOT)   Acute blood loss anemia  1-Liver masses; metastatic cancer unknown primary;  blood cultures no growth less than 24 hours.  Patient underwent liver biopsy 01-01-2019 Alpha-fetoprotein level and CEA level negative HIV negative. Hepatitis panel negative.  CT chest multiples small pulmonary nodule.  Liver biopsy pending.  Oncology consulted. He will consult radiation oncology.  For abdominal pain, will start oxycodone. Offer palliative care consult for pain management , patient decline.   Leukocytosis thrombocytosis Could be reactive. blood cultures; no growth to date.  On IV Unasyn.  Repeat labs in am.   Anemia;acute blood loss anemia; intralesional hemorrhage and retroperitoneum.  Also Iron deficiency anemia.  Hb this am drop to 6.9. two units of PRBC ordered. Hb increase to 8  Transfer to step down  unit.  CT abdomen ordered; showed new intralesional hemorrhage and new small moderate retroperitoneum in the  pelvis.  Cycle hb.  IR following, recommend support care.  GI consulted.  I informed, Dr Ninfa Linden CT abdomen results. He recommend Blood transfusion and monitor hb.  Hb has been stable at 8.   Fever;  blood culture; no growth.  Might be reactive.  On IV antibiotics.  urine culture; insignificant growth.  Influenza negative.   Atelectasis;  Incentive spirometry.   Hypokalemia; replete orally  Transaminases; from liver mass. Stable.   Estimated body mass index is 33.26 kg/m as calculated from the following:   Height as of this encounter: 5\' 4"  (1.626 m).   Weight as of this encounter: 87.9 kg.   DVT prophylaxis: SCDs Code Status: Full code Family Communication: Mother at bedside.  Disposition Plan: Remain in the hospital for IV antibiotics and further evaluation of multiple liver mass, pain management.  Consultants:   Surgery  I R   Procedures:  Liver biopsy Antimicrobials:   Unasyn  Subjective: She is anxious, worry.  Report abdominal pain, radiates to her back.   Objective: Vitals:   01/03/19 0410 01/03/19 0500 01/03/19 0600 01/03/19 0700  BP:  (!) 155/75 (!) 152/82 (!) 166/81  Pulse:  (!) 112 (!) 112 (!) 115  Resp:  (!) 27 (!) 29 (!) 33  Temp: 99.4 F (37.4 C)     TempSrc: Oral     SpO2:  93% 95% 93%  Weight:      Height:        Intake/Output Summary (Last 24 hours) at 01/03/2019 0746 Last data filed at 01/03/2019 0500 Gross per 24 hour  Intake 4932.2 ml  Output 1750 ml  Net 3182.2 ml   Filed Weights   12/31/18 1555 01/02/19 1030  Weight: 84.8 kg  87.9 kg    Examination:  General exam: NAD Respiratory system: CTA Cardiovascular system: S 1, S 2 RRR Gastrointestinal system: BS present, soft, nt Central nervous system: alert.  Extremities: Symmetric power.  Skin: No rashes/   Data Reviewed: I have personally reviewed following labs and imaging studies  CBC: Recent Labs  Lab 12/31/18 1626 01/01/19 0528 01/02/19 0525 01/02/19 1717  01/02/19 2351 01/03/19 0622  WBC 13.3* 13.4* 14.9*  --   --   --   NEUTROABS  --  11.3*  --   --   --   --   HGB 11.1* 8.4* 6.9* 9.4* 8.9* 8.7*  HCT 35.4* 27.7* 23.0* 30.2* 27.8* 28.2*  MCV 90.8 92.3 93.9  --   --   --   PLT 419* 400 350  --   --   --    Basic Metabolic Panel: Recent Labs  Lab 12/31/18 1626 01/01/19 0528 01/02/19 0525  NA 137 139 138  K 3.5 4.0 3.4*  CL 104 108 106  CO2 25 21* 23  GLUCOSE 111* 114* 124*  BUN 26* 26* 14  CREATININE 0.63 0.76 0.61  CALCIUM 9.2 8.4* 7.9*   GFR: Estimated Creatinine Clearance: 89.3 mL/min (by C-G formula based on SCr of 0.61 mg/dL). Liver Function Tests: Recent Labs  Lab 12/31/18 2006 01/01/19 0528 01/02/19 0525  AST 95* 414* 662*  ALT 98* 393* 604*  ALKPHOS 265* 209* 233*  BILITOT 0.6 0.8 0.9  PROT 7.0 6.2* 5.8*  ALBUMIN 3.3* 2.9* 2.7*   Recent Labs  Lab 12/31/18 1626  LIPASE 25   No results for input(s): AMMONIA in the last 168 hours. Coagulation Profile: Recent Labs  Lab 01/01/19 0528 01/02/19 1057  INR 1.20 1.21   Cardiac Enzymes: Recent Labs  Lab 12/31/18 1626 12/31/18 2006  TROPONINI <0.03 <0.03   BNP (last 3 results) No results for input(s): PROBNP in the last 8760 hours. HbA1C: No results for input(s): HGBA1C in the last 72 hours. CBG: Recent Labs  Lab 01/01/19 0752 01/02/19 0731  GLUCAP 101* 112*   Lipid Profile: No results for input(s): CHOL, HDL, LDLCALC, TRIG, CHOLHDL, LDLDIRECT in the last 72 hours. Thyroid Function Tests: No results for input(s): TSH, T4TOTAL, FREET4, T3FREE, THYROIDAB in the last 72 hours. Anemia Panel: Recent Labs    01/02/19 0525  VITAMINB12 1,467*  FOLATE 12.2  FERRITIN 776*  TIBC 533*  IRON 19*  RETICCTPCT 2.1   Sepsis Labs: No results for input(s): PROCALCITON, LATICACIDVEN in the last 168 hours.  Recent Results (from the past 240 hour(s))  Culture, blood (routine x 2)     Status: None (Preliminary result)   Collection Time: 01/01/19 11:09 AM    Result Value Ref Range Status   Specimen Description BLOOD RIGHT HAND  Final   Special Requests   Final    BOTTLES DRAWN AEROBIC AND ANAEROBIC Blood Culture adequate volume Performed at [facility] [address], [place], [place] [address]    Culture NO GROWTH 2 DAYS  Final   Report Status PENDING  Incomplete  Culture, blood (routine x 2)     Status: None (Preliminary result)   Collection Time: 01/01/19 11:10 AM  Result Value Ref Range Status   Specimen Description BLOOD LEFT ARM  Final   Special Requests   Final    BOTTLES DRAWN AEROBIC AND ANAEROBIC Blood Culture adequate volume Performed at [facility] [address] [place],  [address]    Culture NO  GROWTH 2 DAYS  Final   Report Status PENDING  Incomplete  Urine Culture     Status: Abnormal   Collection Time: 01/01/19  2:40 PM  Result Value Ref Range Status   Specimen Description   Final    URINE, CLEAN CATCH Performed at [facility], [place] [address], [place], [place] [address]    Special Requests   Final    NONE Performed at [facility], [place] [address] [place], [place] [address]    Culture (A)  Final    <10,000 COLONIES/mL INSIGNIFICANT GROWTH Performed at [facility] [address] [place], [place] [address]    Report Status 01/02/2019 FINAL  Final  MRSA PCR Screening     Status: None   Collection Time: 01/02/19 10:30 AM  Result Value Ref Range Status   MRSA by PCR NEGATIVE NEGATIVE Final    Comment:        The GeneXpert MRSA Assay (FDA approved for NASAL specimens only), is one component of a comprehensive MRSA colonization surveillance program. It is not intended to diagnose MRSA infection nor to guide or monitor treatment for MRSA infections. Performed at [facility], [place] [address], [place],  [address]          Radiology Studies: Ct Abdomen Pelvis Wo Contrast  Addendum  Date: 01/02/2019   ADDENDUM REPORT: 01/02/2019 10:53 ADDENDUM: These results were called by telephone at the time of interpretation on 01/02/2019 at 10:43 am to Dr. Niel Hummer , who verbally acknowledged these results. Electronically Signed   By: Jacqulynn Cadet M.D.   On: 01/02/2019 10:53   Result Date: 01/02/2019 CLINICAL DATA:  53 year old female with lethargy, decreased arousal and new onset anemia. Patient underwent liver biopsy yesterday. Evaluate for hemorrhage. EXAM: CT ABDOMEN AND PELVIS WITHOUT CONTRAST TECHNIQUE: Multidetector CT imaging of the abdomen and pelvis was performed following the standard protocol without IV contrast. COMPARISON:  Recent prior CT scan of the abdomen and pelvis 12/31/2018; MRI of the abdomen 01/01/2019 and CT scan of the chest also 01/01/2019 FINDINGS: Lower chest: Increased round atelectasis in the posterior aspect of the right lower lobe persistent left lower lobe atelectasis. Trace right pleural effusion. The intracardiac blood pool is hypodense relative to the adjacent myocardium consistent with anemia. Hepatobiliary: Hepatomegaly with multiple hepatic masses as previously seen. There is new high attenuation within the mass in the posterior aspect of the right hemi liver as well as several small foci of gas likely representing the site of prior biopsy complicated by some intralesional hemorrhage. Gallstones again noted within the gallbladder. Pancreas: Unremarkable. No pancreatic ductal dilatation or surrounding inflammatory changes. Spleen: Normal in size without focal abnormality. Adrenals/Urinary Tract: Normal adrenal glands. No evidence of hydronephrosis or nephrolithiasis. 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 Stomach/Bowel: No focal bowel wall thickening or evidence of obstruction. Normal appendix in the right lower quadrant. Vascular/Lymphatic: Limited evaluation in the absence of intravenous  contrast. No evidence of aneurysm. Reproductive: Dystrophic calcified uterine fibroids. No adnexal masses. Other: Interval development of moderate hemoperitoneum predominantly layering in the anatomic pelvis with a trace amount in the pericolic gutters. Musculoskeletal: No acute fracture or aggressive appearing lytic or blastic osseous lesion. IMPRESSION: 1. Positive for new intralesional hemorrhage and new small-moderate hemoperitoneum layering in the anatomic pelvis. Findings likely represent bleeding from the recent biopsy site. 2. Slightly increased bilateral lower lobe atelectasis. 3. Otherwise, no significant interval  change compared to recent prior imaging. Electronically Signed: By: Jacqulynn Cadet M.D. On: 01/02/2019 10:32   Ct Chest W Contrast  Result Date: 01/01/2019 CLINICAL DATA:  Midsternal chest pain. Multiple large liver lesions on previous recent imaging. EXAM: CT CHEST WITH CONTRAST TECHNIQUE: Multidetector CT imaging of the chest was performed during intravenous contrast administration. CONTRAST:  69mL ISOVUE-300 IOPAMIDOL (ISOVUE-300) INJECTION 61% COMPARISON:  None. FINDINGS: Cardiovascular: Heart size upper normal. No pericardial effusion. No thoracic aortic aneurysm. Mediastinum/Nodes: Small mediastinal lymph nodes are evident without mediastinal lymphadenopathy. There is no hilar lymphadenopathy. The esophagus has normal imaging features. There is no axillary lymphadenopathy. Lungs/Pleura: The central tracheobronchial airways are patent. 5 mm right upper lobe ground-glass nodule identified on 48/5. 3 mm left upper lobe nodule (27/5) may be cavitary. Ill-defined 8 mm nodule identified central left upper lobe on 45/5. Dependent atelectasis noted in the lower lobes bilaterally and associated airspace infection could not be excluded. There is no evidence for pleural effusion. Upper Abdomen: Large posterior right hepatic mass noted with other smaller hepatic lesions evident, better  characterized on previous imaging. Musculoskeletal: No worrisome lytic or sclerotic osseous abnormality. IMPRESSION: 1. No evidence for primary malignancy in the thorax. 2. Scattered tiny bilateral pulmonary nodules are too small to characterize and nonspecific. Metastatic disease could certainly have this appearance and close attention on follow-up recommended. 3. Known hepatic lesions again noted. Electronically Signed   By: Misty Stanley M.D.   On: 01/01/2019 13:18   Mr Abdomen W Wo Contrast  Result Date: 01/01/2019 CLINICAL DATA:  Followup multiple liver lesions. EXAM: MRI ABDOMEN WITHOUT AND WITH CONTRAST TECHNIQUE: Multiplanar multisequence MR imaging of the abdomen was performed both before and after the administration of intravenous contrast. CONTRAST:  10 cc Gadavist COMPARISON:  Ultrasound abdomen, 12/31/2018 and CT abdomen 12/31/2018. FINDINGS: Lower chest: Bibasilar atelectasis is noted. No obvious pulmonary lesions. An 8 mm right lower lobe pulmonary nodule was noted on the CT scan. The distal esophagus is grossly normal. Hepatobiliary: As demonstrated on the ultrasound and CT scan there are innumerable large masses throughout both lobes of the liver. 8 cm segment 8 liver lesion appears to have a central scar. Similar appearance of the 9 cm segment 3 lesion which has central necrosis and hemorrhage. 9.5 cm segment 6 lesion demonstrates extensive necrosis with variable areas of hemorrhage. Gallstones are again noted in the gallbladder but no definite findings for acute cholecystitis. Pancreas:  No mass, inflammation or ductal dilatation. Spleen:  Normal size.  No focal lesions. Adrenals/Urinary Tract: The adrenal glands and kidneys are unremarkable. Simple left renal cyst is noted. Stomach/Bowel: No evidence of a gastric mass. The small bowel and colon are grossly normal. No obvious colon cancer is demonstrated on this exam or the CT scan. Vascular/Lymphatic: The aorta and branch vessels are patent.  The major venous structures are patent. No mesenteric mass or adenopathy. No retroperitoneal adenopathy. Other:  No ascites or abdominal wall hernia. Musculoskeletal: No significant bony findings. IMPRESSION: 1. Numerous large masses throughout both lobes of the liver, likely metastatic disease but possible multifocal primary hepatic tumors. No obvious primary neoplasm is identified. Specifically, I do not see any evidence of a gastric or pancreatic tumor or colon cancer. No obvious breast masses. 2. Recommend consideration for ultrasound-guided liver biopsy and chest CT to exclude a pulmonary neoplasm. Electronically Signed   By: Marijo Sanes M.D.   On: 01/01/2019 09:29   Ir US Guide Bx Asp/drain  Result Date: 01/01/2019 INDICATION: No known  primary, now with multiple liver lesions and masses worrisome for metastatic disease. Please from ultrasound-guided liver mass biopsy for tissue diagnostic purposes. EXAM: ULTRASOUND GUIDED LIVER LESION BIOPSY COMPARISON:  CT abdomen and pelvis - 12/31/2018; abdominal MRI - 01/01/2019 MEDICATIONS: None ANESTHESIA/SEDATION: Fentanyl 75 mcg IV; Versed 1.5 mg IV Total Moderate Sedation time:  13 Minutes. The patient's level of consciousness and vital signs were monitored continuously by radiology nursing throughout the procedure under my direct supervision. COMPLICATIONS: None immediate. PROCEDURE: Informed written consent was obtained from the patient after a discussion of the risks, benefits and alternatives to treatment. The patient understands and consents the procedure. A timeout was performed prior to the initiation of the procedure. Ultrasound scanning was performed of the right upper abdominal quadrant demonstrates multiple mixed echogenic liver lesions and masses compatible with findings seen on preceding abdominal CT and MRI. Dominant lesion within the posterior aspect the right lobe of the liver was targeted for biopsy given lesion location and sonographic window. The  procedure was planned. The right upper abdominal quadrant was prepped and draped in the usual sterile fashion. The overlying soft tissues were anesthetized with 1% lidocaine with epinephrine. A 17 gauge, 6.8 cm co-axial needle was advanced into a peripheral aspect of the lesion. This was followed by 5 core biopsies with an 18 gauge core device under direct ultrasound guidance. The coaxial needle tract was embolized with a small amount of Gel-Foam slurry and superficial hemostasis was obtained with manual compression. Post procedural scanning was negative for definitive area of hemorrhage or additional complication. A dressing was placed. The patient tolerated the procedure well without immediate post procedural complication. IMPRESSION: Technically successful ultrasound guided core needle biopsy of dominant mass within the posterior segment of the right lobe of the liver. Electronically Signed   By: Sandi Mariscal M.D.   On: 01/01/2019 17:34        Scheduled Meds: . sodium chloride   Intravenous Once  . pantoprazole (PROTONIX) IV  40 mg Intravenous Q12H  . polyethylene glycol  17 g Oral BID  . senna-docusate  1 tablet Oral BID   Continuous Infusions: . sodium chloride 100 mL/hr at 01/03/19 0500  . ampicillin-sulbactam (UNASYN) IV 3 g (01/03/19 0607)     LOS: 0 days    Time spent: 35 minutes    Elmarie Shiley, MD [facility] Pager [phone]  If 7PM-7AM, please contact night-coverage [email or web address] Password [facility] 01/03/2019, 7:46 AM

## 2019-01-03 NOTE — Progress Notes (Addendum)
Referring Physician(s): Dr Rudolpho Sevin   Supervising Physician: Aletta Edouard  Patient Status:  [facility] - In-pt  Chief Complaint:  1/3 IR Procedure:  Technically successful US guided biopsy ofIndeterminate lesion within the right lobe of the liver. Post biopsy hemoperitoneum  Subjective:  Hg stable today Hg 8.8 at 1136 this am Still with mild abd pain Some back pain today Heating pad helping VSS Tolerating diet    Allergies: Patient has no known allergies.  Medications: Prior to Admission medications   Not on File     Vital Signs: BP (!) 142/82 (BP Location: Left Arm)   Pulse (!) 110   Temp 98.1 F (36.7 C) (Oral)   Resp (!) 22   Ht 5\' 4"  (1.626 m)   Wt 193 lb 12.6 oz (87.9 kg)   SpO2 97%   BMI 33.26 kg/m   Physical Exam Vitals signs reviewed.  Cardiovascular:     Rate and Rhythm: Regular rhythm. Tachycardia present.  Pulmonary:     Effort: Pulmonary effort is normal.  Abdominal:     Palpations: Abdomen is soft.     Tenderness: There is abdominal tenderness.  Skin:    General: Skin is warm and dry.  Neurological:     Mental Status: She is oriented to person, place, and time.  Psychiatric:        Mood and Affect: Mood normal.        Behavior: Behavior normal.     Imaging: Ct Abdomen Pelvis Wo Contrast  Addendum Date: 01/02/2019   ADDENDUM REPORT: 01/02/2019 10:53 ADDENDUM: These results were called by telephone at the time of interpretation on 01/02/2019 at 10:43 am to Dr. Niel Hummer , who verbally acknowledged these results. Electronically Signed   By: Jacqulynn Cadet M.D.   On: 01/02/2019 10:53   Result Date: 01/02/2019 CLINICAL DATA:  53 year old female with lethargy, decreased arousal and new onset anemia. Patient underwent liver biopsy yesterday. Evaluate for hemorrhage. EXAM: CT ABDOMEN AND PELVIS WITHOUT CONTRAST TECHNIQUE: Multidetector CT imaging of the abdomen and pelvis was performed following the standard protocol without IV  contrast. COMPARISON:  Recent prior CT scan of the abdomen and pelvis 12/31/2018; MRI of the abdomen 01/01/2019 and CT scan of the chest also 01/01/2019 FINDINGS: Lower chest: Increased round atelectasis in the posterior aspect of the right lower lobe persistent left lower lobe atelectasis. Trace right pleural effusion. The intracardiac blood pool is hypodense relative to the adjacent myocardium consistent with anemia. Hepatobiliary: Hepatomegaly with multiple hepatic masses as previously seen. There is new high attenuation within the mass in the posterior aspect of the right hemi liver as well as several small foci of gas likely representing the site of prior biopsy complicated by some intralesional hemorrhage. Gallstones again noted within the gallbladder. Pancreas: Unremarkable. No pancreatic ductal dilatation or surrounding inflammatory changes. Spleen: Normal in size without focal abnormality. Adrenals/Urinary Tract: Normal adrenal glands. No evidence of hydronephrosis or nephrolithiasis. 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 Stomach/Bowel: No focal bowel wall thickening or evidence of obstruction. Normal appendix in the right lower quadrant. Vascular/Lymphatic: Limited evaluation in the absence of intravenous contrast. No evidence of aneurysm. Reproductive: Dystrophic calcified uterine fibroids. No adnexal masses. Other: Interval development of moderate hemoperitoneum predominantly layering in the anatomic pelvis with a trace amount in the pericolic gutters. Musculoskeletal: No acute fracture or aggressive appearing lytic or blastic osseous lesion. IMPRESSION: 1. Positive for new intralesional hemorrhage and new  small-moderate hemoperitoneum layering in the anatomic pelvis. Findings likely represent bleeding from the recent biopsy site. 2. Slightly increased bilateral lower lobe atelectasis. 3. Otherwise, no significant interval change  compared to recent prior imaging. Electronically Signed: By: Jacqulynn Cadet M.D. On: 01/02/2019 10:32   Dg Chest 2 View  Result Date: 12/31/2018 CLINICAL DATA:  Chest pain radiating into back today.  Vomiting. EXAM: CHEST - 2 VIEW COMPARISON:  None. FINDINGS: Heart size is normal. Lung volumes are low. Mild bibasilar atelectasis is present. No significant airspace consolidation is present. There is no edema or effusion. IMPRESSION: Low lung volumes and mild bibasilar atelectasis. No acute cardiopulmonary disease. Electronically Signed   By: San Morelle M.D.   On: 12/31/2018 16:22   Ct Chest W Contrast  Result Date: 01/01/2019 CLINICAL DATA:  Midsternal chest pain. Multiple large liver lesions on previous recent imaging. EXAM: CT CHEST WITH CONTRAST TECHNIQUE: Multidetector CT imaging of the chest was performed during intravenous contrast administration. CONTRAST:  37mL ISOVUE-300 IOPAMIDOL (ISOVUE-300) INJECTION 61% COMPARISON:  None. FINDINGS: Cardiovascular: Heart size upper normal. No pericardial effusion. No thoracic aortic aneurysm. Mediastinum/Nodes: Small mediastinal lymph nodes are evident without mediastinal lymphadenopathy. There is no hilar lymphadenopathy. The esophagus has normal imaging features. There is no axillary lymphadenopathy. Lungs/Pleura: The central tracheobronchial airways are patent. 5 mm right upper lobe ground-glass nodule identified on 48/5. 3 mm left upper lobe nodule (27/5) may be cavitary. Ill-defined 8 mm nodule identified central left upper lobe on 45/5. Dependent atelectasis noted in the lower lobes bilaterally and associated airspace infection could not be excluded. There is no evidence for pleural effusion. Upper Abdomen: Large posterior right hepatic mass noted with other smaller hepatic lesions evident, better characterized on previous imaging. Musculoskeletal: No worrisome lytic or sclerotic osseous abnormality. IMPRESSION: 1. No evidence for primary  malignancy in the thorax. 2. Scattered tiny bilateral pulmonary nodules are too small to characterize and nonspecific. Metastatic disease could certainly have this appearance and close attention on follow-up recommended. 3. Known hepatic lesions again noted. Electronically Signed   By: Misty Stanley M.D.   On: 01/01/2019 13:18   Mr Abdomen W Wo Contrast  Result Date: 01/01/2019 CLINICAL DATA:  Followup multiple liver lesions. EXAM: MRI ABDOMEN WITHOUT AND WITH CONTRAST TECHNIQUE: Multiplanar multisequence MR imaging of the abdomen was performed both before and after the administration of intravenous contrast. CONTRAST:  10 cc Gadavist COMPARISON:  Ultrasound abdomen, 12/31/2018 and CT abdomen 12/31/2018. FINDINGS: Lower chest: Bibasilar atelectasis is noted. No obvious pulmonary lesions. An 8 mm right lower lobe pulmonary nodule was noted on the CT scan. The distal esophagus is grossly normal. Hepatobiliary: As demonstrated on the ultrasound and CT scan there are innumerable large masses throughout both lobes of the liver. 8 cm segment 8 liver lesion appears to have a central scar. Similar appearance of the 9 cm segment 3 lesion which has central necrosis and hemorrhage. 9.5 cm segment 6 lesion demonstrates extensive necrosis with variable areas of hemorrhage. Gallstones are again noted in the gallbladder but no definite findings for acute cholecystitis. Pancreas:  No mass, inflammation or ductal dilatation. Spleen:  Normal size.  No focal lesions. Adrenals/Urinary Tract: The adrenal glands and kidneys are unremarkable. Simple left renal cyst is noted. Stomach/Bowel: No evidence of a gastric mass. The small bowel and colon are grossly normal. No obvious colon cancer is demonstrated on this exam or the CT scan. Vascular/Lymphatic: The aorta and branch vessels are patent. The major venous structures are patent. No  mesenteric mass or adenopathy. No retroperitoneal adenopathy. Other:  No ascites or abdominal wall  hernia. Musculoskeletal: No significant bony findings. IMPRESSION: 1. Numerous large masses throughout both lobes of the liver, likely metastatic disease but possible multifocal primary hepatic tumors. No obvious primary neoplasm is identified. Specifically, I do not see any evidence of a gastric or pancreatic tumor or colon cancer. No obvious breast masses. 2. Recommend consideration for ultrasound-guided liver biopsy and chest CT to exclude a pulmonary neoplasm. Electronically Signed   By: Marijo Sanes M.D.   On: 01/01/2019 09:29   Ct Abdomen Pelvis W Contrast  Result Date: 12/31/2018 CLINICAL DATA:  53 y/o F; epigastric pain radiating to the back with nausea and vomiting for 3 days. EXAM: CT ABDOMEN AND PELVIS WITH CONTRAST TECHNIQUE: Multidetector CT imaging of the abdomen and pelvis was performed using the standard protocol following bolus administration of intravenous contrast. CONTRAST:  121mL ISOVUE-300 IOPAMIDOL (ISOVUE-300) INJECTION 61% COMPARISON:  None. FINDINGS: Lower chest: 8 mm right lower lobe pulmonary nodule (series 4, image 1). Hepatobiliary: Cholelithiasis. No gallbladder wall thickening or biliary ductal dilatation. Numerous liver masses occupying approximately 70% of liver parenchyma measuring up to 9.2 x 9.1 cm in the right lobe of liver (series 2, image 24). Several masses are peripheral and exophytic, however, no extrahepatic invasion is identified. Pancreas: Unremarkable. No pancreatic ductal dilatation or surrounding inflammatory changes. Spleen: Normal in size without focal abnormality. Adrenals/Urinary Tract: Normal adrenal glands. 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 No additional focal kidney lesion, urinary stone disease, hydronephrosis. Normal bladder. Stomach/Bowel: Stomach is within normal limits. Appendix appears normal. No evidence of bowel wall thickening, distention, or inflammatory changes. Vascular/Lymphatic: No significant  vascular findings are present. No enlarged abdominal or pelvic lymph nodes. Reproductive: Multiple uterine fibroids measuring up to 4.2 cm. No adnexal mass identified. Other: No abdominal wall hernia or abnormality. No abdominopelvic ascites. Musculoskeletal: No acute or significant osseous findings. IMPRESSION: 1. Numerous nonspecific liver masses measuring up to 9.2 cm. No extrahepatic invasion is identified. Some differential considerations include metastasis, primary hepatocellular or biliary malignancy, hepatic adenomatosis, and lymphoma. MRI of the abdomen with and without contrast may help to characterize and tissue sampling is recommended. 2. 8 mm right lower lobe pulmonary nodule. 3. Cholelithiasis.  No findings of acute cholecystitis. 4. Multiple uterine fibroids measuring up to 4.2 cm. 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 Electronically Signed   By: Kristine Garbe M.D.   On: 12/31/2018 21:42   US Abdomen Limited  Result Date: 12/31/2018 CLINICAL DATA:  Sudden onset epigastric pain with nausea and vomiting. EXAM: ULTRASOUND ABDOMEN LIMITED RIGHT UPPER QUADRANT COMPARISON:  None. FINDINGS: Gallbladder: Small gallstones and sludge are noted. No wall thickening, however there is a focal irregularity of the gallbladder wall adjacent to a large mass in the right hepatic lobe. No sonographic Murphy sign noted by sonographer. Common bile duct: Diameter: 1 mm, normal. Liver: There is a 6.2 x 4.7 x 4.0 cm heterogeneous mass with internal vascularity in the right hepatic lobe adjacent to the gallbladder. There is also a 5.5 x 2.1 x 2.9 cm heterogeneous mass with internal vascularity in the left hepatic lobe. Heterogeneous parenchymal echogenicity. Portal vein is patent on color Doppler imaging with normal direction of blood flow towards the liver. IMPRESSION: 1. Large solid-appearing heterogeneous masses in the left and right hepatic lobes. The right hepatic lobe mass may be invading the  gallbladder given adjacent gallbladder wall irregularity. Findings are concerning for  malignancy. Recommend CT of the abdomen and pelvis with contrast for further evaluation. 2. Cholelithiasis and sludge without sonographic evidence of acute cholecystitis. Electronically Signed   By: Titus Dubin M.D.   On: 12/31/2018 20:04   Ir US Guide Bx Asp/drain  Result Date: 01/01/2019 INDICATION: No known primary, now with multiple liver lesions and masses worrisome for metastatic disease. Please from ultrasound-guided liver mass biopsy for tissue diagnostic purposes. EXAM: ULTRASOUND GUIDED LIVER LESION BIOPSY COMPARISON:  CT abdomen and pelvis - 12/31/2018; abdominal MRI - 01/01/2019 MEDICATIONS: None ANESTHESIA/SEDATION: Fentanyl 75 mcg IV; Versed 1.5 mg IV Total Moderate Sedation time:  13 Minutes. The patient's level of consciousness and vital signs were monitored continuously by radiology nursing throughout the procedure under my direct supervision. COMPLICATIONS: None immediate. PROCEDURE: Informed written consent was obtained from the patient after a discussion of the risks, benefits and alternatives to treatment. The patient understands and consents the procedure. A timeout was performed prior to the initiation of the procedure. Ultrasound scanning was performed of the right upper abdominal quadrant demonstrates multiple mixed echogenic liver lesions and masses compatible with findings seen on preceding abdominal CT and MRI. Dominant lesion within the posterior aspect the right lobe of the liver was targeted for biopsy given lesion location and sonographic window. The procedure was planned. The right upper abdominal quadrant was prepped and draped in the usual sterile fashion. The overlying soft tissues were anesthetized with 1% lidocaine with epinephrine. A 17 gauge, 6.8 cm co-axial needle was advanced into a peripheral aspect of the lesion. This was followed by 5 core biopsies with an 18 gauge core device under  direct ultrasound guidance. The coaxial needle tract was embolized with a small amount of Gel-Foam slurry and superficial hemostasis was obtained with manual compression. Post procedural scanning was negative for definitive area of hemorrhage or additional complication. A dressing was placed. The patient tolerated the procedure well without immediate post procedural complication. IMPRESSION: Technically successful ultrasound guided core needle biopsy of dominant mass within the posterior segment of the right lobe of the liver. Electronically Signed   By: Sandi Mariscal M.D.   On: 01/01/2019 17:34    Labs:  CBC: Recent Labs    12/31/18 1626 01/01/19 0528 01/02/19 0525 01/02/19 1717 01/02/19 2351 01/03/19 0622 01/03/19 1136  WBC 13.3* 13.4* 14.9*  --   --   --   --   HGB 11.1* 8.4* 6.9* 9.4* 8.9* 8.7* 8.8*  HCT 35.4* 27.7* 23.0* 30.2* 27.8* 28.2* 28.1*  PLT 419* 400 350  --   --   --   --     COAGS: Recent Labs    01/01/19 0528 01/02/19 1057  INR 1.20 1.21    BMP: Recent Labs    12/31/18 1626 01/01/19 0528 01/02/19 0525 01/03/19 0622  NA 137 139 138 138  K 3.5 4.0 3.4* 3.5  CL 104 108 106 106  CO2 25 21* 23 22  GLUCOSE 111* 114* 124* 101*  BUN 26* 26* 14 6  CALCIUM 9.2 8.4* 7.9* 8.0*  CREATININE 0.63 0.76 0.61 0.48  GFRNONAA >60 >60 >60 >60  GFRAA >60 >60 >60 >60    LIVER FUNCTION TESTS: Recent Labs    12/31/18 2006 01/01/19 0528 01/02/19 0525 01/03/19 0622  BILITOT 0.6 0.8 0.9 1.5*  AST 95* 414* 662* 198*  ALT 98* 393* 604* 383*  ALKPHOS 265* 209* 233* 223*  PROT 7.0 6.2* 5.8* 5.7*  ALBUMIN 3.3* 2.9* 2.7* 2.6*    Assessment and  Plan:  HR 110 abd pain less today Back pain on right-- heat helps Hg stable 8.8 at 1130 today Will follow Call if need [place] Dr Kathlene Cote [phone]  Electronically Signed: Lavonia Drafts, PA-C 01/03/2019, 12:31 PM   I spent a total of 15 Minutes at the the patient's bedside AND on the patient's hospital floor or unit,  greater than 50% of which was counseling/coordinating care for liver mass bx/post hemoperitoneum

## 2019-01-04 ENCOUNTER — Ambulatory Visit
Admit: 2019-01-04 | Discharge: 2019-01-04 | Disposition: A | Discharge status: Home / Self Care | Payer: Self-pay | Source: Ambulatory Visit | Attending: Radiation Oncology | Admitting: Radiation Oncology

## 2019-01-04 ENCOUNTER — Encounter (HOSPITAL_COMMUNITY): Payer: Self-pay | Admitting: Radiation Oncology

## 2019-01-04 DIAGNOSIS — K91840 Postprocedural hemorrhage and hematoma of a digestive system organ or structure following a digestive system procedure: Secondary | ICD-10-CM | POA: Diagnosis present

## 2019-01-04 DIAGNOSIS — J986 Disorders of diaphragm: Secondary | ICD-10-CM | POA: Diagnosis present

## 2019-01-04 DIAGNOSIS — R402364 Coma scale, best motor response, obeys commands, 24 hours or more after hospital admission: Secondary | ICD-10-CM | POA: Diagnosis not present

## 2019-01-04 DIAGNOSIS — D62 Acute posthemorrhagic anemia: Secondary | ICD-10-CM | POA: Diagnosis not present

## 2019-01-04 DIAGNOSIS — R7989 Other specified abnormal findings of blood chemistry: Secondary | ICD-10-CM | POA: Diagnosis not present

## 2019-01-04 DIAGNOSIS — S36119A Unspecified injury of liver, initial encounter: Secondary | ICD-10-CM | POA: Diagnosis not present

## 2019-01-04 DIAGNOSIS — J9811 Atelectasis: Secondary | ICD-10-CM | POA: Diagnosis present

## 2019-01-04 DIAGNOSIS — J90 Pleural effusion, not elsewhere classified: Secondary | ICD-10-CM | POA: Diagnosis not present

## 2019-01-04 DIAGNOSIS — R740 Nonspecific elevation of levels of transaminase and lactic acid dehydrogenase [LDH]: Secondary | ICD-10-CM | POA: Diagnosis present

## 2019-01-04 DIAGNOSIS — Y95 Nosocomial condition: Secondary | ICD-10-CM | POA: Diagnosis not present

## 2019-01-04 DIAGNOSIS — D1771 Benign lipomatous neoplasm of kidney: Secondary | ICD-10-CM | POA: Diagnosis present

## 2019-01-04 DIAGNOSIS — J942 Hemothorax: Secondary | ICD-10-CM | POA: Diagnosis not present

## 2019-01-04 DIAGNOSIS — D259 Leiomyoma of uterus, unspecified: Secondary | ICD-10-CM | POA: Diagnosis present

## 2019-01-04 DIAGNOSIS — D473 Essential (hemorrhagic) thrombocythemia: Secondary | ICD-10-CM | POA: Diagnosis present

## 2019-01-04 DIAGNOSIS — C787 Secondary malignant neoplasm of liver and intrahepatic bile duct: Secondary | ICD-10-CM | POA: Insufficient documentation

## 2019-01-04 DIAGNOSIS — K661 Hemoperitoneum: Secondary | ICD-10-CM | POA: Diagnosis not present

## 2019-01-04 DIAGNOSIS — E876 Hypokalemia: Secondary | ICD-10-CM | POA: Diagnosis present

## 2019-01-04 DIAGNOSIS — K8020 Calculus of gallbladder without cholecystitis without obstruction: Secondary | ICD-10-CM | POA: Diagnosis not present

## 2019-01-04 DIAGNOSIS — J189 Pneumonia, unspecified organism: Secondary | ICD-10-CM | POA: Diagnosis not present

## 2019-01-04 DIAGNOSIS — R160 Hepatomegaly, not elsewhere classified: Secondary | ICD-10-CM | POA: Diagnosis present

## 2019-01-04 DIAGNOSIS — Z751 Person awaiting admission to adequate facility elsewhere: Secondary | ICD-10-CM | POA: Diagnosis not present

## 2019-01-04 DIAGNOSIS — R402254 Coma scale, best verbal response, oriented, 24 hours or more after hospital admission: Secondary | ICD-10-CM | POA: Diagnosis not present

## 2019-01-04 DIAGNOSIS — C801 Malignant (primary) neoplasm, unspecified: Principal | ICD-10-CM

## 2019-01-04 DIAGNOSIS — R1011 Right upper quadrant pain: Secondary | ICD-10-CM | POA: Diagnosis not present

## 2019-01-04 DIAGNOSIS — Z800 Family history of malignant neoplasm of digestive organs: Secondary | ICD-10-CM | POA: Diagnosis not present

## 2019-01-04 DIAGNOSIS — D134 Benign neoplasm of liver: Secondary | ICD-10-CM | POA: Diagnosis not present

## 2019-01-04 DIAGNOSIS — R918 Other nonspecific abnormal finding of lung field: Secondary | ICD-10-CM | POA: Diagnosis present

## 2019-01-04 DIAGNOSIS — R402144 Coma scale, eyes open, spontaneous, 24 hours or more after hospital admission: Secondary | ICD-10-CM | POA: Diagnosis not present

## 2019-01-04 DIAGNOSIS — Y92239 Unspecified place in hospital as the place of occurrence of the external cause: Secondary | ICD-10-CM | POA: Diagnosis not present

## 2019-01-04 DIAGNOSIS — R1013 Epigastric pain: Secondary | ICD-10-CM | POA: Diagnosis present

## 2019-01-04 DIAGNOSIS — Y848 Other medical procedures as the cause of abnormal reaction of the patient, or of later complication, without mention of misadventure at the time of the procedure: Secondary | ICD-10-CM | POA: Diagnosis present

## 2019-01-04 DIAGNOSIS — R6510 Systemic inflammatory response syndrome (SIRS) of non-infectious origin without acute organ dysfunction: Secondary | ICD-10-CM | POA: Diagnosis not present

## 2019-01-04 LAB — HEPATIC FUNCTION PANEL
ALT: 264 U/L — ABNORMAL HIGH (ref 0–44)
AST: 81 U/L — ABNORMAL HIGH (ref 15–41)
Albumin: 2.6 g/dL — ABNORMAL LOW (ref 3.5–5.0)
Alkaline Phosphatase: 221 U/L — ABNORMAL HIGH (ref 38–126)
Bilirubin, Direct: 0.5 mg/dL — ABNORMAL HIGH (ref 0.0–0.2)
Indirect Bilirubin: 0.7 mg/dL (ref 0.3–0.9)
TOTAL PROTEIN: 6.2 g/dL — AB (ref 6.5–8.1)
Total Bilirubin: 1.2 mg/dL (ref 0.3–1.2)

## 2019-01-04 LAB — TYPE AND SCREEN
ABO/RH(D): O POS
Antibody Screen: NEGATIVE
Unit division: 0
Unit division: 0

## 2019-01-04 LAB — GLUCOSE, CAPILLARY: Glucose-Capillary: 98 mg/dL (ref 70–99)

## 2019-01-04 LAB — URINALYSIS, ROUTINE W REFLEX MICROSCOPIC
Bilirubin Urine: NEGATIVE
GLUCOSE, UA: NEGATIVE mg/dL
Ketones, ur: 5 mg/dL — AB
Leukocytes, UA: NEGATIVE
Nitrite: NEGATIVE
Protein, ur: NEGATIVE mg/dL
Specific Gravity, Urine: 1.005 (ref 1.005–1.030)
pH: 6 (ref 5.0–8.0)

## 2019-01-04 LAB — BPAM RBC
Blood Product Expiration Date: 202001302359
Blood Product Expiration Date: 202001302359
ISSUE DATE / TIME: 202001041108
ISSUE DATE / TIME: 202001041259
Unit Type and Rh: 5100
Unit Type and Rh: 5100

## 2019-01-04 LAB — CBC
HCT: 27.2 % — ABNORMAL LOW (ref 36.0–46.0)
HEMOGLOBIN: 8.4 g/dL — AB (ref 12.0–15.0)
MCH: 29.5 pg (ref 26.0–34.0)
MCHC: 30.9 g/dL (ref 30.0–36.0)
MCV: 95.4 fL (ref 80.0–100.0)
Platelets: 271 10*3/uL (ref 150–400)
RBC: 2.85 MIL/uL — ABNORMAL LOW (ref 3.87–5.11)
RDW: 13.8 % (ref 11.5–15.5)
WBC: 15.8 10*3/uL — ABNORMAL HIGH (ref 4.0–10.5)
nRBC: 0.1 % (ref 0.0–0.2)

## 2019-01-04 LAB — HEMOGLOBIN AND HEMATOCRIT, BLOOD
HCT: 27 % — ABNORMAL LOW (ref 36.0–46.0)
Hemoglobin: 8.4 g/dL — ABNORMAL LOW (ref 12.0–15.0)

## 2019-01-04 LAB — BASIC METABOLIC PANEL
Anion gap: 9 (ref 5–15)
BUN: 6 mg/dL (ref 6–20)
CO2: 25 mmol/L (ref 22–32)
Calcium: 8.2 mg/dL — ABNORMAL LOW (ref 8.9–10.3)
Chloride: 104 mmol/L (ref 98–111)
Creatinine, Ser: 0.57 mg/dL (ref 0.44–1.00)
GFR calc Af Amer: >60 mL/min (ref >60)
GFR calc non Af Amer: >60 mL/min (ref >60)
Glucose, Bld: 109 mg/dL — ABNORMAL HIGH (ref 70–99)
Potassium: 4.1 mmol/L (ref 3.5–5.1)
SODIUM: 138 mmol/L (ref 135–145)

## 2019-01-04 LAB — OCCULT BLOOD X 1 CARD TO LAB, STOOL: FECAL OCCULT BLD: NEGATIVE

## 2019-01-04 MED ORDER — ORAL CARE MOUTH RINSE
15.0000 mL | Freq: Two times a day (BID) | OROMUCOSAL | Status: DC
  Administered 2019-01-05 – 2019-01-08 (×5): 15 mL via OROMUCOSAL

## 2019-01-04 MED ORDER — BISACODYL 10 MG RE SUPP
10.0000 mg | Freq: Once | RECTAL | Status: AC
  Administered 2019-01-04: 10 mg via RECTAL
  Filled 2019-01-04: qty 1

## 2019-01-04 NOTE — Progress Notes (Signed)
Assessment & Plan: HD#5 - RUQ abdominal pain, hepatic masses  Hgb stable without further transfusions, remains in stepdown/ICU  Path pending on IR biopsy performed 1/3  No role for surgical intervention at present  Will follow pending path results        Armandina Gemma, MD       [facility]       Office: [phone]   Chief Complaint: RUQ abdomina pain, hepatic mass  Subjective: Patient up at bedside, family in room.  Pain much improved.  Objective: Vital signs in last 24 hours: Temp:  [98.6 F (37 C)-102.7 F (39.3 C)] 99.1 F (37.3 C) (01/06 0356) Pulse Rate:  [107-122] 117 (01/06 0431) Resp:  [16-32] 21 (01/06 0431) BP: (124-160)/(62-94) 145/69 (01/06 0431) SpO2:  [94 %-99 %] 98 % (01/06 0431) Last BM Date: (01/01/19)  Intake/Output from previous day: 01/05 0701 - 01/06 0700 In: 3042.1 [P.O.:1730; I.V.:967.8; IV Piggyback:344.3] Out: 1275 [Urine:1275] Intake/Output this shift: No intake/output data recorded.  Physical Exam: HEENT - sclerae clear, mucous membranes moist Neck - soft Abdomen - soft, mild diffuse tenderness; BS present Ext - no edema, non-tender Neuro - alert & oriented, no focal deficits  Lab Results:  Recent Labs    01/02/19 0525  01/03/19 1958 01/04/19 0320  WBC 14.9*  --   --  15.8*  HGB 6.9*   < > 8.6* 8.4*  HCT 23.0*   < > 28.1* 27.2*  PLT 350  --   --  271   < > = values in this interval not displayed.   BMET Recent Labs    01/03/19 0622 01/04/19 0320  NA 138 138  K 3.5 4.1  CL 106 104  CO2 22 25  GLUCOSE 101* 109*  BUN 6 6  CREATININE 0.48 0.57  CALCIUM 8.0* 8.2*   PT/INR Recent Labs    01/02/19 1057  LABPROT 15.2  INR 1.21   Comprehensive Metabolic Panel:    Component Value Date/Time   NA 138 01/04/2019 0320   NA 138 01/03/2019 0622   K 4.1 01/04/2019 0320   K 3.5 01/03/2019 0622   CL 104 01/04/2019 0320   CL 106 01/03/2019 0622   CO2 25 01/04/2019 0320   CO2 22 01/03/2019 0622   BUN 6 01/04/2019 0320   BUN 6 01/03/2019 0622   CREATININE 0.57 01/04/2019 0320   CREATININE 0.48 01/03/2019 0622   GLUCOSE 109 (H) 01/04/2019 0320   GLUCOSE 101 (H) 01/03/2019 0622   CALCIUM 8.2 (L) 01/04/2019 0320   CALCIUM 8.0 (L) 01/03/2019 0622   AST 81 (H) 01/04/2019 0320   AST 198 (H) 01/03/2019 0622   ALT 264 (H) 01/04/2019 0320   ALT 383 (H) 01/03/2019 0622   ALKPHOS 221 (H) 01/04/2019 0320   ALKPHOS 223 (H) 01/03/2019 0622   BILITOT 1.2 01/04/2019 0320   BILITOT 1.5 (H) 01/03/2019 0622   PROT 6.2 (L) 01/04/2019 0320   PROT 5.7 (L) 01/03/2019 0622   ALBUMIN 2.6 (L) 01/04/2019 0320   ALBUMIN 2.6 (L) 01/03/2019 0622    Studies/Results: Ct Abdomen Pelvis Wo Contrast  Addendum Date: 01/02/2019   ADDENDUM REPORT: 01/02/2019 10:53 ADDENDUM: These results were called by telephone at the time of interpretation on 01/02/2019 at 10:43 am to Dr. Niel Hummer , who verbally acknowledged these results. Electronically Signed   By: Jacqulynn Cadet M.D.   On: 01/02/2019 10:53   Result Date: 01/02/2019 CLINICAL DATA:  53 year old female with lethargy, decreased  arousal and new onset anemia. Patient underwent liver biopsy yesterday. Evaluate for hemorrhage. EXAM: CT ABDOMEN AND PELVIS WITHOUT CONTRAST TECHNIQUE: Multidetector CT imaging of the abdomen and pelvis was performed following the standard protocol without IV contrast. COMPARISON:  Recent prior CT scan of the abdomen and pelvis 12/31/2018; MRI of the abdomen 01/01/2019 and CT scan of the chest also 01/01/2019 FINDINGS: Lower chest: Increased round atelectasis in the posterior aspect of the right lower lobe persistent left lower lobe atelectasis. Trace right pleural effusion. The intracardiac blood pool is hypodense relative to the adjacent myocardium consistent with anemia. Hepatobiliary: Hepatomegaly with multiple hepatic masses as previously seen. There is new high attenuation within the mass in the posterior aspect of the right hemi  liver as well as several small foci of gas likely representing the site of prior biopsy complicated by some intralesional hemorrhage. Gallstones again noted within the gallbladder. Pancreas: Unremarkable. No pancreatic ductal dilatation or surrounding inflammatory changes. Spleen: Normal in size without focal abnormality. Adrenals/Urinary Tract: Normal adrenal glands. No evidence of hydronephrosis or nephrolithiasis. 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 Stomach/Bowel: No focal bowel wall thickening or evidence of obstruction. Normal appendix in the right lower quadrant. Vascular/Lymphatic: Limited evaluation in the absence of intravenous contrast. No evidence of aneurysm. Reproductive: Dystrophic calcified uterine fibroids. No adnexal masses. Other: Interval development of moderate hemoperitoneum predominantly layering in the anatomic pelvis with a trace amount in the pericolic gutters. Musculoskeletal: No acute fracture or aggressive appearing lytic or blastic osseous lesion. IMPRESSION: 1. Positive for new intralesional hemorrhage and new small-moderate hemoperitoneum layering in the anatomic pelvis. Findings likely represent bleeding from the recent biopsy site. 2. Slightly increased bilateral lower lobe atelectasis. 3. Otherwise, no significant interval change compared to recent prior imaging. Electronically Signed: By: Jacqulynn Cadet M.D. On: 01/02/2019 10:32      Jodi Forbes M 01/04/2019  Patient ID: Jodi Forbes, female   DOB: 12/19/1966, 53 y.o.   MRN: [id number]

## 2019-01-04 NOTE — Progress Notes (Addendum)
Referring Physician(s): Regalado,B  Supervising Physician: Jacqulynn Cadet  Patient Status:  [facility] - In-pt  Chief Complaint:  Abdominal/pelvic pain  Subjective: Pt states that abd/pelvic pain is not quite as bad today; has eaten; passing flatus but no BM yet; denies N/V;has had temp elevation/occ sweats   Allergies: Patient has no known allergies.  Medications: Prior to Admission medications   Not on File     Vital Signs: BP (!) 143/75 (BP Location: Left Arm)   Pulse (!) 127   Temp (!) 102.1 F (38.9 C) (Oral)   Resp (!) 26   Ht 5\' 4"  (1.626 m)   Wt 193 lb 12.6 oz (87.9 kg)   SpO2 95%   BMI 33.26 kg/m   Physical Exam awake/alert; abd soft,+BS, puncture site rt upper abd clean and dry, sl tender RUQ/ant lower pelvic region/flank  Imaging: Ct Abdomen Pelvis Wo Contrast  Addendum Date: 01/02/2019   ADDENDUM REPORT: 01/02/2019 10:53 ADDENDUM: These results were called by telephone at the time of interpretation on 01/02/2019 at 10:43 am to Dr. Niel Hummer , who verbally acknowledged these results. Electronically Signed   By: Jacqulynn Cadet M.D.   On: 01/02/2019 10:53   Result Date: 01/02/2019 CLINICAL DATA:  53 year old female with lethargy, decreased arousal and new onset anemia. Patient underwent liver biopsy yesterday. Evaluate for hemorrhage. EXAM: CT ABDOMEN AND PELVIS WITHOUT CONTRAST TECHNIQUE: Multidetector CT imaging of the abdomen and pelvis was performed following the standard protocol without IV contrast. COMPARISON:  Recent prior CT scan of the abdomen and pelvis 12/31/2018; MRI of the abdomen 01/01/2019 and CT scan of the chest also 01/01/2019 FINDINGS: Lower chest: Increased round atelectasis in the posterior aspect of the right lower lobe persistent left lower lobe atelectasis. Trace right pleural effusion. The intracardiac blood pool is hypodense relative to the adjacent myocardium consistent with anemia. Hepatobiliary: Hepatomegaly with multiple  hepatic masses as previously seen. There is new high attenuation within the mass in the posterior aspect of the right hemi liver as well as several small foci of gas likely representing the site of prior biopsy complicated by some intralesional hemorrhage. Gallstones again noted within the gallbladder. Pancreas: Unremarkable. No pancreatic ductal dilatation or surrounding inflammatory changes. Spleen: Normal in size without focal abnormality. Adrenals/Urinary Tract: Normal adrenal glands. No evidence of hydronephrosis or nephrolithiasis. 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 Stomach/Bowel: No focal bowel wall thickening or evidence of obstruction. Normal appendix in the right lower quadrant. Vascular/Lymphatic: Limited evaluation in the absence of intravenous contrast. No evidence of aneurysm. Reproductive: Dystrophic calcified uterine fibroids. No adnexal masses. Other: Interval development of moderate hemoperitoneum predominantly layering in the anatomic pelvis with a trace amount in the pericolic gutters. Musculoskeletal: No acute fracture or aggressive appearing lytic or blastic osseous lesion. IMPRESSION: 1. Positive for new intralesional hemorrhage and new small-moderate hemoperitoneum layering in the anatomic pelvis. Findings likely represent bleeding from the recent biopsy site. 2. Slightly increased bilateral lower lobe atelectasis. 3. Otherwise, no significant interval change compared to recent prior imaging. Electronically Signed: By: Jacqulynn Cadet M.D. On: 01/02/2019 10:32   Dg Chest 2 View  Result Date: 12/31/2018 CLINICAL DATA:  Chest pain radiating into back today.  Vomiting. EXAM: CHEST - 2 VIEW COMPARISON:  None. FINDINGS: Heart size is normal. Lung volumes are low. Mild bibasilar atelectasis is present. No significant airspace consolidation is present. There is no edema or effusion. IMPRESSION: Low lung volumes and mild  bibasilar atelectasis. No acute cardiopulmonary disease. Electronically Signed   By: San Morelle M.D.   On: 12/31/2018 16:22   Ct Chest W Contrast  Result Date: 01/01/2019 CLINICAL DATA:  Midsternal chest pain. Multiple large liver lesions on previous recent imaging. EXAM: CT CHEST WITH CONTRAST TECHNIQUE: Multidetector CT imaging of the chest was performed during intravenous contrast administration. CONTRAST:  24mL ISOVUE-300 IOPAMIDOL (ISOVUE-300) INJECTION 61% COMPARISON:  None. FINDINGS: Cardiovascular: Heart size upper normal. No pericardial effusion. No thoracic aortic aneurysm. Mediastinum/Nodes: Small mediastinal lymph nodes are evident without mediastinal lymphadenopathy. There is no hilar lymphadenopathy. The esophagus has normal imaging features. There is no axillary lymphadenopathy. Lungs/Pleura: The central tracheobronchial airways are patent. 5 mm right upper lobe ground-glass nodule identified on 48/5. 3 mm left upper lobe nodule (27/5) may be cavitary. Ill-defined 8 mm nodule identified central left upper lobe on 45/5. Dependent atelectasis noted in the lower lobes bilaterally and associated airspace infection could not be excluded. There is no evidence for pleural effusion. Upper Abdomen: Large posterior right hepatic mass noted with other smaller hepatic lesions evident, better characterized on previous imaging. Musculoskeletal: No worrisome lytic or sclerotic osseous abnormality. IMPRESSION: 1. No evidence for primary malignancy in the thorax. 2. Scattered tiny bilateral pulmonary nodules are too small to characterize and nonspecific. Metastatic disease could certainly have this appearance and close attention on follow-up recommended. 3. Known hepatic lesions again noted. Electronically Signed   By: Misty Stanley M.D.   On: 01/01/2019 13:18   Mr Abdomen W Wo Contrast  Result Date: 01/01/2019 CLINICAL DATA:  Followup multiple liver lesions. EXAM: MRI ABDOMEN WITHOUT AND WITH CONTRAST  TECHNIQUE: Multiplanar multisequence MR imaging of the abdomen was performed both before and after the administration of intravenous contrast. CONTRAST:  10 cc Gadavist COMPARISON:  Ultrasound abdomen, 12/31/2018 and CT abdomen 12/31/2018. FINDINGS: Lower chest: Bibasilar atelectasis is noted. No obvious pulmonary lesions. An 8 mm right lower lobe pulmonary nodule was noted on the CT scan. The distal esophagus is grossly normal. Hepatobiliary: As demonstrated on the ultrasound and CT scan there are innumerable large masses throughout both lobes of the liver. 8 cm segment 8 liver lesion appears to have a central scar. Similar appearance of the 9 cm segment 3 lesion which has central necrosis and hemorrhage. 9.5 cm segment 6 lesion demonstrates extensive necrosis with variable areas of hemorrhage. Gallstones are again noted in the gallbladder but no definite findings for acute cholecystitis. Pancreas:  No mass, inflammation or ductal dilatation. Spleen:  Normal size.  No focal lesions. Adrenals/Urinary Tract: The adrenal glands and kidneys are unremarkable. Simple left renal cyst is noted. Stomach/Bowel: No evidence of a gastric mass. The small bowel and colon are grossly normal. No obvious colon cancer is demonstrated on this exam or the CT scan. Vascular/Lymphatic: The aorta and branch vessels are patent. The major venous structures are patent. No mesenteric mass or adenopathy. No retroperitoneal adenopathy. Other:  No ascites or abdominal wall hernia. Musculoskeletal: No significant bony findings. IMPRESSION: 1. Numerous large masses throughout both lobes of the liver, likely metastatic disease but possible multifocal primary hepatic tumors. No obvious primary neoplasm is identified. Specifically, I do not see any evidence of a gastric or pancreatic tumor or colon cancer. No obvious breast masses. 2. Recommend consideration for ultrasound-guided liver biopsy and chest CT to exclude a pulmonary neoplasm.  Electronically Signed   By: Marijo Sanes M.D.   On: 01/01/2019 09:29   Ct Abdomen Pelvis W Contrast  Result Date:  12/31/2018 CLINICAL DATA:  53 y/o F; epigastric pain radiating to the back with nausea and vomiting for 3 days. EXAM: CT ABDOMEN AND PELVIS WITH CONTRAST TECHNIQUE: Multidetector CT imaging of the abdomen and pelvis was performed using the standard protocol following bolus administration of intravenous contrast. CONTRAST:  168mL ISOVUE-300 IOPAMIDOL (ISOVUE-300) INJECTION 61% COMPARISON:  None. FINDINGS: Lower chest: 8 mm right lower lobe pulmonary nodule (series 4, image 1). Hepatobiliary: Cholelithiasis. No gallbladder wall thickening or biliary ductal dilatation. Numerous liver masses occupying approximately 70% of liver parenchyma measuring up to 9.2 x 9.1 cm in the right lobe of liver (series 2, image 24). Several masses are peripheral and exophytic, however, no extrahepatic invasion is identified. Pancreas: Unremarkable. No pancreatic ductal dilatation or surrounding inflammatory changes. Spleen: Normal in size without focal abnormality. Adrenals/Urinary Tract: Normal adrenal glands. 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 No additional focal kidney lesion, urinary stone disease, hydronephrosis. Normal bladder. Stomach/Bowel: Stomach is within normal limits. Appendix appears normal. No evidence of bowel wall thickening, distention, or inflammatory changes. Vascular/Lymphatic: No significant vascular findings are present. No enlarged abdominal or pelvic lymph nodes. Reproductive: Multiple uterine fibroids measuring up to 4.2 cm. No adnexal mass identified. Other: No abdominal wall hernia or abnormality. No abdominopelvic ascites. Musculoskeletal: No acute or significant osseous findings. IMPRESSION: 1. Numerous nonspecific liver masses measuring up to 9.2 cm. No extrahepatic invasion is identified. Some differential considerations include metastasis,  primary hepatocellular or biliary malignancy, hepatic adenomatosis, and lymphoma. MRI of the abdomen with and without contrast may help to characterize and tissue sampling is recommended. 2. 8 mm right lower lobe pulmonary nodule. 3. Cholelithiasis.  No findings of acute cholecystitis. 4. Multiple uterine fibroids measuring up to 4.2 cm. 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 Electronically Signed   By: Kristine Garbe M.D.   On: 12/31/2018 21:42   US Abdomen Limited  Result Date: 12/31/2018 CLINICAL DATA:  Sudden onset epigastric pain with nausea and vomiting. EXAM: ULTRASOUND ABDOMEN LIMITED RIGHT UPPER QUADRANT COMPARISON:  None. FINDINGS: Gallbladder: Small gallstones and sludge are noted. No wall thickening, however there is a focal irregularity of the gallbladder wall adjacent to a large mass in the right hepatic lobe. No sonographic Murphy sign noted by sonographer. Common bile duct: Diameter: 1 mm, normal. Liver: There is a 6.2 x 4.7 x 4.0 cm heterogeneous mass with internal vascularity in the right hepatic lobe adjacent to the gallbladder. There is also a 5.5 x 2.1 x 2.9 cm heterogeneous mass with internal vascularity in the left hepatic lobe. Heterogeneous parenchymal echogenicity. Portal vein is patent on color Doppler imaging with normal direction of blood flow towards the liver. IMPRESSION: 1. Large solid-appearing heterogeneous masses in the left and right hepatic lobes. The right hepatic lobe mass may be invading the gallbladder given adjacent gallbladder wall irregularity. Findings are concerning for malignancy. Recommend CT of the abdomen and pelvis with contrast for further evaluation. 2. Cholelithiasis and sludge without sonographic evidence of acute cholecystitis. Electronically Signed   By: Titus Dubin M.D.   On: 12/31/2018 20:04   Ir US Guide Bx Asp/drain  Result Date: 01/01/2019 INDICATION: No known primary, now with multiple liver lesions and masses worrisome for  metastatic disease. Please from ultrasound-guided liver mass biopsy for tissue diagnostic purposes. EXAM: ULTRASOUND GUIDED LIVER LESION BIOPSY COMPARISON:  CT abdomen and pelvis - 12/31/2018; abdominal MRI - 01/01/2019 MEDICATIONS: None ANESTHESIA/SEDATION: Fentanyl 75 mcg IV; Versed 1.5 mg IV Total Moderate Sedation time:  13 Minutes. The patient's level of consciousness and vital signs were monitored continuously by radiology nursing throughout the procedure under my direct supervision. COMPLICATIONS: None immediate. PROCEDURE: Informed written consent was obtained from the patient after a discussion of the risks, benefits and alternatives to treatment. The patient understands and consents the procedure. A timeout was performed prior to the initiation of the procedure. Ultrasound scanning was performed of the right upper abdominal quadrant demonstrates multiple mixed echogenic liver lesions and masses compatible with findings seen on preceding abdominal CT and MRI. Dominant lesion within the posterior aspect the right lobe of the liver was targeted for biopsy given lesion location and sonographic window. The procedure was planned. The right upper abdominal quadrant was prepped and draped in the usual sterile fashion. The overlying soft tissues were anesthetized with 1% lidocaine with epinephrine. A 17 gauge, 6.8 cm co-axial needle was advanced into a peripheral aspect of the lesion. This was followed by 5 core biopsies with an 18 gauge core device under direct ultrasound guidance. The coaxial needle tract was embolized with a small amount of Gel-Foam slurry and superficial hemostasis was obtained with manual compression. Post procedural scanning was negative for definitive area of hemorrhage or additional complication. A dressing was placed. The patient tolerated the procedure well without immediate post procedural complication. IMPRESSION: Technically successful ultrasound guided core needle biopsy of dominant  mass within the posterior segment of the right lobe of the liver. Electronically Signed   By: Sandi Mariscal M.D.   On: 01/01/2019 17:34    Labs:  CBC: Recent Labs    12/31/18 1626 01/01/19 0528 01/02/19 0525  01/03/19 0622 01/03/19 1136 01/03/19 1958 01/04/19 0320  WBC 13.3* 13.4* 14.9*  --   --   --   --  15.8*  HGB 11.1* 8.4* 6.9*   < > 8.7* 8.8* 8.6* 8.4*  HCT 35.4* 27.7* 23.0*   < > 28.2* 28.1* 28.1* 27.2*  PLT 419* 400 350  --   --   --   --  271   < > = values in this interval not displayed.    COAGS: Recent Labs    01/01/19 0528 01/02/19 1057  INR 1.20 1.21    BMP: Recent Labs    01/01/19 0528 01/02/19 0525 01/03/19 0622 01/04/19 0320  NA 139 138 138 138  K 4.0 3.4* 3.5 4.1  CL 108 106 106 104  CO2 21* 23 22 25   GLUCOSE 114* 124* 101* 109*  BUN 26* 14 6 6   CALCIUM 8.4* 7.9* 8.0* 8.2*  CREATININE 0.76 0.61 0.48 0.57  GFRNONAA >60 >60 >60 >60  GFRAA >60 >60 >60 >60    LIVER FUNCTION TESTS: Recent Labs    01/01/19 0528 01/02/19 0525 01/03/19 0622 01/04/19 0320  BILITOT 0.8 0.9 1.5* 1.2  AST 414* 662* 198* 81*  ALT 393* 604* 383* 264*  ALKPHOS 209* 233* 223* 221*  PROT 6.2* 5.8* 5.7* 6.2*  ALBUMIN 2.9* 2.7* 2.6* 2.6*    Assessment and Plan: Pt s/p right liver lesion bx 1/3 with subsequent intralesional hemorrhage and small to moderate hemoperitoneum; path pending; temp 102.1, remains tachycardic, WBC 15.8, up slightly from 14.9, creatinine normal, hemoglobin 8.4(8.6); LFT's sl improved; cx neg; repeat UA pend;cont conservative measures/serial hgb checks   Electronically Signed: D. Rowe Robert, PA-C 01/04/2019, 9:36 AM   I spent a total of 15 minutes at the the patient's bedside AND on the patient's hospital floor or unit, greater than 50% of which was counseling/coordinating  care for hemorrhage post liver biopsy    Patient ID: Jodi Forbes, female   DOB: [birth year], 53 y.o.   MRN: [id number]

## 2019-01-04 NOTE — Consult Note (Addendum)
Radiation Oncology         [phone] ________________________________  Name: Jodi Forbes        MRN: [id number]  Date of Service: 01/04/2019 DOB: 09-14-1966  [id number], No Pcp Per  No ref. provider found     REFERRING PHYSICIAN: No ref. provider found   DIAGNOSIS: Diagnoses of Abdominal pain and Pain of upper abdomen were pertinent to this visit.   HISTORY OF PRESENT ILLNESS: Jodi Forbes is a 53 y.o. female seen at the request of Dr. Maylon Peppers for what appears to be malignant tumor in the liver. The patient does not have a known history of medical issues other than psoriasis which was diagnosed in 2013, but she is currently without insurance. She states her last mammogram was more than 5 years ago. She reports that all of a sudden on Thursday she developed terrible abdominal pain. She reports she had nausea, vomiting, and mild itching of her skin that she attributes to psoriasis. She was found on abdominal ultrasound to have a 6.2 x 4.7 x 4 cm mass within the right hepatic lobe adjacent to the gallbladder and a 5.5 x 2.1 x 2.9 cm mass in the left hepatic lobe.  Small gallstones and sludge were noted in the gallbladder without any wall thickening though there was focal irregularity of the gallbladder wall adjacent to the large mass.  Her common bile duct appeared normal.  She underwent  CT imaging also on 12/31/2018 that revealed numerous liver masses occupying approximately 70% of the liver parenchyma measuring up to 9.2 x 9.1 cm in the right lobe of the liver, several appeared peripheral and exophytic though no extrahepatic invasion was identified.  An 8 mm right lower lobe nodule was noted, and she had multiple uterine fibroids measuring up to 4.2 cm.  No other significant findings were noted, an MRI of the abdomen with and without contrast on 01/01/2019 revealed bibasilar atelectasis, no obvious pulmonary lesions, normal-appearing distal esophagus, innumerable large masses throughout both  lobes of the liver confirmed from prior imaging, and no adenopathy was identified.  A CT chest with contrast on 01/01/2019 revealed several small bilateral nodules that were too small to characterize, and no evidence of primary malignancy in the thorax was otherwise noted.  She underwent a biopsy of 1 of the liver lesions that day as well and pathology is pending at the time of dictation.  Of note since undergoing her biopsy, she did have a small episode of bleeding from the procedure, this did require administering blood products, and her hemoglobin has since stabilized.  Regarding her blood work as well, her AFP and CEA were normal. Her hepatic function continues to show evidence of transaminitis that is unchanged. We were contacted to evaluate patient to determine if there would be a role for palliative radiotherapy.   PREVIOUS RADIATION THERAPY: No   PAST MEDICAL HISTORY:  Past Medical History:  Diagnosis Date  . Medical history non-contributory   . Psoriasis 2013   bx'd by dermatology per report       PAST SURGICAL HISTORY: Past Surgical History:  Procedure Laterality Date  . IR US GUIDE BX ASP/DRAIN  01/01/2019     FAMILY HISTORY:  Family History  Problem Relation Age of Onset  . Diabetes Mother   . CAD Father   . Breast cancer Maternal Grandmother   . Colon cancer Maternal Grandfather   . Pancreatic cancer Other        Maternal [employer]  SOCIAL HISTORY:  reports that she has never smoked. She has never used smokeless tobacco. She reports that she does not drink alcohol or use drugs.  The patient is single.  She has 2 adult children, and watches her grandchildren during the day.  She is accompanied by her mother.  She reports it has been over 5 years since her last mammogram.  She reports being told that she has not completely gone through menopause but has not had a menstrual period since she was 16.  She lives in [place].   ALLERGIES: Patient has no known  allergies.   MEDICATIONS:  Current Facility-Administered Medications  Medication Dose Route Frequency Provider Last Rate Last Dose  . 0.9 %  sodium chloride infusion (Manually program via Guardrails IV Fluids)   Intravenous Once Regalado, Belkys A, MD      . 0.9 %  sodium chloride infusion   Intravenous Continuous Regalado, Belkys A, MD   Stopped at 01/03/19 1721  . acetaminophen (TYLENOL) tablet 650 mg  650 mg Oral Q6H PRN Regalado, Belkys A, MD   650 mg at 01/04/19 0801   Or  . acetaminophen (TYLENOL) suppository 650 mg  650 mg Rectal Q6H PRN Regalado, Belkys A, MD      . Ampicillin-Sulbactam (UNASYN) 3 g in sodium chloride 0.9 % 100 mL IVPB  3 g Intravenous Q6H Regalado, Belkys A, MD   Stopped at 01/04/19 1136  . fentaNYL (SUBLIMAZE) injection 25-50 mcg  25-50 mcg Intravenous Q2H PRN Regalado, Belkys A, MD   50 mcg at 01/04/19 0801  . hydrALAZINE (APRESOLINE) injection 10 mg  10 mg Intravenous Q6H PRN Regalado, Belkys A, MD   10 mg at 01/03/19 1529  . ondansetron (ZOFRAN) tablet 4 mg  4 mg Oral Q6H PRN Regalado, Belkys A, MD       Or  . ondansetron (ZOFRAN) injection 4 mg  4 mg Intravenous Q6H PRN Regalado, Belkys A, MD      . oxyCODONE (Oxy IR/ROXICODONE) immediate release tablet 5-10 mg  5-10 mg Oral Q6H PRN Regalado, Belkys A, MD   10 mg at 01/03/19 2258  . pantoprazole (PROTONIX) injection 40 mg  40 mg Intravenous Q12H Regalado, Belkys A, MD   40 mg at 01/04/19 0957  . polyethylene glycol (MIRALAX / GLYCOLAX) packet 17 g  17 g Oral BID Regalado, Belkys A, MD   17 g at 01/04/19 0957  . senna-docusate (Senokot-S) tablet 1 tablet  1 tablet Oral BID Regalado, Belkys A, MD   1 tablet at 01/04/19 0957     REVIEW OF SYSTEMS: On review of systems, the patient reports that she is doing well overall. She is still uncomfortable per report but denies frank abdominal pain. She denies any chest pain, shortness of breath, cough, fevers, chills, night sweats, unintended weight changes. She denies any  bowel or bladder disturbances, and denies  Vomiting since coming into the hospital. She denies any new musculoskeletal or joint aches or pains. She denies any known breast masses. A complete review of systems is obtained and is otherwise negative.     PHYSICAL EXAM:  Wt Readings from Last 3 Encounters:  01/02/19 193 lb 12.6 oz (87.9 kg)   Temp Readings from Last 3 Encounters:  01/04/19 98.9 F (37.2 C) (Oral)   BP Readings from Last 3 Encounters:  01/04/19 140/78   Pulse Readings from Last 3 Encounters:  01/04/19 (!) 116   Pain Assessment Pain Score: 4 /10  In general this is  a well appearing African-American female in no acute distress.  She is alert and oriented x4 and appropriate throughout the examination. HEENT reveals that the patient is normocephalic, atraumatic. EOMs are intact.  Skin is intact and she has some scars along bilateral breasts skin consistent with psoriasis, this is also noted along the abdominal wall.  There are mixed areas consistent with comedone changes as well.  Cardiovascular exam reveals a regular rate and rhythm, no clicks rubs or murmurs are auscultated. Chest is clear to auscultation bilaterally. Lymphatic assessment is performed and does not reveal any adenopathy in the cervical, supraclavicular, or axillary chains. No palpable masses are noted of either breast. no nipple discharge is noted of either breast. No skin thickening or retraction is noted. Abdomen has active bowel sounds in all quadrants and is intact. The abdomen is soft, mildytender,mildy distended. Lower extremities are negative for pretibial pitting edema, deep calf tenderness, cyanosis or clubbing.   ECOG = 2  0 - Asymptomatic (Fully active, able to carry on all predisease activities without restriction)  1 - Symptomatic but completely ambulatory (Restricted in physically strenuous activity but ambulatory and able to carry out work of a light or sedentary nature. For example, light  housework, office work)  2 - Symptomatic, <50% in bed during the day (Ambulatory and capable of all self care but unable to carry out any work activities. Up and about more than 50% of waking hours)  3 - Symptomatic, >50% in bed, but not bedbound (Capable of only limited self-care, confined to bed or chair 50% or more of waking hours)  4 - Bedbound (Completely disabled. Cannot carry on any self-care. Totally confined to bed or chair)  5 - Death   Eustace Pen MM, Creech RH, Tormey DC, et al. [phone]). "Toxicity and response criteria of the [facility]". [place] Oncol. 5 (6): 649-55    LABORATORY DATA:  Lab Results  Component Value Date   WBC 15.8 (H) 01/04/2019   HGB 8.4 (L) 01/04/2019   HCT 27.2 (L) 01/04/2019   MCV 95.4 01/04/2019   PLT 271 01/04/2019   Lab Results  Component Value Date   NA 138 01/04/2019   K 4.1 01/04/2019   CL 104 01/04/2019   CO2 25 01/04/2019   Lab Results  Component Value Date   ALT 264 (H) 01/04/2019   AST 81 (H) 01/04/2019   ALKPHOS 221 (H) 01/04/2019   BILITOT 1.2 01/04/2019      RADIOGRAPHY: Ct Abdomen Pelvis Wo Contrast  Addendum Date: 01/02/2019   ADDENDUM REPORT: 01/02/2019 10:53 ADDENDUM: These results were called by telephone at the time of interpretation on 01/02/2019 at 10:43 am to Dr. Niel Hummer , who verbally acknowledged these results. Electronically Signed   By: Jacqulynn Cadet M.D.   On: 01/02/2019 10:53   Result Date: 01/02/2019 CLINICAL DATA:  53 year old female with lethargy, decreased arousal and new onset anemia. Patient underwent liver biopsy yesterday. Evaluate for hemorrhage. EXAM: CT ABDOMEN AND PELVIS WITHOUT CONTRAST TECHNIQUE: Multidetector CT imaging of the abdomen and pelvis was performed following the standard protocol without IV contrast. COMPARISON:  Recent prior CT scan of the abdomen and pelvis 12/31/2018; MRI of the abdomen 01/01/2019 and CT scan of the chest also 01/01/2019 FINDINGS: Lower  chest: Increased round atelectasis in the posterior aspect of the right lower lobe persistent left lower lobe atelectasis. Trace right pleural effusion. The intracardiac blood pool is hypodense relative to the adjacent myocardium consistent with anemia. Hepatobiliary: Hepatomegaly  with multiple hepatic masses as previously seen. There is new high attenuation within the mass in the posterior aspect of the right hemi liver as well as several small foci of gas likely representing the site of prior biopsy complicated by some intralesional hemorrhage. Gallstones again noted within the gallbladder. Pancreas: Unremarkable. No pancreatic ductal dilatation or surrounding inflammatory changes. Spleen: Normal in size without focal abnormality. Adrenals/Urinary Tract: Normal adrenal glands. No evidence of hydronephrosis or nephrolithiasis. 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 Stomach/Bowel: No focal bowel wall thickening or evidence of obstruction. Normal appendix in the right lower quadrant. Vascular/Lymphatic: Limited evaluation in the absence of intravenous contrast. No evidence of aneurysm. Reproductive: Dystrophic calcified uterine fibroids. No adnexal masses. Other: Interval development of moderate hemoperitoneum predominantly layering in the anatomic pelvis with a trace amount in the pericolic gutters. Musculoskeletal: No acute fracture or aggressive appearing lytic or blastic osseous lesion. IMPRESSION: 1. Positive for new intralesional hemorrhage and new small-moderate hemoperitoneum layering in the anatomic pelvis. Findings likely represent bleeding from the recent biopsy site. 2. Slightly increased bilateral lower lobe atelectasis. 3. Otherwise, no significant interval change compared to recent prior imaging. Electronically Signed: By: Jacqulynn Cadet M.D. On: 01/02/2019 10:32   Dg Chest 2 View  Result Date: 12/31/2018 CLINICAL DATA:  Chest  pain radiating into back today.  Vomiting. EXAM: CHEST - 2 VIEW COMPARISON:  None. FINDINGS: Heart size is normal. Lung volumes are low. Mild bibasilar atelectasis is present. No significant airspace consolidation is present. There is no edema or effusion. IMPRESSION: Low lung volumes and mild bibasilar atelectasis. No acute cardiopulmonary disease. Electronically Signed   By: San Morelle M.D.   On: 12/31/2018 16:22   Ct Chest W Contrast  Result Date: 01/01/2019 CLINICAL DATA:  Midsternal chest pain. Multiple large liver lesions on previous recent imaging. EXAM: CT CHEST WITH CONTRAST TECHNIQUE: Multidetector CT imaging of the chest was performed during intravenous contrast administration. CONTRAST:  16mL ISOVUE-300 IOPAMIDOL (ISOVUE-300) INJECTION 61% COMPARISON:  None. FINDINGS: Cardiovascular: Heart size upper normal. No pericardial effusion. No thoracic aortic aneurysm. Mediastinum/Nodes: Small mediastinal lymph nodes are evident without mediastinal lymphadenopathy. There is no hilar lymphadenopathy. The esophagus has normal imaging features. There is no axillary lymphadenopathy. Lungs/Pleura: The central tracheobronchial airways are patent. 5 mm right upper lobe ground-glass nodule identified on 48/5. 3 mm left upper lobe nodule (27/5) may be cavitary. Ill-defined 8 mm nodule identified central left upper lobe on 45/5. Dependent atelectasis noted in the lower lobes bilaterally and associated airspace infection could not be excluded. There is no evidence for pleural effusion. Upper Abdomen: Large posterior right hepatic mass noted with other smaller hepatic lesions evident, better characterized on previous imaging. Musculoskeletal: No worrisome lytic or sclerotic osseous abnormality. IMPRESSION: 1. No evidence for primary malignancy in the thorax. 2. Scattered tiny bilateral pulmonary nodules are too small to characterize and nonspecific. Metastatic disease could certainly have this appearance and  close attention on follow-up recommended. 3. Known hepatic lesions again noted. Electronically Signed   By: Misty Stanley M.D.   On: 01/01/2019 13:18   Mr Abdomen W Wo Contrast  Result Date: 01/01/2019 CLINICAL DATA:  Followup multiple liver lesions. EXAM: MRI ABDOMEN WITHOUT AND WITH CONTRAST TECHNIQUE: Multiplanar multisequence MR imaging of the abdomen was performed both before and after the administration of intravenous contrast. CONTRAST:  10 cc Gadavist COMPARISON:  Ultrasound abdomen, 12/31/2018 and CT abdomen 12/31/2018. FINDINGS: Lower chest: Bibasilar atelectasis is  noted. No obvious pulmonary lesions. An 8 mm right lower lobe pulmonary nodule was noted on the CT scan. The distal esophagus is grossly normal. Hepatobiliary: As demonstrated on the ultrasound and CT scan there are innumerable large masses throughout both lobes of the liver. 8 cm segment 8 liver lesion appears to have a central scar. Similar appearance of the 9 cm segment 3 lesion which has central necrosis and hemorrhage. 9.5 cm segment 6 lesion demonstrates extensive necrosis with variable areas of hemorrhage. Gallstones are again noted in the gallbladder but no definite findings for acute cholecystitis. Pancreas:  No mass, inflammation or ductal dilatation. Spleen:  Normal size.  No focal lesions. Adrenals/Urinary Tract: The adrenal glands and kidneys are unremarkable. Simple left renal cyst is noted. Stomach/Bowel: No evidence of a gastric mass. The small bowel and colon are grossly normal. No obvious colon cancer is demonstrated on this exam or the CT scan. Vascular/Lymphatic: The aorta and branch vessels are patent. The major venous structures are patent. No mesenteric mass or adenopathy. No retroperitoneal adenopathy. Other:  No ascites or abdominal wall hernia. Musculoskeletal: No significant bony findings. IMPRESSION: 1. Numerous large masses throughout both lobes of the liver, likely metastatic disease but possible multifocal  primary hepatic tumors. No obvious primary neoplasm is identified. Specifically, I do not see any evidence of a gastric or pancreatic tumor or colon cancer. No obvious breast masses. 2. Recommend consideration for ultrasound-guided liver biopsy and chest CT to exclude a pulmonary neoplasm. Electronically Signed   By: Marijo Sanes M.D.   On: 01/01/2019 09:29   Ct Abdomen Pelvis W Contrast  Result Date: 12/31/2018 CLINICAL DATA:  53 y/o F; epigastric pain radiating to the back with nausea and vomiting for 3 days. EXAM: CT ABDOMEN AND PELVIS WITH CONTRAST TECHNIQUE: Multidetector CT imaging of the abdomen and pelvis was performed using the standard protocol following bolus administration of intravenous contrast. CONTRAST:  159mL ISOVUE-300 IOPAMIDOL (ISOVUE-300) INJECTION 61% COMPARISON:  None. FINDINGS: Lower chest: 8 mm right lower lobe pulmonary nodule (series 4, image 1). Hepatobiliary: Cholelithiasis. No gallbladder wall thickening or biliary ductal dilatation. Numerous liver masses occupying approximately 70% of liver parenchyma measuring up to 9.2 x 9.1 cm in the right lobe of liver (series 2, image 24). Several masses are peripheral and exophytic, however, no extrahepatic invasion is identified. Pancreas: Unremarkable. No pancreatic ductal dilatation or surrounding inflammatory changes. Spleen: Normal in size without focal abnormality. Adrenals/Urinary Tract: Normal adrenal glands. 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 No additional focal kidney lesion, urinary stone disease, hydronephrosis. Normal bladder. Stomach/Bowel: Stomach is within normal limits. Appendix appears normal. No evidence of bowel wall thickening, distention, or inflammatory changes. Vascular/Lymphatic: No significant vascular findings are present. No enlarged abdominal or pelvic lymph nodes. Reproductive: Multiple uterine fibroids measuring up to 4.2 cm. No adnexal mass identified. Other: No  abdominal wall hernia or abnormality. No abdominopelvic ascites. Musculoskeletal: No acute or significant osseous findings. IMPRESSION: 1. Numerous nonspecific liver masses measuring up to 9.2 cm. No extrahepatic invasion is identified. Some differential considerations include metastasis, primary hepatocellular or biliary malignancy, hepatic adenomatosis, and lymphoma. MRI of the abdomen with and without contrast may help to characterize and tissue sampling is recommended. 2. 8 mm right lower lobe pulmonary nodule. 3. Cholelithiasis.  No findings of acute cholecystitis. 4. Multiple uterine fibroids measuring up to 4.2 cm. 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 Electronically Signed   By: Kristine Garbe M.D.   On: 12/31/2018  21:42   US Abdomen Limited  Result Date: 12/31/2018 CLINICAL DATA:  Sudden onset epigastric pain with nausea and vomiting. EXAM: ULTRASOUND ABDOMEN LIMITED RIGHT UPPER QUADRANT COMPARISON:  None. FINDINGS: Gallbladder: Small gallstones and sludge are noted. No wall thickening, however there is a focal irregularity of the gallbladder wall adjacent to a large mass in the right hepatic lobe. No sonographic Murphy sign noted by sonographer. Common bile duct: Diameter: 1 mm, normal. Liver: There is a 6.2 x 4.7 x 4.0 cm heterogeneous mass with internal vascularity in the right hepatic lobe adjacent to the gallbladder. There is also a 5.5 x 2.1 x 2.9 cm heterogeneous mass with internal vascularity in the left hepatic lobe. Heterogeneous parenchymal echogenicity. Portal vein is patent on color Doppler imaging with normal direction of blood flow towards the liver. IMPRESSION: 1. Large solid-appearing heterogeneous masses in the left and right hepatic lobes. The right hepatic lobe mass may be invading the gallbladder given adjacent gallbladder wall irregularity. Findings are concerning for malignancy. Recommend CT of the abdomen and pelvis with contrast for further evaluation. 2.  Cholelithiasis and sludge without sonographic evidence of acute cholecystitis. Electronically Signed   By: Titus Dubin M.D.   On: 12/31/2018 20:04   Ir US Guide Bx Asp/drain  Result Date: 01/01/2019 INDICATION: No known primary, now with multiple liver lesions and masses worrisome for metastatic disease. Please from ultrasound-guided liver mass biopsy for tissue diagnostic purposes. EXAM: ULTRASOUND GUIDED LIVER LESION BIOPSY COMPARISON:  CT abdomen and pelvis - 12/31/2018; abdominal MRI - 01/01/2019 MEDICATIONS: None ANESTHESIA/SEDATION: Fentanyl 75 mcg IV; Versed 1.5 mg IV Total Moderate Sedation time:  13 Minutes. The patient's level of consciousness and vital signs were monitored continuously by radiology nursing throughout the procedure under my direct supervision. COMPLICATIONS: None immediate. PROCEDURE: Informed written consent was obtained from the patient after a discussion of the risks, benefits and alternatives to treatment. The patient understands and consents the procedure. A timeout was performed prior to the initiation of the procedure. Ultrasound scanning was performed of the right upper abdominal quadrant demonstrates multiple mixed echogenic liver lesions and masses compatible with findings seen on preceding abdominal CT and MRI. Dominant lesion within the posterior aspect the right lobe of the liver was targeted for biopsy given lesion location and sonographic window. The procedure was planned. The right upper abdominal quadrant was prepped and draped in the usual sterile fashion. The overlying soft tissues were anesthetized with 1% lidocaine with epinephrine. A 17 gauge, 6.8 cm co-axial needle was advanced into a peripheral aspect of the lesion. This was followed by 5 core biopsies with an 18 gauge core device under direct ultrasound guidance. The coaxial needle tract was embolized with a small amount of Gel-Foam slurry and superficial hemostasis was obtained with manual compression. Post  procedural scanning was negative for definitive area of hemorrhage or additional complication. A dressing was placed. The patient tolerated the procedure well without immediate post procedural complication. IMPRESSION: Technically successful ultrasound guided core needle biopsy of dominant mass within the posterior segment of the right lobe of the liver. Electronically Signed   By: Sandi Mariscal M.D.   On: 01/01/2019 17:34       IMPRESSION/PLAN: 1. Malignant appearing tumors within the liver uncertain for primary liver disease versus metastatic disease.  I spent time with the patient and her mother today discussing the findings from her imaging, and that I have discussed her case as well as pathology.  They are still working on immunohistochemistry stains  to determine the source of the finding but are pretty confident at this time that this represents a malignant process.  Hopefully we will have more definitive information to share with the patient tomorrow.  We discussed that given her symptoms and findings on imaging, that she may benefit from radiotherapy to alleviate her symptoms of abdominal pain.  We also discussed that the use of radiotherapy could improve the risks of her having a biliary obstruction from her tumor as well.  We discussed the delivery and logistics as well as the risks, benefits, short and long-term effects of therapy, and at the end of our discussion she is interested in proceeding.Written consent is obtained and placed in the chart, a copy was provided to the patient.  She will come to our department tomorrow to perform simulation in lieu of getting ready for radiation to begin on Wednesday, 01/06/2019.  She is aware that the final results of her pathology will indicate a role for additional therapy such as chemotherapy under the care of Dr. Maylon Peppers.  In a visit lasting 70 minutes, greater than 50% of the time was spent face to face discussing her case, and in floor time,  coordinating the  patient's care.     Carola Rhine, PAC

## 2019-01-04 NOTE — Progress Notes (Signed)
Pharmacy Antibiotic Note  Jodi Forbes is a 53 y.o. female admitted on 12/31/2018 with abdominal pain, liver masses seen on MRI.  R/o primary liver cancer vs metastasis vs infection.  Pharmacy has been consulted for Unasyn dosing.  Tmax 102.1, WBC 15.8, SCr wnl  Plan: Continue Unasyn 3g IV q6h. Follow up renal fxn, culture results, and clinical course.   Height: 5\' 4"  (162.6 cm) Weight: 193 lb 12.6 oz (87.9 kg) IBW/kg (Calculated) : 54.7  Temp (24hrs), Avg:100.1 F (37.8 C), Min:98.6 F (37 C), Max:102.7 F (39.3 C)  Recent Labs  Lab 12/31/18 1626 01/01/19 0528 01/02/19 0525 01/03/19 0622 01/04/19 0320  WBC 13.3* 13.4* 14.9*  --  15.8*  CREATININE 0.63 0.76 0.61 0.48 0.57    Estimated Creatinine Clearance: 89.3 mL/min (by C-G formula based on SCr of 0.57 mg/dL).    No Known Allergies  Antimicrobials this admission: 1/3 Unasyn >>   Dose adjustments this admission:   Microbiology results: 1/3 BCx: ngtd 1/3 UCx: < 10K growth-final 1/3 Liver bx:  1/3 HIV: non-reactive  1/3 Hep panel: neg 1/4 MRSA PCR: neg 1/4 Flu PCR: neg/neg 1/6 UCx:   Thank you for allowing pharmacy to be a part of this patient's care.  Gretta Arab PharmD, BCPS Pager [phone] 01/04/2019 11:05 AM

## 2019-01-04 NOTE — Progress Notes (Signed)
PROGRESS NOTE    Jodi Forbes  KZL:[id number] DOB: [birth year] DOA: 12/31/2018 PCP: Patient, No Pcp Per    Brief Narrative:  53 year old with no significant past medical history who presented to the emergency department complaining of abdominal pain that started the night prior to admission she also report nausea vomiting restarted the day prior to admission. Evaluation in the ED showed creased transaminases alkaline phosphatase 265, leukocytosis at 13,.  Right upper quadrant ultrasound was concerning for large solid-appearing masses with concern for possible invasion into the gallbladder. CT abdomen show numerous nonspecific liver masses without extrahepatic invasion possible metastatic versus primary hepatocellular or biliary malignancy.   Assessment & Plan:   Principal Problem:   Liver masses Active Problems:   Leukocytosis   Thrombocytosis (HCC)   Normocytic anemia   Hemoperitoneum   Abdominal pain   Goals of care, counseling/discussion   Elevated AST (SGOT)   Acute blood loss anemia   Elevated ALT measurement   Hyperbilirubinemia  1-Liver masses; metastatic cancer unknown primary;  blood cultures no growth less than 24 hours.  Patient underwent liver biopsy 01-01-2019 Alpha-fetoprotein level and CEA level negative HIV negative. Hepatitis panel negative.  CT chest multiples small pulmonary nodule.  Liver biopsy pending.  Oncology consulted. He will consult radiation oncology.  Continue with fentanyl and oxycodone for pain management. . Offer palliative care consult for pain management , patient decline.   Leukocytosis thrombocytosis, fever Could be reactive. blood cultures; no growth to date.  On IV Unasyn.  Follow trend Repeat UA, urine culture.   Anemia;acute blood loss anemia; intralesional hemorrhage and retroperitoneum.  Also Iron deficiency anemia.  Hb this am drop to 6.9. two units of PRBC ordered. Hb increase to 8  CT abdomen ordered; showed new  intralesional hemorrhage and new small moderate retroperitoneum in the pelvis.  IR following, recommend support care.  GI consulted, sign off.  I informed, Dr Ninfa Linden CT abdomen results. He recommend Blood transfusion and monitor hb.  Hb has been stable at 8. Bleeding appears to have stopped.  Repeat labs tonight and am.   Fever;  blood culture; no growth.  Might be reactive.  On IV antibiotics.  Influenza negative.  Repeat UA.   Atelectasis;  Incentive spirometry.   Hypokalemia; replete orally  Transaminases; from liver mass. Stable. Trending down.   Estimated body mass index is 33.26 kg/m as calculated from the following:   Height as of this encounter: 5\' 4"  (1.626 m).   Weight as of this encounter: 87.9 kg.   DVT prophylaxis: SCDs Code Status: Full code Family Communication: Mother at bedside.  Disposition Plan: Remain in the hospital for IV antibiotics and further evaluation of multiple liver mass, pain management.  Consultants:   Surgery  I R   Procedures:  Liver biopsy Antimicrobials:   Unasyn  Subjective: She report abdominal pain 4/10, feels some improvement. No BM, feels will have one.    Objective: Vitals:   01/03/19 2345 01/04/19 0200 01/04/19 0356 01/04/19 0431  BP:  (!) 148/86  (!) 145/69  Pulse:  (!) 107  (!) 117  Resp:  16  (!) 21  Temp: 98.7 F (37.1 C)  99.1 F (37.3 C)   TempSrc: Oral  Oral   SpO2:  98%  98%  Weight:      Height:        Intake/Output Summary (Last 24 hours) at 01/04/2019 0740 Last data filed at 01/04/2019 0643 Gross per 24 hour  Intake 3042.09 ml  Output 1275  ml  Net 1767.09 ml   Filed Weights   12/31/18 1555 01/02/19 1030  Weight: 84.8 kg 87.9 kg    Examination:  General exam: NAD Respiratory system: CTA Cardiovascular system: S 1, S 2 RRR Gastrointestinal system: BS present, soft, tenderness, distended Central nervous system: alert, non focal.  Extremities: Symmetric power.  Skin: No rashes/   Data  Reviewed: I have personally reviewed following labs and imaging studies  CBC: Recent Labs  Lab 12/31/18 1626 01/01/19 0528 01/02/19 0525  01/02/19 2351 01/03/19 0622 01/03/19 1136 01/03/19 1958 01/04/19 0320  WBC 13.3* 13.4* 14.9*  --   --   --   --   --  15.8*  NEUTROABS  --  11.3*  --   --   --   --   --   --   --   HGB 11.1* 8.4* 6.9*   < > 8.9* 8.7* 8.8* 8.6* 8.4*  HCT 35.4* 27.7* 23.0*   < > 27.8* 28.2* 28.1* 28.1* 27.2*  MCV 90.8 92.3 93.9  --   --   --   --   --  95.4  PLT 419* 400 350  --   --   --   --   --  271   < > = values in this interval not displayed.   Basic Metabolic Panel: Recent Labs  Lab 12/31/18 1626 01/01/19 0528 01/02/19 0525 01/03/19 0622 01/04/19 0320  NA 137 139 138 138 138  K 3.5 4.0 3.4* 3.5 4.1  CL 104 108 106 106 104  CO2 25 21* 23 22 25   GLUCOSE 111* 114* 124* 101* 109*  BUN 26* 26* 14 6 6   CREATININE 0.63 0.76 0.61 0.48 0.57  CALCIUM 9.2 8.4* 7.9* 8.0* 8.2*   GFR: Estimated Creatinine Clearance: 89.3 mL/min (by C-G formula based on SCr of 0.57 mg/dL). Liver Function Tests: Recent Labs  Lab 12/31/18 2006 01/01/19 0528 01/02/19 0525 01/03/19 0622 01/04/19 0320  AST 95* 414* 662* 198* 81*  ALT 98* 393* 604* 383* 264*  ALKPHOS 265* 209* 233* 223* 221*  BILITOT 0.6 0.8 0.9 1.5* 1.2  PROT 7.0 6.2* 5.8* 5.7* 6.2*  ALBUMIN 3.3* 2.9* 2.7* 2.6* 2.6*   Recent Labs  Lab 12/31/18 1626  LIPASE 25   No results for input(s): AMMONIA in the last 168 hours. Coagulation Profile: Recent Labs  Lab 01/01/19 0528 01/02/19 1057  INR 1.20 1.21   Cardiac Enzymes: Recent Labs  Lab 12/31/18 1626 12/31/18 2006  TROPONINI <0.03 <0.03   BNP (last 3 results) No results for input(s): PROBNP in the last 8760 hours. HbA1C: No results for input(s): HGBA1C in the last 72 hours. CBG: Recent Labs  Lab 01/01/19 0752 01/02/19 0731 01/03/19 0833  GLUCAP 101* 112* 107*   Lipid Profile: No results for input(s): CHOL, HDL, LDLCALC, TRIG,  CHOLHDL, LDLDIRECT in the last 72 hours. Thyroid Function Tests: No results for input(s): TSH, T4TOTAL, FREET4, T3FREE, THYROIDAB in the last 72 hours. Anemia Panel: Recent Labs    01/02/19 0525  VITAMINB12 1,467*  FOLATE 12.2  FERRITIN 776*  TIBC 533*  IRON 19*  RETICCTPCT 2.1   Sepsis Labs: No results for input(s): PROCALCITON, LATICACIDVEN in the last 168 hours.  Recent Results (from the past 240 hour(s))  Culture, blood (routine x 2)     Status: None (Preliminary result)   Collection Time: 01/01/19 11:09 AM  Result Value Ref Range Status   Specimen Description BLOOD RIGHT HAND  Final   Special Requests  Final    BOTTLES DRAWN AEROBIC AND ANAEROBIC Blood Culture adequate volume Performed at [facility] [address] [place], [place] [address]    Culture NO GROWTH 2 DAYS  Final   Report Status PENDING  Incomplete  Culture, blood (routine x 2)     Status: None (Preliminary result)   Collection Time: 01/01/19 11:10 AM  Result Value Ref Range Status   Specimen Description BLOOD LEFT ARM  Final   Special Requests   Final    BOTTLES DRAWN AEROBIC AND ANAEROBIC Blood Culture adequate volume Performed at [facility] [address] [place], [place] [address]    Culture NO GROWTH 2 DAYS  Final   Report Status PENDING  Incomplete  Urine Culture     Status: Abnormal   Collection Time: 01/01/19  2:40 PM  Result Value Ref Range Status   Specimen Description   Final    URINE, CLEAN CATCH Performed at [facility], [place] [address] [place], [place] [address]    Special Requests   Final    NONE Performed at [facility], [place] [address] [place], [place] [address]    Culture (A)  Final    <10,000 COLONIES/mL INSIGNIFICANT GROWTH Performed at [facility] [address] [place], [place] [address]    Report Status 01/02/2019 FINAL  Final  MRSA PCR Screening     Status: None   Collection  Time: 01/02/19 10:30 AM  Result Value Ref Range Status   MRSA by PCR NEGATIVE NEGATIVE Final    Comment:        The GeneXpert MRSA Assay (FDA approved for NASAL specimens only), is one component of a comprehensive MRSA colonization surveillance program. It is not intended to diagnose MRSA infection nor to guide or monitor treatment for MRSA infections. Performed at [facility], [place] [address] [place], [place] [address]          Radiology Studies: Ct Abdomen Pelvis Wo Contrast  Addendum Date: 01/02/2019   ADDENDUM REPORT: 01/02/2019 10:53 ADDENDUM: These results were called by telephone at the time of interpretation on 01/02/2019 at 10:43 am to Dr. Niel Hummer , who verbally acknowledged these results. Electronically Signed   By: Jacqulynn Cadet M.D.   On: 01/02/2019 10:53   Result Date: 01/02/2019 CLINICAL DATA:  53 year old female with lethargy, decreased arousal and new onset anemia. Patient underwent liver biopsy yesterday. Evaluate for hemorrhage. EXAM: CT ABDOMEN AND PELVIS WITHOUT CONTRAST TECHNIQUE: Multidetector CT imaging of the abdomen and pelvis was performed following the standard protocol without IV contrast. COMPARISON:  Recent prior CT scan of the abdomen and pelvis 12/31/2018; MRI of the abdomen 01/01/2019 and CT scan of the chest also 01/01/2019 FINDINGS: Lower chest: Increased round atelectasis in the posterior aspect of the right lower lobe persistent left lower lobe atelectasis. Trace right pleural effusion. The intracardiac blood pool is hypodense relative to the adjacent myocardium consistent with anemia. Hepatobiliary: Hepatomegaly with multiple hepatic masses as previously seen. There is new high attenuation within the mass in the posterior aspect of the right hemi liver as well as several small foci of gas likely representing the site of prior biopsy complicated by some intralesional hemorrhage. Gallstones again noted within the gallbladder.  Pancreas: Unremarkable. No pancreatic ductal dilatation or surrounding inflammatory changes. Spleen: Normal in size without focal abnormality. Adrenals/Urinary Tract: Normal adrenal glands. No evidence of hydronephrosis or nephrolithiasis. 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 Stomach/Bowel: No focal bowel wall thickening or evidence of obstruction. Normal appendix in the right lower quadrant. Vascular/Lymphatic: Limited evaluation in the absence of intravenous contrast. No evidence of aneurysm. Reproductive: Dystrophic calcified uterine fibroids. No adnexal masses. Other: Interval development of moderate hemoperitoneum predominantly layering in the anatomic pelvis with a trace amount in the pericolic gutters. Musculoskeletal: No acute fracture or aggressive appearing lytic or blastic osseous lesion. IMPRESSION: 1. Positive for new intralesional hemorrhage and new small-moderate hemoperitoneum layering in the anatomic pelvis. Findings likely represent bleeding from the recent biopsy site. 2. Slightly increased bilateral lower lobe atelectasis. 3. Otherwise, no significant interval change compared to recent prior imaging. Electronically Signed: By: Jacqulynn Cadet M.D. On: 01/02/2019 10:32        Scheduled Meds: . sodium chloride   Intravenous Once  . pantoprazole (PROTONIX) IV  40 mg Intravenous Q12H  . polyethylene glycol  17 g Oral BID  . senna-docusate  1 tablet Oral BID   Continuous Infusions: . sodium chloride Stopped (01/03/19 1721)  . ampicillin-sulbactam (UNASYN) IV Stopped (01/04/19 0714)     LOS: 0 days    Time spent: 35 minutes    Elmarie Shiley, MD [facility] Pager [phone]  If 7PM-7AM, please contact night-coverage [email or web address] Password [email or web address] 01/04/2019, 7:40 AM

## 2019-01-05 ENCOUNTER — Other Ambulatory Visit: Payer: Self-pay | Admitting: Hematology

## 2019-01-05 ENCOUNTER — Inpatient Hospital Stay (HOSPITAL_COMMUNITY): Payer: Medicaid Other

## 2019-01-05 ENCOUNTER — Ambulatory Visit
Admit: 2019-01-05 | Discharge: 2019-01-05 | Disposition: A | Discharge status: Home / Self Care | Payer: Self-pay | Attending: Radiation Oncology | Admitting: Radiation Oncology

## 2019-01-05 DIAGNOSIS — F411 Generalized anxiety disorder: Secondary | ICD-10-CM

## 2019-01-05 DIAGNOSIS — R160 Hepatomegaly, not elsewhere classified: Secondary | ICD-10-CM

## 2019-01-05 DIAGNOSIS — R1010 Upper abdominal pain, unspecified: Secondary | ICD-10-CM

## 2019-01-05 DIAGNOSIS — Z515 Encounter for palliative care: Secondary | ICD-10-CM

## 2019-01-05 DIAGNOSIS — C801 Malignant (primary) neoplasm, unspecified: Principal | ICD-10-CM

## 2019-01-05 DIAGNOSIS — C787 Secondary malignant neoplasm of liver and intrahepatic bile duct: Secondary | ICD-10-CM

## 2019-01-05 DIAGNOSIS — D134 Benign neoplasm of liver: Principal | ICD-10-CM

## 2019-01-05 LAB — COMPREHENSIVE METABOLIC PANEL
ALT: 173 U/L — ABNORMAL HIGH (ref 0–44)
AST: 43 U/L — ABNORMAL HIGH (ref 15–41)
Albumin: 2.5 g/dL — ABNORMAL LOW (ref 3.5–5.0)
Alkaline Phosphatase: 210 U/L — ABNORMAL HIGH (ref 38–126)
Anion gap: 11 (ref 5–15)
BUN: 5 mg/dL — ABNORMAL LOW (ref 6–20)
CO2: 23 mmol/L (ref 22–32)
Calcium: 8 mg/dL — ABNORMAL LOW (ref 8.9–10.3)
Chloride: 105 mmol/L (ref 98–111)
Creatinine, Ser: 0.53 mg/dL (ref 0.44–1.00)
GFR calc Af Amer: >60 mL/min (ref >60)
GFR calc non Af Amer: >60 mL/min (ref >60)
Glucose, Bld: 112 mg/dL — ABNORMAL HIGH (ref 70–99)
Potassium: 3.6 mmol/L (ref 3.5–5.1)
Sodium: 139 mmol/L (ref 135–145)
Total Bilirubin: 1.2 mg/dL (ref 0.3–1.2)
Total Protein: 6.3 g/dL — ABNORMAL LOW (ref 6.5–8.1)

## 2019-01-05 LAB — CBC
HCT: 27.7 % — ABNORMAL LOW (ref 36.0–46.0)
Hemoglobin: 8.5 g/dL — ABNORMAL LOW (ref 12.0–15.0)
MCH: 28.9 pg (ref 26.0–34.0)
MCHC: 30.7 g/dL (ref 30.0–36.0)
MCV: 94.2 fL (ref 80.0–100.0)
PLATELETS: 350 10*3/uL (ref 150–400)
RBC: 2.94 MIL/uL — ABNORMAL LOW (ref 3.87–5.11)
RDW: 13.8 % (ref 11.5–15.5)
WBC: 14.4 10*3/uL — ABNORMAL HIGH (ref 4.0–10.5)
nRBC: 0.1 % (ref 0.0–0.2)

## 2019-01-05 LAB — GLUCOSE, CAPILLARY: GLUCOSE-CAPILLARY: 87 mg/dL (ref 70–99)

## 2019-01-05 LAB — PROCALCITONIN: Procalcitonin: 0.68 ng/mL

## 2019-01-05 LAB — URINE CULTURE: Culture: NO GROWTH

## 2019-01-05 MED ORDER — IOPAMIDOL (ISOVUE-300) INJECTION 61%
INTRAVENOUS | Status: AC
  Filled 2019-01-05: qty 100

## 2019-01-05 MED ORDER — VANCOMYCIN HCL IN DEXTROSE 1-5 GM/200ML-% IV SOLN
1000.0000 mg | Freq: Two times a day (BID) | INTRAVENOUS | Status: DC
  Administered 2019-01-06 (×2): 1000 mg via INTRAVENOUS
  Filled 2019-01-05 (×2): qty 200

## 2019-01-05 MED ORDER — HYDROMORPHONE HCL 1 MG/ML IJ SOLN
0.5000 mg | INTRAMUSCULAR | Status: DC | PRN
  Administered 2019-01-05 – 2019-01-08 (×7): 0.5 mg via INTRAVENOUS
  Filled 2019-01-05: qty 1
  Filled 2019-01-05 (×2): qty 0.5
  Filled 2019-01-05: qty 1
  Filled 2019-01-05: qty 0.5
  Filled 2019-01-05: qty 1
  Filled 2019-01-05: qty 0.5

## 2019-01-05 MED ORDER — SODIUM CHLORIDE 0.9 % IV SOLN
1.0000 g | Freq: Three times a day (TID) | INTRAVENOUS | Status: DC
  Administered 2019-01-05 – 2019-01-08 (×10): 1 g via INTRAVENOUS
  Filled 2019-01-05 (×13): qty 1

## 2019-01-05 MED ORDER — SODIUM CHLORIDE (PF) 0.9 % IJ SOLN
INTRAMUSCULAR | Status: AC
  Filled 2019-01-05: qty 50

## 2019-01-05 MED ORDER — VANCOMYCIN HCL 10 G IV SOLR
1750.0000 mg | Freq: Once | INTRAVENOUS | Status: AC
  Administered 2019-01-05: 1750 mg via INTRAVENOUS
  Filled 2019-01-05: qty 1750

## 2019-01-05 MED ORDER — IOPAMIDOL (ISOVUE-300) INJECTION 61%
100.0000 mL | Freq: Once | INTRAVENOUS | Status: AC | PRN
  Administered 2019-01-05: 100 mL via INTRAVENOUS

## 2019-01-05 MED ORDER — LORAZEPAM 0.5 MG PO TABS
0.5000 mg | ORAL_TABLET | Freq: Four times a day (QID) | ORAL | Status: DC | PRN
  Administered 2019-01-05: 0.5 mg via ORAL
  Filled 2019-01-05: qty 1

## 2019-01-05 MED ORDER — HYDROMORPHONE HCL 1 MG/ML IJ SOLN: 0.5000 mg | INTRAMUSCULAR | Status: DC | PRN

## 2019-01-05 NOTE — Progress Notes (Addendum)
Jodi Forbes   DOB:1966/04/06   RX#:[id number]    HEME/ONC OVERVIEW: 1. Hepatocellular adenomas of the liver  -12/2017: presented to ED for abdominal pain; [place] and CT showed extensive liver mets (largest 9.2 x 9.1cm) occupying ~70% of the liver; findings confirmed on MRI abdomen; CT chest showed scattered tiny bilateral pulmonary nodules, non-specific -Liver bx on 01/01/2019, path showing benign hepatocellular adenoma; complicated by intralesional bleeding within the hepatic metastasis and hemiperitoneum  ASSESSMENT & PLAN:   Benign hepatocellular adenomas -Liver biopsy showed benign hepatocellular adenoma -I discussed the case at length with the pathologist, who confirmed the benign liver changes with no evidence of malignancy present -We will plan to discuss the case at the tumor board next week -I also discussed the result in detail with the patient and her family; while the biopsy demonstrated benign adenoma, there are multiple other lesions within the liver that should be monitored closely for any interval change via imaging surveillance; repeat biopsies may be high risk unless there are sufficient suspicious findings to justify the potential risks of re-biopsies  -The patient expressed understanding; the patient's family was upset about the previous discussions regarding the possible cancer diagnosis; I informed them that the imaging findings were very concerning for cancer based on the radiology reports, but I also emphasized that until the biopsy results returned, the diagnosis cannot be ascertained -She should be established for GI/hepatology follow-up for close monitoring of the liver lesions; in the absence of confirmed malignancy, oncology follow-up is not indicated   Intralesional hepatic hemorrhage and new hemiperitoneum -Hemoglobin stable overall -No indication for surgery per GI surgery -IR following, no intervention planned at this time -I will defer the management to the  primary team and IR  Acute on chronic normocytic anemia -Secondary to intralesional bleeding within the hepatic metastases and hemoperitoneum -See the management of bleeding above -I recommend supportive transfusion to keep Hgb > 7  Abdominal pain -Patient is currently on several opioid medications -I recommend consulting palliative care for pain control -No role for palliative RT in the absence of definitive evidence for malignancy  Thank you for the opportunity to participate in Jodi Forbes's care.  Please not hesitate to contact me if there are any questions. Oncology will sign off now. Patient should be set up for outpatient GI/hepatology follow-up for close monitoring of the liver lesions. If future biopsy demonstrates malignancy, oncology would be glad to assist with further evaluation.  Tish Men, MD 01/05/2019  5:13 PM   Subjective:  Jodi Forbes reports that she still has persistent right upper quadrant and right flank pain, but overall it is stable. She denies any other complaint today.   ROS: Constitutional: ( - ) fevers, ( - )  chills , ( - ) night sweats Ears, nose, mouth, throat, and face: ( - ) mucositis, ( - ) sore throat Respiratory: ( - ) cough, ( - ) dyspnea, ( - ) wheezes Cardiovascular: ( - ) palpitation, ( - ) chest discomfort, ( - ) lower extremity swelling Gastrointestinal:  ( - ) nausea, ( - ) heartburn, ( - ) change in bowel habits Skin: ( - ) abnormal skin rashes Behavioral/Psych: ( - ) mood change, ( - ) new changes  All other systems were reviewed with the patient and are negative.  Objective:  Vitals:   01/05/19 1510 01/05/19 1600  BP:  (!) 159/76  Pulse:  (!) 125  Resp:  (!) 29  Temp: (!) 102.1 F (38.9 C)  SpO2:  98%     Intake/Output Summary (Last 24 hours) at 01/05/2019 1713 Last data filed at 01/05/2019 1500 Gross per 24 hour  Intake 2379.84 ml  Output 2300 ml  Net 79.84 ml    GENERAL: Mildly uncomfortable, lying flat in bed,  alert SKIN: skin color, texture, turgor are normal, no rashes or significant lesions OROPHARYNX: Slightly dry oral mucosa NECK: supple, non-tender LUNGS: clear to auscultation and percussion with normal breathing effort HEART: regular rate & rhythm and no murmurs and no lower extremity edema ABDOMEN: soft, mildly distended, mild to moderate tenderness to palpation in the right upper quadrant/right flank, slightly decreased bowel sounds PSYCH: alert & oriented x 3, fluent speech   Labs:  Lab Results  Component Value Date   WBC 14.4 (H) 01/05/2019   HGB 8.5 (L) 01/05/2019   HCT 27.7 (L) 01/05/2019   MCV 94.2 01/05/2019   PLT 350 01/05/2019   NEUTROABS 11.3 (H) 01/01/2019    Lab Results  Component Value Date   NA 139 01/05/2019   K 3.6 01/05/2019   CL 105 01/05/2019   CO2 23 01/05/2019    Studies:  Dg Chest Port 1 View  Result Date: 01/05/2019 CLINICAL DATA:  Fevers EXAM: PORTABLE CHEST 1 VIEW COMPARISON:  12/31/2018 FINDINGS: Cardiac shadow is within normal limits. The left lung remains clear. Elevation the right hemidiaphragm is noted with increasing right-sided pleural effusion and basilar atelectasis. No acute bony abnormality is seen. IMPRESSION: Increasing right-sided effusion and basilar atelectasis. Electronically Signed   By: Inez Catalina M.D.   On: 01/05/2019 13:34

## 2019-01-05 NOTE — Progress Notes (Signed)
Pharmacy Antibiotic Note  Jodi Forbes is a 53 y.o. female admitted on 12/31/2018 with abdominal pain, liver masses seen on MRI.  R/o primary liver cancer vs metastasis vs infection.  01/05/2019 T max 102.9, WBC 14.4, to change Unasyn to vanc/cefepime to cover for PNA.  CXR with atelectasis and effusion.    Plan: Cefepime 1 gm IV q8h Vancomycin 1750 mg IV loading dose followed by 1 gm IV q12 for est AUC 542 (Scr 0.6, IBW/TBW Vd 0.5) Follow up renal fxn, culture results, and clinical course.   Patient's MRSA PCR nasal swab is NEGATIVE.  A negative PCR is consistent with >98% negative predictive value in correlating with negative MRSA pneumonia. Consider stopping vancomycin.  Height: 5\' 4"  (162.6 cm) Weight: 193 lb 12.6 oz (87.9 kg) IBW/kg (Calculated) : 54.7  Temp (24hrs), Avg:99.9 F (37.7 C), Min:97.7 F (36.5 C), Max:102.9 F (39.4 C)  Recent Labs  Lab 12/31/18 1626 01/01/19 0528 01/02/19 0525 01/03/19 0622 01/04/19 0320 01/05/19 0303  WBC 13.3* 13.4* 14.9*  --  15.8* 14.4*  CREATININE 0.63 0.76 0.61 0.48 0.57 0.53    Estimated Creatinine Clearance: 89.3 mL/min (by C-G formula based on SCr of 0.53 mg/dL).    No Known Allergies Antimicrobials this admission: 1/3 Unasyn >> 1/7 1/7 Vanc>> 1/7 cefepime>>  Dose adjustments this admission:    Microbiology results: 1/3 BCx: ngtd 1/3 UCx: < 10K growth-final 1/3 Liver bx:  1/3 HIV: non-reactive  1/3 Hep panel: neg 1/4 MRSA PCR: neg 1/4 Flu PCR: neg/neg 1/6 UCx: [place], Pharm.D [phone] 01/05/2019 4:31 PM

## 2019-01-05 NOTE — Progress Notes (Signed)
I spoke with Dr. Lyndon Code in Pathology and her biopsy shows a primary benign adenoma of the liver. I let the patient know as well, and I've placed a call to hepatology in [place]. We will also discuss her case in GI conference as well tomorrow.

## 2019-01-05 NOTE — Consult Note (Signed)
NAME:  Jodi Forbes, MRN:  [id number], DOB:  1966-12-06, LOS: 1 ADMISSION DATE:  12/31/2018, CONSULTATION DATE:  01/05/19 REFERRING MD:  Rudolpho Sevin MD, CHIEF COMPLAINT: Pleural effusion  Brief History   53 year old with no significant past medical history admitted with abdominal pain, found to have liver lesions concerning for malignancy, underwent ultrasound-guided biopsy by IR.  Postop developed bleeding and hemoperitoneum.  PCCM consulted on 1/7 for fevers and chest x-ray showing right pleural effusion.  Past Medical History  None  Significant Hospital Events   1/2- Admit 1/3-biopsy of liver mass 1/4-drop in hemoglobin, hemoperitoneum, developed fevers 1/7- PCCM consulted for persistent fevers, right pleural effusion.  Consults:  PCCM Surgery IR  Procedures:  1/3- Liver biopsy  Significant Diagnostic Tests:  CT abdomen pelvis 1/2- numerous nonspecific liver mass measuring 9.2 cm, 8 mm right lower lobe pulmonary nodule.  Multiple uterine fibroids.  CT chest 1/3- tiny bilateral pulmonary nodules.  Known hepatic lesions  CT abdomen pelvis 1/4-intralesional hemorrhage, new small-moderate hemoperitoneum, slightly increased bilateral lower lobe atelectasis.  Chest x-ray 1/7- right hemidiaphragm elevation with pleural effusion. I have reviewed the images personally.  Micro Data:  Blood cultures 1/3- no growth Urine culture 1/3 < 10K colonies Urine culture 1/6- no growth  Antimicrobials:  Unasyn 1/3- 1/7 Cefepime 1/7- Vanco 1/7-   Interim history/subjective:    Objective   Blood pressure (!) 159/76, pulse (!) 125, temperature (!) 102.1 F (38.9 C), temperature source Oral, resp. rate (!) 29, height 5\' 4"  (1.626 m), weight 87.9 kg, SpO2 98 %.        Intake/Output Summary (Last 24 hours) at 01/05/2019 1624 Last data filed at 01/05/2019 1500 Gross per 24 hour  Intake 2479.84 ml  Output 2300 ml  Net 179.84 ml   Filed Weights   12/31/18 1555 01/02/19 1030    Weight: 84.8 kg 87.9 kg    Examination: Gen:      No acute distress HEENT:  EOMI, sclera anicteric Neck:     No masses; no thyromegaly Lungs:    Clear to auscultation bilaterally; normal respiratory effort CV:         Regular rate and rhythm; no murmurs Abd:      + bowel sounds; soft, non-tender; no palpable masses, no distension Ext:    No edema; adequate peripheral perfusion Skin:      Warm and dry; no rash Neuro: alert and oriented x 3 Psych: normal mood and affect  Resolved Hospital Problem list     Assessment & Plan:  53 year old with hepatic lesions being worked up for malignancy Developed hemoperitoneum after liver biopsy. Now with fevers, right diaphragm elevation with possible pleural effusion  I am not sure if this is a primary lung process or result of the hemoperitoneum tracking upwards Agree with broad antibiotic coverage for now Blood cultures.  Check procalcitonin. I will order a CT chest abdomen pelvis to reevaluate   Labs   CBC: Recent Labs  Lab 12/31/18 1626 01/01/19 0528 01/02/19 0525  01/03/19 1136 01/03/19 1958 01/04/19 0320 01/04/19 1951 01/05/19 0303  WBC 13.3* 13.4* 14.9*  --   --   --  15.8*  --  14.4*  NEUTROABS  --  11.3*  --   --   --   --   --   --   --   HGB 11.1* 8.4* 6.9*   < > 8.8* 8.6* 8.4* 8.4* 8.5*  HCT 35.4* 27.7* 23.0*   < > 28.1* 28.1* 27.2* 27.0* 27.7*  MCV 90.8 92.3 93.9  --   --   --  95.4  --  94.2  PLT 419* 400 350  --   --   --  271  --  350   < > = values in this interval not displayed.    Basic Metabolic Panel: Recent Labs  Lab 01/01/19 0528 01/02/19 0525 01/03/19 0622 01/04/19 0320 01/05/19 0303  NA 139 138 138 138 139  K 4.0 3.4* 3.5 4.1 3.6  CL 108 106 106 104 105  CO2 21* 23 22 25 23   GLUCOSE 114* 124* 101* 109* 112*  BUN 26* 14 6 6  5*  CREATININE 0.76 0.61 0.48 0.57 0.53  CALCIUM 8.4* 7.9* 8.0* 8.2* 8.0*   GFR: Estimated Creatinine Clearance: 89.3 mL/min (by C-G formula based on SCr of 0.53  mg/dL). Recent Labs  Lab 01/01/19 0528 01/02/19 0525 01/04/19 0320 01/05/19 0303  WBC 13.4* 14.9* 15.8* 14.4*    Liver Function Tests: Recent Labs  Lab 01/01/19 0528 01/02/19 0525 01/03/19 0622 01/04/19 0320 01/05/19 0303  AST 414* 662* 198* 81* 43*  ALT 393* 604* 383* 264* 173*  ALKPHOS 209* 233* 223* 221* 210*  BILITOT 0.8 0.9 1.5* 1.2 1.2  PROT 6.2* 5.8* 5.7* 6.2* 6.3*  ALBUMIN 2.9* 2.7* 2.6* 2.6* 2.5*   Recent Labs  Lab 12/31/18 1626  LIPASE 25   No results for input(s): AMMONIA in the last 168 hours.  ABG No results found for: PHART, PCO2ART, PO2ART, HCO3, TCO2, ACIDBASEDEF, O2SAT   Coagulation Profile: Recent Labs  Lab 01/01/19 0528 01/02/19 1057  INR 1.20 1.21    Cardiac Enzymes: Recent Labs  Lab 12/31/18 1626 12/31/18 2006  TROPONINI <0.03 <0.03    HbA1C: No results found for: HGBA1C  CBG: Recent Labs  Lab 01/01/19 0752 01/02/19 0731 01/03/19 0833 01/04/19 0750 01/05/19 0743  GLUCAP 101* 112* 107* 98 87    Review of Systems:   All negative; except for those that are bolded, which indicate positives.  Constitutional: weight loss, weight gain, night sweats, fevers, chills, fatigue, weakness.  HEENT: headaches, sore throat, sneezing, nasal congestion, post nasal drip, difficulty swallowing, tooth/dental problems, visual complaints, visual changes, ear aches. Neuro: difficulty with speech, weakness, numbness, ataxia. CV:  chest pain, orthopnea, PND, swelling in lower extremities, dizziness, palpitations, syncope.  Resp: cough, hemoptysis, dyspnea, wheezing. GI: heartburn, indigestion, abdominal pain, nausea, vomiting, diarrhea, constipation, change in bowel habits, loss of appetite, hematemesis, melena, hematochezia.  GU: dysuria, change in color of urine, urgency or frequency, flank pain, hematuria. MSK: joint pain or swelling, decreased range of motion. Psych: change in mood or affect, depression, anxiety, suicidal ideations, homicidal  ideations. Skin: rash, itching, bruising.  Past Medical History  She,  has a past medical history of Medical history non-contributory and Psoriasis (2013).   Surgical History    Past Surgical History:  Procedure Laterality Date  . IR US GUIDE BX ASP/DRAIN  01/01/2019     Social History   reports that she has never smoked. She has never used smokeless tobacco. She reports that she does not drink alcohol or use drugs.   Family History   Her family history includes Breast cancer in her maternal grandmother; CAD in her father; Colon cancer in her maternal grandfather; Diabetes in her mother; Pancreatic cancer in an other family member.   Allergies No Known Allergies   Home Medications  Prior to Admission medications   Not on File    The patient is critically ill with multiple  organ system failure and requires high complexity decision making for assessment and support, frequent evaluation and titration of therapies, advanced monitoring, review of radiographic studies and interpretation of complex data.   Critical Care Time devoted to patient care services, exclusive of separately billable procedures, described in this note is 35 minutes.   Marshell Garfinkel MD [facility] Pager [phone] If no answer call [phone] 01/05/2019, 4:46 PM

## 2019-01-05 NOTE — Progress Notes (Signed)
Patient ID: Jodi Forbes, female   DOB: 10-28-1966, 53 y.o.   MRN: [id number]       Subjective: Pt feels a little better.  Still with some fevers.  Tolerating a solid diet with no increase in pain or nausea.   Objective: Vital signs in last 24 hours: Temp:  [98.3 F (36.8 C)-102.9 F (39.4 C)] 100.5 F (38.1 C) (01/07 0402) Pulse Rate:  [116-135] 120 (01/07 0400) Resp:  [17-35] 32 (01/07 0200) BP: (124-162)/(57-87) 131/68 (01/07 0400) SpO2:  [91 %-98 %] 95 % (01/07 0400) Last BM Date: 01/04/19  Intake/Output from previous day: 01/06 0701 - 01/07 0700 In: 3581.4 [P.O.:1000; I.V.:2183.4; IV Piggyback:397.9] Out: 1900 [Urine:1900] Intake/Output this shift: No intake/output data recorded.  PE: Heart: tachy Lungs: CTAB Abd: soft, some tenderness in RUQ, +BS, ND, mildly obese  Lab Results:  Recent Labs    01/04/19 0320 01/04/19 1951 01/05/19 0303  WBC 15.8*  --  14.4*  HGB 8.4* 8.4* 8.5*  HCT 27.2* 27.0* 27.7*  PLT 271  --  350   BMET Recent Labs    01/04/19 0320 01/05/19 0303  NA 138 139  K 4.1 3.6  CL 104 105  CO2 25 23  GLUCOSE 109* 112*  BUN 6 5*  CREATININE 0.57 0.53  CALCIUM 8.2* 8.0*   PT/INR Recent Labs    01/02/19 1057  LABPROT 15.2  INR 1.21   CMP     Component Value Date/Time   NA 139 01/05/2019 0303   K 3.6 01/05/2019 0303   CL 105 01/05/2019 0303   CO2 23 01/05/2019 0303   GLUCOSE 112 (H) 01/05/2019 0303   BUN 5 (L) 01/05/2019 0303   CREATININE 0.53 01/05/2019 0303   CALCIUM 8.0 (L) 01/05/2019 0303   PROT 6.3 (L) 01/05/2019 0303   ALBUMIN 2.5 (L) 01/05/2019 0303   AST 43 (H) 01/05/2019 0303   ALT 173 (H) 01/05/2019 0303   ALKPHOS 210 (H) 01/05/2019 0303   BILITOT 1.2 01/05/2019 0303   GFRNONAA >60 01/05/2019 0303   GFRAA >60 01/05/2019 0303   Lipase     Component Value Date/Time   LIPASE 25 12/31/2018 1626       Studies/Results: No results found.  Anti-infectives: Anti-infectives (From admission, onward)   Start     Dose/Rate Route Frequency Ordered Stop   01/01/19 1800  Ampicillin-Sulbactam (UNASYN) 3 g in sodium chloride 0.9 % 100 mL IVPB     3 g 200 mL/hr over 30 Minutes Intravenous Every 6 hours 01/01/19 1736         Assessment/Plan RUQ abdoinal pain, hepatic masses, gallstones -s/p liver bx, path pending.  Defer to medical service and oncology for further treatment of liver lesions -has gallstones, but no role for surgical intervention in the setting of hepatic masses.  MRCP reviewed given continued fevers and tachycardia.  No evidence of acute cholecystitis noted on recent imaging.  Do not suspect her gallbladder if the source of her current findings. -we will sign off and defer further care and work up to the medical service/IR service/oncology service.  FEN - regular diet VTE - none currently due to recent liver bleed ID - unasyn   LOS: 1 day    Henreitta Cea , [facility] 01/05/2019, 8:23 AM Pager: [phone]

## 2019-01-05 NOTE — Consult Note (Signed)
Consultation Note Date: 01/05/2019   Patient Name: Jodi Forbes  DOB: 02-28-1966  MRN: [id number]  Age / Sex: 53 y.o., female  PCP: Patient, No Pcp Per Referring Physician: Elmarie Shiley, MD  Reason for Consultation: Establishing goals of care, Non pain symptom management, Pain control and Psychosocial/spiritual support  HPI/Patient Profile: 53 y.o. female admitted on 12/31/2018 with  what appears to be malignant tumor in the liver.   Patient had sudden onset of  abdominal pain. She reports she had nausea, vomiting.   She was found on abdominal ultrasound to have a 6.2 x 4.7 x 4 cm mass within the right hepatic lobe adjacent to the gallbladder and a 5.5 x 2.1 x 2.9 cm mass in the left hepatic lobe.     CT imaging also on 12/31/2018 that revealed numerous liver masses occupying approximately 70% of the liver parenchyma measuring up to 9.2 x 9.1 cm in the right lobe of the liver, several appeared peripheral and exophytic though no extrahepatic invasion was identified.  An 8 mm right lower lobe nodule was noted, and she had multiple uterine fibroids measuring up to 4.2 cm.  No other significant findings were noted, an MRI of the abdomen with and without contrast on 01/01/2019 revealed bibasilar atelectasis, no obvious pulmonary lesions, normal-appearing distal esophagus, innumerable large masses throughout both lobes of the liver confirmed from prior imaging, and no adenopathy was identified.    A CT chest with contrast on 01/01/2019 revealed several small bilateral nodules that were too small to characterize, and no evidence of primary malignancy in the thorax was otherwise noted.    Biopsy  pathology is pending.  Of note since undergoing her biopsy, she did have a small episode of bleeding from the procedure, this did require administering blood products, and her hemoglobin has since stabilized.   Patient faces the  emotional impact of  rapid changes in her medical situation, treatment options and care needs.  Clinical Assessment and Goals of Care:  This NP Wadie Lessen reviewed medical records, received report from team, assessed the patient and then meet at the patient's bedside to intoduce the role of pallaitive medcine into a hoistic treatemtn plan   to discuss diagnosis and symptom management  Values and goals of care important to patient and family were attempted to be elicited.  Emotional support offered, created space for patient  to explore thoughts and feeling regarding current medical situation.  "I'm just waiting for the biopsy results"  Questions and concerns addressed.  Patient encouraged to call with questions or concerns.    PMT will continue to support holistically.     SUMMARY OF RECOMMENDATIONS    Code Status/Advance Care Planning:  Full code   Symptom Management:   Pain: Dilaudid 0.5 mg IV every 2 hrs prn  Anxiety: Ativan 0.5 mg po every 6 hrs prn  Palliative Prophylaxis:   Frequent Pain Assessment  Additional Recommendations (Limitations, Scope, Preferences):  Full Scope Treatment  Psycho-social/Spiritual:   Desire for further Chaplaincy support:no  Additional  Recommendations: emotional support offered  Prognosis:   Unable to determine  Discharge Planning: To Be Determined      Primary Diagnoses: Present on Admission: . Liver masses . Liver mass   I have reviewed the medical record, interviewed the patient and family, and examined the patient. The following aspects are pertinent.  Past Medical History:  Diagnosis Date  . Medical history non-contributory   . Psoriasis 2013   bx'd by dermatology per report   Social History   Socioeconomic History  . Marital status: Single    Spouse name: Not on file  . Number of children: Not on file  . Years of education: Not on file  . Highest education level: Not on file  Occupational History  . Not  on file  Social Needs  . Financial resource strain: Not on file  . Food insecurity:    Worry: Not on file    Inability: Not on file  . Transportation needs:    Medical: Not on file    Non-medical: Not on file  Tobacco Use  . Smoking status: Never Smoker  . Smokeless tobacco: Never Used  Substance and Sexual Activity  . Alcohol use: Never    Frequency: Never  . Drug use: Never  . Sexual activity: Not on file  Lifestyle  . Physical activity:    Days per week: Not on file    Minutes per session: Not on file  . Stress: Not on file  Relationships  . Social connections:    Talks on phone: Not on file    Gets together: Not on file    Attends religious service: Not on file    Active member of club or organization: Not on file    Attends meetings of clubs or organizations: Not on file    Relationship status: Not on file  Other Topics Concern  . Not on file  Social History Narrative  . Not on file   Family History  Problem Relation Age of Onset  . Diabetes Mother   . CAD Father   . Breast cancer Maternal Grandmother   . Colon cancer Maternal Grandfather   . Pancreatic cancer Other        Maternal Great Uncle   Scheduled Meds: . sodium chloride   Intravenous Once  . mouth rinse  15 mL Mouth Rinse BID  . pantoprazole (PROTONIX) IV  40 mg Intravenous Q12H  . polyethylene glycol  17 g Oral BID  . senna-docusate  1 tablet Oral BID   Continuous Infusions: . sodium chloride 100 mL/hr at 01/05/19 0400  . ampicillin-sulbactam (UNASYN) IV 3 g (01/05/19 0620)   PRN Meds:.acetaminophen **OR** acetaminophen, hydrALAZINE, HYDROmorphone (DILAUDID) injection, LORazepam, ondansetron **OR** ondansetron (ZOFRAN) IV, oxyCODONE Medications Prior to Admission:  Prior to Admission medications   Not on File   No Known Allergies Review of Systems  Gastrointestinal: Positive for abdominal pain.  Neurological: Positive for weakness.    Physical Exam  Vital Signs: BP 131/68 (BP  Location: Left Arm)   Pulse (!) 120   Temp 99 F (37.2 C) (Oral)   Resp (!) 32   Ht 5\' 4"  (1.626 m)   Wt 87.9 kg   SpO2 95%   BMI 33.26 kg/m  Pain Scale: 0-10 POSS *See Group Information*: S-Acceptable,Sleep, easy to arouse Pain Score: 4    SpO2: SpO2: 95 % O2 Device:SpO2: 95 % O2 Flow Rate: .O2 Flow Rate (L/min): 2 L/min  IO: Intake/output summary:   Intake/Output Summary (Last  24 hours) at 01/05/2019 1006 Last data filed at 01/05/2019 0720 Gross per 24 hour  Intake 2281.37 ml  Output 2300 ml  Net -18.63 ml    LBM: Last BM Date: 01/04/19 Baseline Weight: Weight: 84.8 kg Most recent weight: Weight: 87.9 kg     Palliative Assessment/Data:    Discussed with bedside RN  Time In: 0900 Time Out: 1015 Time Total: 75  minutes Greater than 50%  of this time was spent counseling and coordinating care related to the above assessment and plan.  Signed by: Wadie Lessen, NP   Please contact Palliative Medicine Team phone at [phone] for questions and concerns.  For individual provider: See Shea Evans

## 2019-01-05 NOTE — Progress Notes (Signed)
Patient ID: Jodi Forbes, female   DOB: Dec 08, 1966, 53 y.o.   MRN: [id number] Pt feels about the same today; denies worsening abd pain/N/V; some mild dyspnea; cont to have some fevers, remains tachy BP 159/76, HR 126, O2 sats 98% N/C abd soft,mild RUQ tenderness,+BS WBC 14.4, hgb 8.5(8.4), creat nl; LFT's decreasing; cx neg; CXR with some increase in rt effusion, ?sympathetic  A/P:  s/p right liver lesion bx 1/3 with subsequent intralesional hemorrhage and small to moderate hemoperitoneum; hgb stable at 8.5 but cont to have fevers /tachycardia; cx neg to date; also rt pleural effusion; incentive spirometry; path - hepatocellular adenoma; check f/u CT C/A/P; await surgical f/u /discuss with Dr. Nickola Major, [facility]

## 2019-01-05 NOTE — Progress Notes (Addendum)
PROGRESS NOTE    Jodi Forbes  XTG:[id number] DOB: [birth year] DOA: 12/31/2018 PCP: Patient, No Pcp Per    Brief Narrative:  53 year old with no significant past medical history who presented to the emergency department complaining of abdominal pain that started the night prior to admission she also report nausea vomiting restarted the day prior to admission. Evaluation in the ED showed creased transaminases alkaline phosphatase 265, leukocytosis at 13,.  Right upper quadrant ultrasound was concerning for large solid-appearing masses with concern for possible invasion into the gallbladder. CT abdomen show numerous nonspecific liver masses without extrahepatic invasion possible metastatic versus primary hepatocellular or biliary malignancy.  Surgery was consulted, patient underwent MRI of the abdomen which showed multiple liver masses could be related to metastatic disease or primary liver cancer.  Surgery recommended IR to do liver biopsy.  They did not think that the goal started was the primary source for pain. Patient underwent liver biopsy by IR January 01, 2019, subsequently patient developed a acute blood loss anemia.  Repeated CT scan confirmed retroperitoneal hematoma and new intralesional hemorrhage in the liver.  Patient was transferred to the stepdown unit she received 2 units of packed red blood cell.  Her hemoglobin has remained stable at 8.  Patient has been having fevers, and leukocytosis.  He was a started on IV Unasyn.  Blood cultures drawn 01/01/2019 has been negative.  Urine culture also negative.  I will repeat a chest x-ray today.   Assessment & Plan:   Principal Problem:   Liver masses Active Problems:   Leukocytosis   Thrombocytosis (HCC)   Normocytic anemia   Hemoperitoneum   Abdominal pain   Goals of care, counseling/discussion   Elevated AST (SGOT)   Acute blood loss anemia   Elevated ALT measurement   Hyperbilirubinemia   Liver mass  1-Liver masses;  metastatic cancer unknown primary;  blood cultures no growth less than 24 hours.  Patient underwent liver biopsy 01-01-2019 Alpha-fetoprotein level and CEA level negative HIV negative. Hepatitis panel negative.  CT chest multiples small pulmonary nodule.  Liver biopsy results pending.  Oncology consulted.  radiation oncology consulted. Plan proceed with  Simulation today and subsequently start radiation treatment on 01/06/2019. Continue with fentanyl and oxycodone for pain management.  Palliative care consulted to help with pain management.   Leukocytosis thrombocytosis, fever Could be reactive. blood cultures; no growth to date. influenza negative.  On IV Unasyn.  Follow trend Repeat UA negative.  Chest x ray showed atelectasis, effusion, will cover for PNA. Will start Vancomycin and zosyn . Repeat chest x ray in am. I asked  CCM to evaluate patient.  Also I spoke with Dr Harle Stanford regarding pathology results, they will see patient again. Does she needs HIDA scan? Marland Kitchen   Anemia;acute blood loss anemia; intralesional hemorrhage and retroperitoneum.  Also Iron deficiency anemia.  Hb this am drop to 6.9. two units of PRBC ordered. Hb increase to 8  CT abdomen ordered; showed new intralesional hemorrhage and new small moderate retroperitoneum in the pelvis.  IR following, recommend support care.  GI consulted, sign off.  I informed, Dr Ninfa Linden CT abdomen results. He recommend Blood transfusion and monitor hb.  Hb has been stable at 8. Bleeding appears to have stopped.    Atelectasis;  Incentive spirometry.   Hypokalemia; replete orally  Transaminases; from liver mass. Stable. Trending down.   Estimated body mass index is 33.26 kg/m as calculated from the following:   Height as of this encounter: 5\' 4"  (  1.626 m).   Weight as of this encounter: 87.9 kg.   DVT prophylaxis: SCDs Code Status: Full code Family Communication: Mother at bedside.  Disposition Plan: Remain in the hospital  for IV antibiotics and further evaluation of multiple liver mass, pain management.  Consultants:   Surgery  I R   Procedures:  Liver biopsy Antimicrobials:   Unasyn  Subjective: He was not able to have a small bowel movement, she is still complaining of abdominal pain.  She agreed to be seen by palliative care for pain management.  Objective: Vitals:   01/05/19 0400 01/05/19 0402 01/05/19 0720 01/05/19 1135  BP: 131/68     Pulse: (!) 120     Resp:      Temp:  (!) 100.5 F (38.1 C) 99 F (37.2 C) 97.7 F (36.5 C)  TempSrc:  Axillary Oral Oral  SpO2: 95%     Weight:      Height:        Intake/Output Summary (Last 24 hours) at 01/05/2019 1252 Last data filed at 01/05/2019 1122 Gross per 24 hour  Intake 2081.37 ml  Output 2300 ml  Net -218.63 ml   Filed Weights   12/31/18 1555 01/02/19 1030  Weight: 84.8 kg 87.9 kg    Examination:  General exam: No acute distress Respiratory system: Clear to auscultation Cardiovascular system: S1-S2 regular rhythm and rate Gastrointestinal system: Bowel sounds present, soft generalized tenderness Central nervous system: Alert, nonfocal Extremities: Symmetric power, no edema Skin: No rashes/   Data Reviewed: I have personally reviewed following labs and imaging studies  CBC: Recent Labs  Lab 12/31/18 1626 01/01/19 0528 01/02/19 0525  01/03/19 1136 01/03/19 1958 01/04/19 0320 01/04/19 1951 01/05/19 0303  WBC 13.3* 13.4* 14.9*  --   --   --  15.8*  --  14.4*  NEUTROABS  --  11.3*  --   --   --   --   --   --   --   HGB 11.1* 8.4* 6.9*   < > 8.8* 8.6* 8.4* 8.4* 8.5*  HCT 35.4* 27.7* 23.0*   < > 28.1* 28.1* 27.2* 27.0* 27.7*  MCV 90.8 92.3 93.9  --   --   --  95.4  --  94.2  PLT 419* 400 350  --   --   --  271  --  350   < > = values in this interval not displayed.   Basic Metabolic Panel: Recent Labs  Lab 01/01/19 0528 01/02/19 0525 01/03/19 0622 01/04/19 0320 01/05/19 0303  NA 139 138 138 138 139  K 4.0 3.4*  3.5 4.1 3.6  CL 108 106 106 104 105  CO2 21* 23 22 25 23   GLUCOSE 114* 124* 101* 109* 112*  BUN 26* 14 6 6  5*  CREATININE 0.76 0.61 0.48 0.57 0.53  CALCIUM 8.4* 7.9* 8.0* 8.2* 8.0*   GFR: Estimated Creatinine Clearance: 89.3 mL/min (by C-G formula based on SCr of 0.53 mg/dL). Liver Function Tests: Recent Labs  Lab 01/01/19 0528 01/02/19 0525 01/03/19 0622 01/04/19 0320 01/05/19 0303  AST 414* 662* 198* 81* 43*  ALT 393* 604* 383* 264* 173*  ALKPHOS 209* 233* 223* 221* 210*  BILITOT 0.8 0.9 1.5* 1.2 1.2  PROT 6.2* 5.8* 5.7* 6.2* 6.3*  ALBUMIN 2.9* 2.7* 2.6* 2.6* 2.5*   Recent Labs  Lab 12/31/18 1626  LIPASE 25   No results for input(s): AMMONIA in the last 168 hours. Coagulation Profile: Recent Labs  Lab 01/01/19 [phone]  01/02/19 1057  INR 1.20 1.21   Cardiac Enzymes: Recent Labs  Lab 12/31/18 1626 12/31/18 2006  TROPONINI <0.03 <0.03   BNP (last 3 results) No results for input(s): PROBNP in the last 8760 hours. HbA1C: No results for input(s): HGBA1C in the last 72 hours. CBG: Recent Labs  Lab 01/01/19 0752 01/02/19 0731 01/03/19 0833 01/04/19 0750 01/05/19 0743  GLUCAP 101* 112* 107* 98 87   Lipid Profile: No results for input(s): CHOL, HDL, LDLCALC, TRIG, CHOLHDL, LDLDIRECT in the last 72 hours. Thyroid Function Tests: No results for input(s): TSH, T4TOTAL, FREET4, T3FREE, THYROIDAB in the last 72 hours. Anemia Panel: No results for input(s): VITAMINB12, FOLATE, FERRITIN, TIBC, IRON, RETICCTPCT in the last 72 hours. Sepsis Labs: No results for input(s): PROCALCITON, LATICACIDVEN in the last 168 hours.  Recent Results (from the past 240 hour(s))  Culture, blood (routine x 2)     Status: None (Preliminary result)   Collection Time: 01/01/19 11:09 AM  Result Value Ref Range Status   Specimen Description   Final    BLOOD RIGHT HAND Performed at [facility] [address] [place], [place] [address]    Special Requests   Final      BOTTLES DRAWN AEROBIC AND ANAEROBIC Blood Culture adequate volume Performed at [facility] [address] [place], [place] [address]    Culture   Final    NO GROWTH 4 DAYS Performed at [facility], [place] [address] [place], [place] [address]    Report Status PENDING  Incomplete  Culture, blood (routine x 2)     Status: None (Preliminary result)   Collection Time: 01/01/19 11:10 AM  Result Value Ref Range Status   Specimen Description   Final    BLOOD LEFT ARM Performed at [facility] [address] [place], [place] [address]    Special Requests   Final    BOTTLES DRAWN AEROBIC AND ANAEROBIC Blood Culture adequate volume Performed at [facility] [address] [place], [place] [address]    Culture   Final    NO GROWTH 4 DAYS Performed at [facility], [place] [address], [place], [place] [address]    Report Status PENDING  Incomplete  Urine Culture     Status: Abnormal   Collection Time: 01/01/19  2:40 PM  Result Value Ref Range Status   Specimen Description   Final    URINE, CLEAN CATCH Performed at [facility], [place] [address] [place], [place] [address]    Special Requests   Final    NONE Performed at [facility], [place] [address] [place], [place] [address]    Culture (A)  Final    <10,000 COLONIES/mL INSIGNIFICANT GROWTH Performed at [facility] [address], [place], [place] [address]    Report Status 01/02/2019 FINAL  Final  MRSA PCR Screening     Status: None   Collection Time: 01/02/19 10:30 AM  Result Value Ref Range Status   MRSA by PCR NEGATIVE NEGATIVE Final    Comment:        The GeneXpert MRSA Assay (FDA approved for NASAL specimens only), is one component of a comprehensive MRSA colonization surveillance program. It is not intended to diagnose MRSA infection nor to guide or monitor treatment for MRSA infections. Performed at  [facility], [place] [address] [place], [place] [address]   Urine Culture  Status: None   Collection Time: 01/04/19  7:32 AM  Result Value Ref Range Status   Specimen Description   Final    URINE, RANDOM Performed at [facility] [address], [place], [place] [address]    Special Requests   Final    NONE Performed at [facility], [place] [address], [place], [place] [address]    Culture   Final    NO GROWTH Performed at [facility], [place] [address] [place], [place] [address]    Report Status 01/05/2019 FINAL  Final         Radiology Studies: No results found.      Scheduled Meds: . sodium chloride   Intravenous Once  . mouth rinse  15 mL Mouth Rinse BID  . pantoprazole (PROTONIX) IV  40 mg Intravenous Q12H  . polyethylene glycol  17 g Oral BID  . senna-docusate  1 tablet Oral BID   Continuous Infusions: . sodium chloride 100 mL/hr at 01/05/19 0400  . ampicillin-sulbactam (UNASYN) IV 3 g (01/05/19 1122)     LOS: 1 day    Time spent: 35 minutes    Elmarie Shiley, MD [facility] Pager [phone]  If 7PM-7AM, please contact night-coverage [email or web address] Password [facility] 01/05/2019, 12:52 PM

## 2019-01-06 ENCOUNTER — Inpatient Hospital Stay (HOSPITAL_COMMUNITY): Payer: Medicaid Other

## 2019-01-06 ENCOUNTER — Ambulatory Visit: Payer: Self-pay

## 2019-01-06 DIAGNOSIS — Z515 Encounter for palliative care: Secondary | ICD-10-CM

## 2019-01-06 DIAGNOSIS — F411 Generalized anxiety disorder: Secondary | ICD-10-CM

## 2019-01-06 LAB — BODY FLUID CELL COUNT WITH DIFFERENTIAL
Eos, Fluid: 0 %
Lymphs, Fluid: 17 %
Monocyte-Macrophage-Serous Fluid: 9 % — ABNORMAL LOW (ref 50–90)
Neutrophil Count, Fluid: 74 % — ABNORMAL HIGH (ref 0–25)
Total Nucleated Cell Count, Fluid: UNDETERMINED cu mm (ref 0–1000)

## 2019-01-06 LAB — COMPREHENSIVE METABOLIC PANEL
ALBUMIN: 2.2 g/dL — AB (ref 3.5–5.0)
ALT: 115 U/L — ABNORMAL HIGH (ref 0–44)
AST: 39 U/L (ref 15–41)
Alkaline Phosphatase: 175 U/L — ABNORMAL HIGH (ref 38–126)
Anion gap: 10 (ref 5–15)
BILIRUBIN TOTAL: 1.2 mg/dL (ref 0.3–1.2)
BUN: 7 mg/dL (ref 6–20)
CO2: 23 mmol/L (ref 22–32)
Calcium: 7.8 mg/dL — ABNORMAL LOW (ref 8.9–10.3)
Chloride: 105 mmol/L (ref 98–111)
Creatinine, Ser: 0.43 mg/dL — ABNORMAL LOW (ref 0.44–1.00)
GFR calc Af Amer: >60 mL/min (ref >60)
GFR calc non Af Amer: >60 mL/min (ref >60)
GLUCOSE: 97 mg/dL (ref 70–99)
Potassium: 3.2 mmol/L — ABNORMAL LOW (ref 3.5–5.1)
Sodium: 138 mmol/L (ref 135–145)
TOTAL PROTEIN: 5.6 g/dL — AB (ref 6.5–8.1)

## 2019-01-06 LAB — GLUCOSE, PLEURAL OR PERITONEAL FLUID: GLUCOSE FL: 92 mg/dL

## 2019-01-06 LAB — CBC
HEMATOCRIT: 25.4 % — AB (ref 36.0–46.0)
Hemoglobin: 7.7 g/dL — ABNORMAL LOW (ref 12.0–15.0)
MCH: 28.3 pg (ref 26.0–34.0)
MCHC: 30.3 g/dL (ref 30.0–36.0)
MCV: 93.4 fL (ref 80.0–100.0)
Platelets: 393 10*3/uL (ref 150–400)
RBC: 2.72 MIL/uL — ABNORMAL LOW (ref 3.87–5.11)
RDW: 13.7 % (ref 11.5–15.5)
WBC: 13.3 10*3/uL — ABNORMAL HIGH (ref 4.0–10.5)
nRBC: 0.2 % (ref 0.0–0.2)

## 2019-01-06 LAB — CULTURE, BLOOD (ROUTINE X 2)
Culture: NO GROWTH
Culture: NO GROWTH
Special Requests: ADEQUATE
Special Requests: ADEQUATE

## 2019-01-06 LAB — HEMOGLOBIN AND HEMATOCRIT, BLOOD
HCT: 27.7 % — ABNORMAL LOW (ref 36.0–46.0)
Hemoglobin: 8.5 g/dL — ABNORMAL LOW (ref 12.0–15.0)

## 2019-01-06 LAB — PROTEIN, PLEURAL OR PERITONEAL FLUID: TOTAL PROTEIN, FLUID: 3.4 g/dL

## 2019-01-06 LAB — LACTATE DEHYDROGENASE, PLEURAL OR PERITONEAL FLUID: LD, Fluid: 395 U/L — ABNORMAL HIGH (ref 3–23)

## 2019-01-06 LAB — PROCALCITONIN: Procalcitonin: 0.66 ng/mL

## 2019-01-06 LAB — GLUCOSE, CAPILLARY: Glucose-Capillary: 89 mg/dL (ref 70–99)

## 2019-01-06 LAB — GAMMA GT: GGT: 22 U/L (ref 7–50)

## 2019-01-06 LAB — MAGNESIUM: Magnesium: 2.1 mg/dL (ref 1.7–2.4)

## 2019-01-06 MED ORDER — TECHNETIUM TC 99M MEBROFENIN IV KIT
5.0000 | PACK | Freq: Once | INTRAVENOUS | Status: AC
  Administered 2019-01-06: 5 via INTRAVENOUS

## 2019-01-06 MED ORDER — POTASSIUM CHLORIDE CRYS ER 20 MEQ PO TBCR
40.0000 meq | EXTENDED_RELEASE_TABLET | Freq: Once | ORAL | Status: AC
  Administered 2019-01-06: 40 meq via ORAL
  Filled 2019-01-06: qty 2

## 2019-01-06 MED ORDER — INFLUENZA VAC SPLIT QUAD 0.5 ML IM SUSY: 0.5000 mL | PREFILLED_SYRINGE | INTRAMUSCULAR | Status: DC

## 2019-01-06 MED ORDER — POTASSIUM CHLORIDE 10 MEQ/100ML IV SOLN
10.0000 meq | INTRAVENOUS | Status: DC
  Administered 2019-01-06: 10 meq via INTRAVENOUS
  Filled 2019-01-06: qty 100

## 2019-01-06 MED ORDER — LEVALBUTEROL HCL 0.63 MG/3ML IN NEBU
0.6300 mg | INHALATION_SOLUTION | Freq: Four times a day (QID) | RESPIRATORY_TRACT | Status: DC | PRN
  Administered 2019-01-06: 0.63 mg via RESPIRATORY_TRACT
  Filled 2019-01-06: qty 3

## 2019-01-06 MED ORDER — LIDOCAINE HCL 1 % IJ SOLN
INTRAMUSCULAR | Status: AC
  Filled 2019-01-06: qty 20

## 2019-01-06 NOTE — Progress Notes (Signed)
Patient ID: Jodi Forbes, female   DOB: [birth year], 53 y.o.   MRN: [id number]  This NP visited patient at the bedside as a follow up to  yesterday's [place].  Patient is alert and oriented and tells me her pain is well managed with current medications.  She verbalizes an understanding that her biopsy was benign, her situation remains complicated/serious  and the  plan is to transfer her to [facility] for specialized liver care.  Jodi Forbes understands the seriousness of her current medical situation.  Patient is open to all offered and available medical interventions to prolong life.    Discussed with patient the importance of continued conversation with her medical providers regarding overall plan of care and treatment options,  ensuring decisions are within the context of the patients values and GOCs.  Questions and concerns addressed   Discussed with Dr [place] Pink  Total time spent on the unit was 20 minutes   PMT will continue to support holistically  Greater than 50% of the time was spent in counseling and coordination of care  Wadie Lessen NP  Palliative Medicine Team Team Phone # [phone] Pager [phone]

## 2019-01-06 NOTE — Progress Notes (Signed)
  Radiation Oncology         [phone]) [phone] ________________________________  Name: Jodi Forbes MRN: [id number]  Date: 01/05/2019  DOB: [birth year]  RESPIRATORY MOTION MANAGEMENT SIMULATION  NARRATIVE:  In order to account for effect of respiratory motion on target structures and other organs in the planning and delivery of radiotherapy, this patient underwent respiratory motion management simulation.  To accomplish this, when the patient was brought to the CT simulation planning suite, 4D respiratoy motion management CT images were obtained.  The CT images were loaded into the planning software.  Then, using a variety of tools including Cine, MIP, and standard views, the target volume and planning target volumes (PTV) were delineated.  Avoidance structures were contoured.  Treatment planning then occurred.  Dose volume histograms were generated and reviewed for each of the requested structure.  The resulting plan was carefully reviewed and approved today.   ------------------------------------------------  [name], MD, PhD

## 2019-01-06 NOTE — Progress Notes (Signed)
PROGRESS NOTE    Jodi Forbes  DQQ:[id number] DOB: 1966/03/02 DOA: 12/31/2018 PCP: Patient, No Pcp Per    Brief Narrative:  54 year old with no significant past medical history who presented to the emergency department complaining of abdominal pain that started the night prior to admission. She also report nausea vomiting restarted the day prior to admission. Evaluation in the ED showed creased transaminases alkaline phosphatase 265, leukocytosis at 13,.  Right upper quadrant ultrasound was concerning for large solid-appearing masses with concern for possible invasion into the gallbladder. CT abdomen show numerous nonspecific liver masses without extrahepatic invasion possible metastatic versus primary hepatocellular or biliary malignancy.  Surgery was consulted, patient underwent MRI of the abdomen which showed multiple liver masses could be related to metastatic disease or primary liver cancer.  Surgery recommended IR to do liver biopsy.  Pathology revealed benign adenoma. Patient underwent liver biopsy by IR January 01, 2019. Subsequently patient developed a acute blood loss anemia.  Repeated CT scan confirmed retroperitoneal hematoma and new intralesional hemorrhage in the liver.  Patient was transferred to the stepdown unit she received 2 units of packed red blood cell.  Her hemoglobin has remained stable at 8. Patient has been having fevers, and leukocytosis.  He was a started on IV Unasyn.  Blood cultures drawn 01/01/2019 has been negative.  Urine culture also negative.  HIDA scan was ordered.   Assessment & Plan:  Liver masses/transaminitis/biopsy positive for hepatocellular adenoma Patient underwent liver biopsy 01-01-2019 Alpha-fetoprotein level and CEA level negative HIV negative. Hepatitis panel negative.  CT chest multiples small pulmonary nodule.  Patient was seen by oncology and radiation oncology.  However pathology suggests hepatocellular adenoma.  Does not need any oncologic  treatment at this time.  Will need continued monitoring in the outpatient setting.   Patient with persistent pain.  Continue with current pain medication regimen.   LFTs are improving.  Leukocytosis/fever Most likely due to peritoneal hemorrhage. blood cultures; no growth to date. influenza negative.  Patient was initially placed on Unasyn.  Transitioned over to vancomycin and cefepime.  Chest x-ray showed atelectasis and effusion.  Pulmonology is following.  Due to persistent right-sided abdominal pain which is most likely due to the hematoma, HIDA scan has been ordered to further evaluate the gallbladder.  Procalcitonin level 0.66.  Anemia;acute blood loss anemia; intralesional hemorrhage and retroperitoneum.  Also Iron deficiency anemia.  Patient's hemoglobin dropped to 6.9.  Improved with 2 units of PRBCs.  Noted to be low again this morning.  We will recheck this afternoon.  CT scan was repeated which shows improvement in the hemorrhage.    Atelectasis/right-sided pleural effusion Incentive spirometry.  Continue antibiotics.  Effusion is probably related to the hemorrhage.  Hypokalemia Replace potassium intravenously.  Check magnesium.  Cholelithiasis No clear evidence for cholecystitis on ultrasound.  See discussion above.  HIDA scan is pending.   DVT prophylaxis: SCDs Code Status: Full code Family Communication: Mother at bedside.  Disposition Plan: Recheck hemoglobin later today.  Continue broad-spectrum antibiotics.  Mobilize.  Consultants:   Surgery  IR  Gastroenterology  Pulmonology   Procedures:  Liver biopsy  Antimicrobials:  Was on Unasyn.  Now on vancomycin and cefepime.  Subjective: Patient states that her abdominal pain is 4 out of 10 in intensity.  No nausea.  No shortness of breath.  No cough.  Objective: Vitals:   01/06/19 0200 01/06/19 0343 01/06/19 0400 01/06/19 0800  BP: 112/66  138/75   Pulse: (!) 108  (!) 106  Resp: 18  20   Temp:  98.2  F (36.8 C)  99.3 F (37.4 C)  TempSrc:  Oral  Oral  SpO2: 99%  100%   Weight:      Height:        Intake/Output Summary (Last 24 hours) at 01/06/2019 0811 Last data filed at 01/06/2019 0300 Gross per 24 hour  Intake 1687.66 ml  Output 2000 ml  Net -312.34 ml   Filed Weights   12/31/18 1555 01/02/19 1030  Weight: 84.8 kg 87.9 kg    Examination:  General appearance: Awake alert.  In no distress Resp: Mildly tachypneic at rest.  Diminished air entry at the bases without any wheezing rales or rhonchi. Cardio: S1-S2 is tachycardic regular.  No S3-S4.  No rubs murmurs or bruit.   GI: Abdomen is tender more so on the right side.  Especially in the right upper quadrant.  No rebound rigidity.  Mild guarding is appreciated.  Bowel sounds present. Extremities: No edema.  Full range of motion of lower extremities. Neurologic: Alert and oriented x3.  No focal neurological deficits.    Data Reviewed: I have personally reviewed following labs and imaging studies  CBC: Recent Labs  Lab 01/01/19 0528 01/02/19 0525  01/03/19 1958 01/04/19 0320 01/04/19 1951 01/05/19 0303 01/06/19 0305  WBC 13.4* 14.9*  --   --  15.8*  --  14.4* 13.3*  NEUTROABS 11.3*  --   --   --   --   --   --   --   HGB 8.4* 6.9*   < > 8.6* 8.4* 8.4* 8.5* 7.7*  HCT 27.7* 23.0*   < > 28.1* 27.2* 27.0* 27.7* 25.4*  MCV 92.3 93.9  --   --  95.4  --  94.2 93.4  PLT 400 350  --   --  271  --  350 393   < > = values in this interval not displayed.   Basic Metabolic Panel: Recent Labs  Lab 01/02/19 0525 01/03/19 0622 01/04/19 0320 01/05/19 0303 01/06/19 0305  NA 138 138 138 139 138  K 3.4* 3.5 4.1 3.6 3.2*  CL 106 106 104 105 105  CO2 23 22 25 23 23   GLUCOSE 124* 101* 109* 112* 97  BUN 14 6 6  5* 7  CREATININE 0.61 0.48 0.57 0.53 0.43*  CALCIUM 7.9* 8.0* 8.2* 8.0* 7.8*   GFR: Estimated Creatinine Clearance: 89.3 mL/min (A) (by C-G formula based on SCr of 0.43 mg/dL (L)). Liver Function Tests: Recent Labs    Lab 01/02/19 0525 01/03/19 0622 01/04/19 0320 01/05/19 0303 01/06/19 0305  AST 662* 198* 81* 43* 39  ALT 604* 383* 264* 173* 115*  ALKPHOS 233* 223* 221* 210* 175*  BILITOT 0.9 1.5* 1.2 1.2 1.2  PROT 5.8* 5.7* 6.2* 6.3* 5.6*  ALBUMIN 2.7* 2.6* 2.6* 2.5* 2.2*   Recent Labs  Lab 12/31/18 1626  LIPASE 25   Coagulation Profile: Recent Labs  Lab 01/01/19 0528 01/02/19 1057  INR 1.20 1.21   Cardiac Enzymes: Recent Labs  Lab 12/31/18 1626 12/31/18 2006  TROPONINI <0.03 <0.03   CBG: Recent Labs  Lab 01/01/19 0752 01/02/19 0731 01/03/19 0833 01/04/19 0750 01/05/19 0743  GLUCAP 101* 112* 107* 98 87   Sepsis Labs: Recent Labs  Lab 01/05/19 1701 01/06/19 0305  PROCALCITON 0.68 0.66    Recent Results (from the past 240 hour(s))  Culture, blood (routine x 2)     Status: None   Collection Time: 01/01/19 11:09 AM  Result Value Ref Range Status   Specimen Description   Final    BLOOD RIGHT HAND Performed at [facility] [address], [place], [place] [address]    Special Requests   Final    BOTTLES DRAWN AEROBIC AND ANAEROBIC Blood Culture adequate volume Performed at [facility] [address], [place], [place] [address]    Culture   Final    NO GROWTH 5 DAYS Performed at [facility], [place] [address] [place], [place] [address]    Report Status 01/06/2019 FINAL  Final  Culture, blood (routine x 2)     Status: None   Collection Time: 01/01/19 11:10 AM  Result Value Ref Range Status   Specimen Description   Final    BLOOD LEFT ARM Performed at [facility] [address], [place], [place] [address]    Special Requests   Final    BOTTLES DRAWN AEROBIC AND ANAEROBIC Blood Culture adequate volume Performed at [facility] [address] [place], [place] [address]    Culture   Final    NO GROWTH 5 DAYS Performed at [facility], [place] [address] [place],  [place] [address]    Report Status 01/06/2019 FINAL  Final  Urine Culture     Status: Abnormal   Collection Time: 01/01/19  2:40 PM  Result Value Ref Range Status   Specimen Description   Final    URINE, CLEAN CATCH Performed at [facility], [place] [address], [place], [place] [address]    Special Requests   Final    NONE Performed at [facility], [place] [address], [place], [place] [address]    Culture (A)  Final    <10,000 COLONIES/mL INSIGNIFICANT GROWTH Performed at [facility] [address] [place], [place] [address]    Report Status 01/02/2019 FINAL  Final  MRSA PCR Screening     Status: None   Collection Time: 01/02/19 10:30 AM  Result Value Ref Range Status   MRSA by PCR NEGATIVE NEGATIVE Final    Comment:        The GeneXpert MRSA Assay (FDA approved for NASAL specimens only), is one component of a comprehensive MRSA colonization surveillance program. It is not intended to diagnose MRSA infection nor to guide or monitor treatment for MRSA infections. Performed at [facility], [place] [address], [place], [place] [address]   Urine Culture     Status: None   Collection Time: 01/04/19  7:32 AM  Result Value Ref Range Status   Specimen Description   Final    URINE, RANDOM Performed at [facility] [address] [place], [place] [address]    Special Requests   Final    NONE Performed at [facility], [place] [address], [place], [place] [address]    Culture   Final    NO GROWTH Performed at [facility], [place] [address], [place], [place] [address]    Report Status 01/05/2019 FINAL  Final         Radiology Studies: Ct Chest W Contrast  Result Date: 01/05/2019 CLINICAL DATA:  Upper abdominal pain for 1.5 weeks radiating into the back. The patient underwent biopsy of liver mass 01/01/2019. Pathology showed a benign adenoma. EXAM: CT CHEST, ABDOMEN, AND PELVIS WITH  CONTRAST TECHNIQUE: Multidetector CT imaging of the chest, abdomen and pelvis was performed following the standard protocol  during bolus administration of intravenous contrast. CONTRAST:  100 mL ISOVUE-300 IOPAMIDOL (ISOVUE-300) INJECTION 61% COMPARISON:  CT abdomen and 01/02/2019 and 12/31/2018. CT chest 01/01/2019. FINDINGS: CT CHEST FINDINGS Cardiovascular: No significant vascular findings. Normal heart size. No pericardial effusion. Mediastinum/Nodes: No enlarged mediastinal, hilar, or axillary lymph nodes. Thyroid gland, trachea, and esophagus demonstrate no significant findings. Lungs/Pleura: Small to moderate right pleural effusion. Trace left pleural effusion. Compressive atelectasis of the right lower lobe. Minimal dependent atelectasis on the left is noted. Tiny bilateral pulmonary nodule seen on prior chest CT are unchanged. Musculoskeletal: Negative. CT ABDOMEN PELVIS FINDINGS Hepatobiliary: Multiple hepatic masses are identified as seen on comparison examination. 2-3 tiny locules of gas are noted a lesion in the posterior right hepatic lobe, decreased since the most recent exam. There is a small amount of perihepatic hemorrhage along the posterior right hepatic lobe. Gallstones without evidence of cholecystitis noted. Pancreas: Unremarkable. No pancreatic ductal dilatation or surrounding inflammatory changes. Spleen: Normal in size without focal abnormality. 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 The kidneys are otherwise unremarkable. The adrenal glands appear normal. Urinary bladder appears normal. Stomach/Bowel: Stomach is within normal limits. Appendix appears normal. No evidence of bowel wall thickening, distention, or inflammatory changes. Vascular/Lymphatic: No significant vascular findings are present. No enlarged abdominal or pelvic lymph nodes. Reproductive: Calcified uterine fibroid noted.  No adnexal mass. Other: Hemoperitoneum recent in the pelvis is  decreased in volume examination since the most recent exam. Musculoskeletal: Negative. IMPRESSION: No new abnormality since the most recent study. Multiple hepatic masses consistent with known benign adenomas. Small locules of gas in a lesion in the right hepatic lobe are seen and decreased since the most recent examination consistent with resolving post biopsy change. Decreased hemoperitoneum. Small to moderate right pleural effusion with associated compressive atelectasis of the right lower lobe. Minimal dependent atelectasis left lower lobe noted. No change in 2-3 tiny pulmonary nodules, nonspecific. Gallstones without evidence of cholecystitis. Electronically Signed   By: Inge Rise M.D.   On: 01/05/2019 19:49   Ct Abdomen Pelvis W Contrast  Result Date: 01/05/2019 CLINICAL DATA:  Upper abdominal pain for 1.5 weeks radiating into the back. The patient underwent biopsy of liver mass 01/01/2019. Pathology showed a benign adenoma. EXAM: CT CHEST, ABDOMEN, AND PELVIS WITH CONTRAST TECHNIQUE: Multidetector CT imaging of the chest, abdomen and pelvis was performed following the standard protocol during bolus administration of intravenous contrast. CONTRAST:  100 mL ISOVUE-300 IOPAMIDOL (ISOVUE-300) INJECTION 61% COMPARISON:  CT abdomen and 01/02/2019 and 12/31/2018. CT chest 01/01/2019. FINDINGS: CT CHEST FINDINGS Cardiovascular: No significant vascular findings. Normal heart size. No pericardial effusion. Mediastinum/Nodes: No enlarged mediastinal, hilar, or axillary lymph nodes. Thyroid gland, trachea, and esophagus demonstrate no significant findings. Lungs/Pleura: Small to moderate right pleural effusion. Trace left pleural effusion. Compressive atelectasis of the right lower lobe. Minimal dependent atelectasis on the left is noted. Tiny bilateral pulmonary nodule seen on prior chest CT are unchanged. Musculoskeletal: Negative. CT ABDOMEN PELVIS FINDINGS Hepatobiliary: Multiple hepatic masses are  identified as seen on comparison examination. 2-3 tiny locules of gas are noted a lesion in the posterior right hepatic lobe, decreased since the most recent exam. There is a small amount of perihepatic hemorrhage along the posterior right hepatic lobe. Gallstones without evidence of cholecystitis noted. Pancreas: Unremarkable. No pancreatic ductal dilatation or surrounding inflammatory changes. Spleen: Normal in size without focal abnormality. 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 The kidneys are otherwise unremarkable. The  adrenal glands appear normal. Urinary bladder appears normal. Stomach/Bowel: Stomach is within normal limits. Appendix appears normal. No evidence of bowel wall thickening, distention, or inflammatory changes. Vascular/Lymphatic: No significant vascular findings are present. No enlarged abdominal or pelvic lymph nodes. Reproductive: Calcified uterine fibroid noted.  No adnexal mass. Other: Hemoperitoneum recent in the pelvis is decreased in volume examination since the most recent exam. Musculoskeletal: Negative. IMPRESSION: No new abnormality since the most recent study. Multiple hepatic masses consistent with known benign adenomas. Small locules of gas in a lesion in the right hepatic lobe are seen and decreased since the most recent examination consistent with resolving post biopsy change. Decreased hemoperitoneum. Small to moderate right pleural effusion with associated compressive atelectasis of the right lower lobe. Minimal dependent atelectasis left lower lobe noted. No change in 2-3 tiny pulmonary nodules, nonspecific. Gallstones without evidence of cholecystitis. Electronically Signed   By: Inge Rise M.D.   On: 01/05/2019 19:49   Dg Chest Port 1 View  Result Date: 01/05/2019 CLINICAL DATA:  Fevers EXAM: PORTABLE CHEST 1 VIEW COMPARISON:  12/31/2018 FINDINGS: Cardiac shadow is within normal limits. The left lung remains clear. Elevation  the right hemidiaphragm is noted with increasing right-sided pleural effusion and basilar atelectasis. No acute bony abnormality is seen. IMPRESSION: Increasing right-sided effusion and basilar atelectasis. Electronically Signed   By: Inez Catalina M.D.   On: 01/05/2019 13:34        Scheduled Meds: . mouth rinse  15 mL Mouth Rinse BID  . pantoprazole (PROTONIX) IV  40 mg Intravenous Q12H  . polyethylene glycol  17 g Oral BID  . senna-docusate  1 tablet Oral BID   Continuous Infusions: . sodium chloride 50 mL/hr at 01/06/19 0300  . ceFEPime (MAXIPIME) IV Stopped (01/06/19 0257)  . potassium chloride 10 mEq (01/06/19 0801)  . vancomycin 1,000 mg (01/06/19 0757)     LOS: 2 days    Time spent: 35 minutes    [name], MD [facility] Pager [phone]  If 7PM-7AM, please contact night-coverage [email or web address] Password [email or web address] 01/06/2019, 8:11 AM

## 2019-01-06 NOTE — Procedures (Addendum)
Ultrasound-guided diagnostic and therapeutic right thoracentesis performed yielding 445 cc of bloody fluid. No immediate complications. Follow-up chest x-ray pending.The fluid was sent to the lab for preordered studies. EBL < 1cc.CCM notified.

## 2019-01-06 NOTE — Progress Notes (Signed)
Patient ID: Jodi Forbes, female   DOB: 1966/04/30, 53 y.o.   MRN: [id number]       Subjective: Patient still with some pain in her RUQ, but no worse. Tolerating solid food with no worsening of her pain, nausea, or vomiting.  Still with some fevers and we were called back to further evaluate the gallbladder.  Objective: Vital signs in last 24 hours: Temp:  [97.7 F (36.5 C)-102.1 F (38.9 C)] 98.2 F (36.8 C) (01/08 0343) Pulse Rate:  [106-141] 106 (01/08 0400) Resp:  [18-36] 20 (01/08 0400) BP: (112-174)/(66-89) 138/75 (01/08 0400) SpO2:  [95 %-100 %] 100 % (01/08 0400) Last BM Date: 01/04/19  Intake/Output from previous day: 01/07 0701 - 01/08 0700 In: 1787.7 [I.V.:1200; IV Piggyback:587.7] Out: 2500 [Urine:2500] Intake/Output this shift: No intake/output data recorded.  PE: Heart: regular, but tachy Lungs: CTAB,  Abd: soft, rotund, +BS, tender in RUQ and some in RLQ  Lab Results:  Recent Labs    01/05/19 0303 01/06/19 0305  WBC 14.4* 13.3*  HGB 8.5* 7.7*  HCT 27.7* 25.4*  PLT 350 393   BMET Recent Labs    01/05/19 0303 01/06/19 0305  NA 139 138  K 3.6 3.2*  CL 105 105  CO2 23 23  GLUCOSE 112* 97  BUN 5* 7  CREATININE 0.53 0.43*  CALCIUM 8.0* 7.8*   PT/INR No results for input(s): LABPROT, INR in the last 72 hours. CMP     Component Value Date/Time   NA 138 01/06/2019 0305   K 3.2 (L) 01/06/2019 0305   CL 105 01/06/2019 0305   CO2 23 01/06/2019 0305   GLUCOSE 97 01/06/2019 0305   BUN 7 01/06/2019 0305   CREATININE 0.43 (L) 01/06/2019 0305   CALCIUM 7.8 (L) 01/06/2019 0305   PROT 5.6 (L) 01/06/2019 0305   ALBUMIN 2.2 (L) 01/06/2019 0305   AST 39 01/06/2019 0305   ALT 115 (H) 01/06/2019 0305   ALKPHOS 175 (H) 01/06/2019 0305   BILITOT 1.2 01/06/2019 0305   GFRNONAA >60 01/06/2019 0305   GFRAA >60 01/06/2019 0305   Lipase     Component Value Date/Time   LIPASE 25 12/31/2018 1626       Studies/Results: Ct Chest W  Contrast  Result Date: 01/05/2019 CLINICAL DATA:  Upper abdominal pain for 1.5 weeks radiating into the back. The patient underwent biopsy of liver mass 01/01/2019. Pathology showed a benign adenoma. EXAM: CT CHEST, ABDOMEN, AND PELVIS WITH CONTRAST TECHNIQUE: Multidetector CT imaging of the chest, abdomen and pelvis was performed following the standard protocol during bolus administration of intravenous contrast. CONTRAST:  100 mL ISOVUE-300 IOPAMIDOL (ISOVUE-300) INJECTION 61% COMPARISON:  CT abdomen and 01/02/2019 and 12/31/2018. CT chest 01/01/2019. FINDINGS: CT CHEST FINDINGS Cardiovascular: No significant vascular findings. Normal heart size. No pericardial effusion. Mediastinum/Nodes: No enlarged mediastinal, hilar, or axillary lymph nodes. Thyroid gland, trachea, and esophagus demonstrate no significant findings. Lungs/Pleura: Small to moderate right pleural effusion. Trace left pleural effusion. Compressive atelectasis of the right lower lobe. Minimal dependent atelectasis on the left is noted. Tiny bilateral pulmonary nodule seen on prior chest CT are unchanged. Musculoskeletal: Negative. CT ABDOMEN PELVIS FINDINGS Hepatobiliary: Multiple hepatic masses are identified as seen on comparison examination. 2-3 tiny locules of gas are noted a lesion in the posterior right hepatic lobe, decreased since the most recent exam. There is a small amount of perihepatic hemorrhage along the posterior right hepatic lobe. Gallstones without evidence of cholecystitis noted. Pancreas: Unremarkable. No pancreatic ductal dilatation or  surrounding inflammatory changes. Spleen: Normal in size without focal abnormality. 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 The kidneys are otherwise unremarkable. The adrenal glands appear normal. Urinary bladder appears normal. Stomach/Bowel: Stomach is within normal limits. Appendix appears normal. No evidence of bowel wall thickening, distention, or  inflammatory changes. Vascular/Lymphatic: No significant vascular findings are present. No enlarged abdominal or pelvic lymph nodes. Reproductive: Calcified uterine fibroid noted.  No adnexal mass. Other: Hemoperitoneum recent in the pelvis is decreased in volume examination since the most recent exam. Musculoskeletal: Negative. IMPRESSION: No new abnormality since the most recent study. Multiple hepatic masses consistent with known benign adenomas. Small locules of gas in a lesion in the right hepatic lobe are seen and decreased since the most recent examination consistent with resolving post biopsy change. Decreased hemoperitoneum. Small to moderate right pleural effusion with associated compressive atelectasis of the right lower lobe. Minimal dependent atelectasis left lower lobe noted. No change in 2-3 tiny pulmonary nodules, nonspecific. Gallstones without evidence of cholecystitis. Electronically Signed   By: Inge Rise M.D.   On: 01/05/2019 19:49   Ct Abdomen Pelvis W Contrast  Result Date: 01/05/2019 CLINICAL DATA:  Upper abdominal pain for 1.5 weeks radiating into the back. The patient underwent biopsy of liver mass 01/01/2019. Pathology showed a benign adenoma. EXAM: CT CHEST, ABDOMEN, AND PELVIS WITH CONTRAST TECHNIQUE: Multidetector CT imaging of the chest, abdomen and pelvis was performed following the standard protocol during bolus administration of intravenous contrast. CONTRAST:  100 mL ISOVUE-300 IOPAMIDOL (ISOVUE-300) INJECTION 61% COMPARISON:  CT abdomen and 01/02/2019 and 12/31/2018. CT chest 01/01/2019. FINDINGS: CT CHEST FINDINGS Cardiovascular: No significant vascular findings. Normal heart size. No pericardial effusion. Mediastinum/Nodes: No enlarged mediastinal, hilar, or axillary lymph nodes. Thyroid gland, trachea, and esophagus demonstrate no significant findings. Lungs/Pleura: Small to moderate right pleural effusion. Trace left pleural effusion. Compressive atelectasis of the  right lower lobe. Minimal dependent atelectasis on the left is noted. Tiny bilateral pulmonary nodule seen on prior chest CT are unchanged. Musculoskeletal: Negative. CT ABDOMEN PELVIS FINDINGS Hepatobiliary: Multiple hepatic masses are identified as seen on comparison examination. 2-3 tiny locules of gas are noted a lesion in the posterior right hepatic lobe, decreased since the most recent exam. There is a small amount of perihepatic hemorrhage along the posterior right hepatic lobe. Gallstones without evidence of cholecystitis noted. Pancreas: Unremarkable. No pancreatic ductal dilatation or surrounding inflammatory changes. Spleen: Normal in size without focal abnormality. 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 The kidneys are otherwise unremarkable. The adrenal glands appear normal. Urinary bladder appears normal. Stomach/Bowel: Stomach is within normal limits. Appendix appears normal. No evidence of bowel wall thickening, distention, or inflammatory changes. Vascular/Lymphatic: No significant vascular findings are present. No enlarged abdominal or pelvic lymph nodes. Reproductive: Calcified uterine fibroid noted.  No adnexal mass. Other: Hemoperitoneum recent in the pelvis is decreased in volume examination since the most recent exam. Musculoskeletal: Negative. IMPRESSION: No new abnormality since the most recent study. Multiple hepatic masses consistent with known benign adenomas. Small locules of gas in a lesion in the right hepatic lobe are seen and decreased since the most recent examination consistent with resolving post biopsy change. Decreased hemoperitoneum. Small to moderate right pleural effusion with associated compressive atelectasis of the right lower lobe. Minimal dependent atelectasis left lower lobe noted. No change in 2-3 tiny pulmonary nodules, nonspecific. Gallstones without evidence of cholecystitis. Electronically Signed   By: Inge Rise M.D.  On: 01/05/2019 19:49   Dg Chest Port 1 View  Result Date: 01/05/2019 CLINICAL DATA:  Fevers EXAM: PORTABLE CHEST 1 VIEW COMPARISON:  12/31/2018 FINDINGS: Cardiac shadow is within normal limits. The left lung remains clear. Elevation the right hemidiaphragm is noted with increasing right-sided pleural effusion and basilar atelectasis. No acute bony abnormality is seen. IMPRESSION: Increasing right-sided effusion and basilar atelectasis. Electronically Signed   By: Inez Catalina M.D.   On: 01/05/2019 13:34    Anti-infectives: Anti-infectives (From admission, onward)   Start     Dose/Rate Route Frequency Ordered Stop   01/06/19 0800  vancomycin (VANCOCIN) IVPB 1000 mg/200 mL premix     1,000 mg 200 mL/hr over 60 Minutes Intravenous Every 12 hours 01/05/19 1924     01/05/19 1830  vancomycin (VANCOCIN) 1,750 mg in sodium chloride 0.9 % 500 mL IVPB     1,750 mg 250 mL/hr over 120 Minutes Intravenous  Once 01/05/19 1612 01/05/19 2112   01/05/19 1800  ceFEPIme (MAXIPIME) 1 g in sodium chloride 0.9 % 100 mL IVPB     1 g 200 mL/hr over 30 Minutes Intravenous Every 8 hours 01/05/19 1612     01/01/19 1800  Ampicillin-Sulbactam (UNASYN) 3 g in sodium chloride 0.9 % 100 mL IVPB  Status:  Discontinued     3 g 200 mL/hr over 30 Minutes Intravenous Every 6 hours 01/01/19 1736 01/05/19 1612       Assessment/Plan RUQ abdoinal pain, hepatic masses, gallstones -s/p liver bx, path reveals benign adenoma -has gallstones, but all previous imaging does not suggest cholecystitis.  Given persistent fevers, we will obtain a HIDA scan to further evaluate.  Given the process in her liver, it is possible the HIDA could be a false positive, but warrants further exploration.  It is still possible fevers are related to post biopsy bleed and inflammatory response as well.  If HIDA positive will have to discuss the possible need for surgical intervention.  FEN - NPO for HIDA VTE - none currently due to recent liver  bleed ID - unasyn   LOS: 2 days    Henreitta Cea , [facility] 01/06/2019, 8:06 AM Pager: [phone]

## 2019-01-06 NOTE — Progress Notes (Signed)
[facility] Progress Note  CC:  Liver lesion, elevated LFT's  Subjective: We have been reconsulted on this 53 year old female in regards to her elevated LFTs.  She was found to have a liver lesion and transaminitis upon admission.  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  Subsequently she developed retroperitoneal hematoma and intralesional hemorrhage.  Her LFTs have fluctuated during admission, but appears to been the highest on January 4 where they peaked with an AST of 662, ALT 604, alk phos 233, and total bili normal at 0.9.  Today they remain elevated, but downtrending with an alk phos of 175, ALT 115, AST 39, total bilirubin still normal at 1.2.  The patient is actually going to very likely be transferred to [facility] in [place] over the next couple of days, but hepatology there recommended full liver serology evaluation so GI was reconsulted in order to complete that request.  CT scan yesterday, 1/7, as follows:  IMPRESSION: No new abnormality since the most recent study.  Multiple hepatic masses consistent with known benign adenomas. Small locules of gas in a lesion in the right hepatic lobe are seen and decreased since the most recent examination consistent with resolving post biopsy change.  Decreased hemoperitoneum.  Small to moderate right pleural effusion with associated compressive atelectasis of the right lower lobe. Minimal dependent atelectasis left lower lobe noted.  No change in 2-3 tiny pulmonary nodules, nonspecific.  Gallstones without evidence of cholecystitis.  She has just returned from HIDA scan which was ordered by surgery to rule out bile duct obstruction as a cause for her elevated LFTs.  This was negative for such.  She reports that overall her pain is well controlled thanks to palliative care consult and changing her pain medications.  Still reports some pain around to the right side of her  back.  Objective:  Vital signs in last 24 hours: Temp:  [97.7 F (36.5 C)-102.1 F (38.9 C)] 99.3 F (37.4 C) (01/08 0800) Pulse Rate:  [106-141] 114 (01/08 0800) Resp:  [18-36] 24 (01/08 0800) BP: (112-174)/(66-89) 157/89 (01/08 0800) SpO2:  [95 %-100 %] 97 % (01/08 0800) Last BM Date: 01/05/19 General:  Alert, Well-developed, in NAD, slightly uncomfortable Heart:  Tachy; no murmurs Pulm:  CTAB.  No increased WOB. Abdomen:  Soft, non-distended.  BS present.  Some TTP in upper abdomen. Extremities:  Without edema. Neurologic:  Alert and oriented x 4;  grossly normal neurologically. Psych:  Alert and cooperative. Normal mood and affect.  Intake/Output from previous day: 01/07 0701 - 01/08 0700 In: 1787.7 [I.V.:1200; IV Piggyback:587.7] Out: 2500 [Urine:2500] Intake/Output this shift: Total I/O In: 114.6 [IV Piggyback:114.6] Out: -   Lab Results: Recent Labs    01/04/19 0320 01/04/19 1951 01/05/19 0303 01/06/19 0305  WBC 15.8*  --  14.4* 13.3*  HGB 8.4* 8.4* 8.5* 7.7*  HCT 27.2* 27.0* 27.7* 25.4*  PLT 271  --  350 393   BMET Recent Labs    01/04/19 0320 01/05/19 0303 01/06/19 0305  NA 138 139 138  K 4.1 3.6 3.2*  CL 104 105 105  CO2 25 23 23   GLUCOSE 109* 112* 97  BUN 6 5* 7  CREATININE 0.57 0.53 0.43*  CALCIUM 8.2* 8.0* 7.8*   LFT Recent Labs    01/04/19 0320  01/06/19 0305  PROT 6.2*   < > 5.6*  ALBUMIN 2.6*   < > 2.2*  AST 81*   < > 39  ALT  264*   < > 115*  ALKPHOS 221*   < > 175*  BILITOT 1.2   < > 1.2  BILIDIR 0.5*  --   --   IBILI 0.7  --   --    < > = values in this interval not displayed.    Dg Chest 2 View  Result Date: 01/06/2019 CLINICAL DATA:  Pleural effusion. EXAM: CHEST - 2 VIEW COMPARISON:  Chest x-rays dated 01/05/2019 and 12/31/2018 and chest CT dated 01/05/2019 FINDINGS: The moderate right effusion has slightly increased since 01/05/2019. Heart size and vascularity remain normal. Left lung is clear. Bones are normal.  IMPRESSION: Slight increase in the moderate right effusion. Electronically Signed   By: Lorriane Shire M.D.   On: 01/06/2019 09:07   Ct Chest W Contrast  Result Date: 01/05/2019 CLINICAL DATA:  Upper abdominal pain for 1.5 weeks radiating into the back. The patient underwent biopsy of liver mass 01/01/2019. Pathology showed a benign adenoma. EXAM: CT CHEST, ABDOMEN, AND PELVIS WITH CONTRAST TECHNIQUE: Multidetector CT imaging of the chest, abdomen and pelvis was performed following the standard protocol during bolus administration of intravenous contrast. CONTRAST:  100 mL ISOVUE-300 IOPAMIDOL (ISOVUE-300) INJECTION 61% COMPARISON:  CT abdomen and 01/02/2019 and 12/31/2018. CT chest 01/01/2019. FINDINGS: CT CHEST FINDINGS Cardiovascular: No significant vascular findings. Normal heart size. No pericardial effusion. Mediastinum/Nodes: No enlarged mediastinal, hilar, or axillary lymph nodes. Thyroid gland, trachea, and esophagus demonstrate no significant findings. Lungs/Pleura: Small to moderate right pleural effusion. Trace left pleural effusion. Compressive atelectasis of the right lower lobe. Minimal dependent atelectasis on the left is noted. Tiny bilateral pulmonary nodule seen on prior chest CT are unchanged. Musculoskeletal: Negative. CT ABDOMEN PELVIS FINDINGS Hepatobiliary: Multiple hepatic masses are identified as seen on comparison examination. 2-3 tiny locules of gas are noted a lesion in the posterior right hepatic lobe, decreased since the most recent exam. There is a small amount of perihepatic hemorrhage along the posterior right hepatic lobe. Gallstones without evidence of cholecystitis noted. Pancreas: Unremarkable. No pancreatic ductal dilatation or surrounding inflammatory changes. Spleen: Normal in size without focal abnormality. 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 The kidneys are otherwise unremarkable. The adrenal glands appear normal. Urinary bladder  appears normal. Stomach/Bowel: Stomach is within normal limits. Appendix appears normal. No evidence of bowel wall thickening, distention, or inflammatory changes. Vascular/Lymphatic: No significant vascular findings are present. No enlarged abdominal or pelvic lymph nodes. Reproductive: Calcified uterine fibroid noted.  No adnexal mass. Other: Hemoperitoneum recent in the pelvis is decreased in volume examination since the most recent exam. Musculoskeletal: Negative. IMPRESSION: No new abnormality since the most recent study. Multiple hepatic masses consistent with known benign adenomas. Small locules of gas in a lesion in the right hepatic lobe are seen and decreased since the most recent examination consistent with resolving post biopsy change. Decreased hemoperitoneum. Small to moderate right pleural effusion with associated compressive atelectasis of the right lower lobe. Minimal dependent atelectasis left lower lobe noted. No change in 2-3 tiny pulmonary nodules, nonspecific. Gallstones without evidence of cholecystitis. Electronically Signed   By: Inge Rise M.D.   On: 01/05/2019 19:49   Ct Abdomen Pelvis W Contrast  Result Date: 01/05/2019 CLINICAL DATA:  Upper abdominal pain for 1.5 weeks radiating into the back. The patient underwent biopsy of liver mass 01/01/2019. Pathology showed a benign adenoma. EXAM: CT CHEST, ABDOMEN, AND PELVIS WITH CONTRAST TECHNIQUE: Multidetector CT imaging of the chest, abdomen and pelvis was performed following  the standard protocol during bolus administration of intravenous contrast. CONTRAST:  100 mL ISOVUE-300 IOPAMIDOL (ISOVUE-300) INJECTION 61% COMPARISON:  CT abdomen and 01/02/2019 and 12/31/2018. CT chest 01/01/2019. FINDINGS: CT CHEST FINDINGS Cardiovascular: No significant vascular findings. Normal heart size. No pericardial effusion. Mediastinum/Nodes: No enlarged mediastinal, hilar, or axillary lymph nodes. Thyroid gland, trachea, and esophagus demonstrate  no significant findings. Lungs/Pleura: Small to moderate right pleural effusion. Trace left pleural effusion. Compressive atelectasis of the right lower lobe. Minimal dependent atelectasis on the left is noted. Tiny bilateral pulmonary nodule seen on prior chest CT are unchanged. Musculoskeletal: Negative. CT ABDOMEN PELVIS FINDINGS Hepatobiliary: Multiple hepatic masses are identified as seen on comparison examination. 2-3 tiny locules of gas are noted a lesion in the posterior right hepatic lobe, decreased since the most recent exam. There is a small amount of perihepatic hemorrhage along the posterior right hepatic lobe. Gallstones without evidence of cholecystitis noted. Pancreas: Unremarkable. No pancreatic ductal dilatation or surrounding inflammatory changes. Spleen: Normal in size without focal abnormality. 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 The kidneys are otherwise unremarkable. The adrenal glands appear normal. Urinary bladder appears normal. Stomach/Bowel: Stomach is within normal limits. Appendix appears normal. No evidence of bowel wall thickening, distention, or inflammatory changes. Vascular/Lymphatic: No significant vascular findings are present. No enlarged abdominal or pelvic lymph nodes. Reproductive: Calcified uterine fibroid noted.  No adnexal mass. Other: Hemoperitoneum recent in the pelvis is decreased in volume examination since the most recent exam. Musculoskeletal: Negative. IMPRESSION: No new abnormality since the most recent study. Multiple hepatic masses consistent with known benign adenomas. Small locules of gas in a lesion in the right hepatic lobe are seen and decreased since the most recent examination consistent with resolving post biopsy change. Decreased hemoperitoneum. Small to moderate right pleural effusion with associated compressive atelectasis of the right lower lobe. Minimal dependent atelectasis left lower lobe noted. No change in  2-3 tiny pulmonary nodules, nonspecific. Gallstones without evidence of cholecystitis. Electronically Signed   By: Inge Rise M.D.   On: 01/05/2019 19:49   Dg Chest Port 1 View  Result Date: 01/05/2019 CLINICAL DATA:  Fevers EXAM: PORTABLE CHEST 1 VIEW COMPARISON:  12/31/2018 FINDINGS: Cardiac shadow is within normal limits. The left lung remains clear. Elevation the right hemidiaphragm is noted with increasing right-sided pleural effusion and basilar atelectasis. No acute bony abnormality is seen. IMPRESSION: Increasing right-sided effusion and basilar atelectasis. Electronically Signed   By: Inez Catalina M.D.   On: 01/05/2019 13:34   Assessment / Plan: *53 year old female elevated LFTs and liver lesion that was positive for hepatocellular adenoma on biopsy.  Subsequently developed intralesional and retroperitoneal hematoma post biopsy.  Likely going to be transferred to [facility] for further management, but GI being consulted for further evaluation regarding elevated liver enzymes.  -We will complete serologic evaluation including ANA, AMA, anti-smooth muscle antibody, alpha-1 antitrypsin, ceruloplasmin, celiac labs, IgG4, GGT.  Acute hepatitis panel was negative, which includes hepatitis C antibody.  This cannot rule out acute infection with hepatitis C, however, so could consider hepatitis C RNA.  Iron studies unrevealing.   LOS: 2 days   Laban Emperor. Zaliah Wissner  01/06/2019, 9:31 AM

## 2019-01-06 NOTE — Progress Notes (Signed)
Assessed right pleural space with [place].  No clear pocket of fluid identified with bedside US.  Will discuss with IR regarding assessment with their [place] for review.     Noe Gens, NP-C  [facility] Pgr: [phone] or if no answer [phone] 01/06/2019, 12:01 PM

## 2019-01-06 NOTE — Progress Notes (Signed)
Patient is gone down for procedure RN Marolyn Hammock states she will re enter the consult when patient returns if PIV is still needed

## 2019-01-06 NOTE — Progress Notes (Signed)
  Radiation Oncology         [phone] ________________________________  Name: Christiana Gurevich MRN: [id number]  Date: 01/05/2019  DOB: 01-16-1966  SIMULATION AND TREATMENT PLANNING NOTE  DIAGNOSIS:     ICD-10-CM   1. 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  Identity was confirmed.  All relevant records and images related to the planned course of therapy were reviewed.   Written consent to proceed with treatment was confirmed which was freely given after reviewing the details related to the planned course of therapy had been reviewed with the patient.  Then, the patient was set-up in a stable reproducible  supine position for radiation therapy.  CT images were obtained.  Surface markings were placed.      The CT images were loaded into the planning software.  Then the target and avoidance structures were contoured.  Treatment planning then occurred.  The radiation prescription was entered and confirmed.  A total of 2 complex treatment devices were fabricated which relate to the designed radiation treatment fields. Each of these customized fields/ complex treatment devices will be used on a daily basis during the radiation course. I have requested : 3D Simulation  I have requested a DVH of the following structures: target volume, left kidney, right kidney, cord.   PLAN:  The patient will receive 21 Gy in 7 fractions.  ________________________________   [name], MD, PhD

## 2019-01-06 NOTE — Progress Notes (Signed)
NAME:  Jodi Forbes, MRN:  [id number], DOB:  08-Sep-1966, LOS: 2 ADMISSION DATE:  12/31/2018, CONSULTATION DATE:  01/05/19 REFERRING MD:  Rudolpho Sevin MD, CHIEF COMPLAINT: Pleural effusion  Brief History   53 year old with no significant past medical history admitted with abdominal pain, found to have liver lesions concerning for malignancy, underwent ultrasound-guided biopsy by IR.  Postop developed bleeding and hemoperitoneum.  PCCM consulted on 1/7 for fevers and chest x-ray showing right pleural effusion.  Past Medical History  None  Significant Hospital Events   1/2- Admit 1/3-biopsy of liver mass 1/4-drop in hemoglobin, hemoperitoneum, developed fevers 1/7- PCCM consulted for persistent fevers, right pleural effusion.  Consults:  PCCM Surgery IR GI  Procedures:  1/3- Liver biopsy  Significant Diagnostic Tests:  CT abdomen pelvis 1/2- numerous nonspecific liver mass measuring 9.2 cm, 8 mm right lower lobe pulmonary nodule.  Multiple uterine fibroids.  CT chest 1/3- tiny bilateral pulmonary nodules.  Known hepatic lesions  CT abdomen pelvis 1/4-intralesional hemorrhage, new small-moderate hemoperitoneum, slightly increased bilateral lower lobe atelectasis.  Chest x-ray 1/7- right hemidiaphragm elevation with pleural effusion. I have reviewed the images personally.  CT c/a/p 1/7> no cardiomegaly. Small/Moderate R pleural effusion with trace L pleural effusion. Bilateral lower lobe atelectasis R>L. Multiple hepatic masses. R posterior lobe hepatic lesion. Small perihepatic hemorrhage. Gallstones without cholecystitis. No evidence of bowel thickening, inflammatory changes. Normal appendix. Interval decrease in volume of hemoperitoneum   CXR 1/8> increase in size of R pleural effusion    Micro Data:  Blood cultures 1/3- no growth Urine culture 1/3 < 10K colonies Urine culture 1/6- no growth  Antimicrobials:  Unasyn 1/3- 1/7 Cefepime 1/7>> Vanc 1/7>>  Interim  history/subjective:  Patient with complaints of RUQ discomfort  Objective   Blood pressure (!) 157/89, pulse (!) 114, temperature 99.3 F (37.4 C), temperature source Oral, resp. rate (!) 24, height 5\' 4"  (1.626 m), weight 87.9 kg, SpO2 97 %.        Intake/Output Summary (Last 24 hours) at 01/06/2019 0847 Last data filed at 01/06/2019 0801 Gross per 24 hour  Intake 1802.22 ml  Output 2000 ml  Net -197.78 ml   Filed Weights   12/31/18 1555 01/02/19 1030  Weight: 84.8 kg 87.9 kg    Examination: Gen:      Adult female, NAD HEENT:  [place]/AT, PERRL Lungs:    Diminished bibasilar lung sounds  CV:         RRR no r/g/m Abd:      Normoactive bowel sounds. RUQ tenderness without rebound tenderness.  Ext:    Capillary refill < 3 sec, No peripheral edema Skin:      Clean dry warm intact Neuro: AAOx4 following commands    Resolved Hospital Problem list     Assessment & Plan:    Pleural Effusion -small-moderate R sided -trace L sided -Visualized on CT chest 1/7 -CXR 1/8 shows interval increase in size -Etiology unknown-- do not favor heart failure as etiology. Possible hepatic etiology, possibly related to hemoperitoneum, possible pulm process P -Continue broad spectrum abx -Evaluate patient for possible thoracentesis, with fluid analysis     Labs   CBC: Recent Labs  Lab 01/01/19 0528 01/02/19 0525  01/03/19 1958 01/04/19 0320 01/04/19 1951 01/05/19 0303 01/06/19 0305  WBC 13.4* 14.9*  --   --  15.8*  --  14.4* 13.3*  NEUTROABS 11.3*  --   --   --   --   --   --   --  HGB 8.4* 6.9*   < > 8.6* 8.4* 8.4* 8.5* 7.7*  HCT 27.7* 23.0*   < > 28.1* 27.2* 27.0* 27.7* 25.4*  MCV 92.3 93.9  --   --  95.4  --  94.2 93.4  PLT 400 350  --   --  271  --  350 393   < > = values in this interval not displayed.    Basic Metabolic Panel: Recent Labs  Lab 01/02/19 0525 01/03/19 0622 01/04/19 0320 01/05/19 0303 01/06/19 0305  NA 138 138 138 139 138  K 3.4* 3.5 4.1 3.6 3.2*  CL  106 106 104 105 105  CO2 23 22 25 23 23   GLUCOSE 124* 101* 109* 112* 97  BUN 14 6 6  5* 7  CREATININE 0.61 0.48 0.57 0.53 0.43*  CALCIUM 7.9* 8.0* 8.2* 8.0* 7.8*   GFR: Estimated Creatinine Clearance: 89.3 mL/min (A) (by C-G formula based on SCr of 0.43 mg/dL (L)). Recent Labs  Lab 01/02/19 0525 01/04/19 0320 01/05/19 0303 01/05/19 1701 01/06/19 0305  PROCALCITON  --   --   --  0.68 0.66  WBC 14.9* 15.8* 14.4*  --  13.3*    Liver Function Tests: Recent Labs  Lab 01/02/19 0525 01/03/19 0622 01/04/19 0320 01/05/19 0303 01/06/19 0305  AST 662* 198* 81* 43* 39  ALT 604* 383* 264* 173* 115*  ALKPHOS 233* 223* 221* 210* 175*  BILITOT 0.9 1.5* 1.2 1.2 1.2  PROT 5.8* 5.7* 6.2* 6.3* 5.6*  ALBUMIN 2.7* 2.6* 2.6* 2.5* 2.2*   Recent Labs  Lab 12/31/18 1626  LIPASE 25   No results for input(s): AMMONIA in the last 168 hours.  ABG No results found for: PHART, PCO2ART, PO2ART, HCO3, TCO2, ACIDBASEDEF, O2SAT   Coagulation Profile: Recent Labs  Lab 01/01/19 0528 01/02/19 1057  INR 1.20 1.21    Cardiac Enzymes: Recent Labs  Lab 12/31/18 1626 12/31/18 2006  TROPONINI <0.03 <0.03    HbA1C: No results found for: HGBA1C  CBG: Recent Labs  Lab 01/01/19 0752 01/02/19 0731 01/03/19 0833 01/04/19 0750 01/05/19 0743  GLUCAP 101* 112* [place] MSN, AGACNP-BC [facility] 01/06/2019, 9:06 AM

## 2019-01-07 ENCOUNTER — Ambulatory Visit: Payer: Self-pay

## 2019-01-07 LAB — COMPREHENSIVE METABOLIC PANEL
ALBUMIN: 2.1 g/dL — AB (ref 3.5–5.0)
ALT: 81 U/L — ABNORMAL HIGH (ref 0–44)
AST: 26 U/L (ref 15–41)
Alkaline Phosphatase: 168 U/L — ABNORMAL HIGH (ref 38–126)
Anion gap: 11 (ref 5–15)
BUN: 8 mg/dL (ref 6–20)
CO2: 23 mmol/L (ref 22–32)
Calcium: 7.8 mg/dL — ABNORMAL LOW (ref 8.9–10.3)
Chloride: 104 mmol/L (ref 98–111)
Creatinine, Ser: 0.47 mg/dL (ref 0.44–1.00)
GFR calc Af Amer: >60 mL/min (ref >60)
GFR calc non Af Amer: >60 mL/min (ref >60)
Glucose, Bld: 100 mg/dL — ABNORMAL HIGH (ref 70–99)
Potassium: 3.4 mmol/L — ABNORMAL LOW (ref 3.5–5.1)
Sodium: 138 mmol/L (ref 135–145)
Total Bilirubin: 0.9 mg/dL (ref 0.3–1.2)
Total Protein: 5.9 g/dL — ABNORMAL LOW (ref 6.5–8.1)

## 2019-01-07 LAB — CBC
HCT: 25.9 % — ABNORMAL LOW (ref 36.0–46.0)
Hemoglobin: 7.9 g/dL — ABNORMAL LOW (ref 12.0–15.0)
MCH: 28.1 pg (ref 26.0–34.0)
MCHC: 30.5 g/dL (ref 30.0–36.0)
MCV: 92.2 fL (ref 80.0–100.0)
Platelets: 454 10*3/uL — ABNORMAL HIGH (ref 150–400)
RBC: 2.81 MIL/uL — ABNORMAL LOW (ref 3.87–5.11)
RDW: 13.6 % (ref 11.5–15.5)
WBC: 13.5 10*3/uL — ABNORMAL HIGH (ref 4.0–10.5)
nRBC: 0 % (ref 0.0–0.2)

## 2019-01-07 LAB — MITOCHONDRIAL ANTIBODIES: Mitochondrial M2 Ab, IgG: <20 Units (ref 0.0–20.0)

## 2019-01-07 LAB — CERULOPLASMIN: Ceruloplasmin: 51.1 mg/dL — ABNORMAL HIGH (ref 19.0–39.0)

## 2019-01-07 LAB — GLUCOSE, CAPILLARY: Glucose-Capillary: 83 mg/dL (ref 70–99)

## 2019-01-07 LAB — GRAM STAIN

## 2019-01-07 LAB — PROCALCITONIN: Procalcitonin: 0.47 ng/mL

## 2019-01-07 LAB — ANTINUCLEAR ANTIBODIES, IFA: ANA Ab, IFA: NEGATIVE

## 2019-01-07 LAB — IGG 4: IgG, Subclass 4: 14 mg/dL (ref 2–96)

## 2019-01-07 LAB — ANTI-SMOOTH MUSCLE ANTIBODY, IGG: F-Actin IgG: 6 Units (ref 0–19)

## 2019-01-07 LAB — TISSUE TRANSGLUTAMINASE, IGA: Tissue Transglutaminase Ab, IgA: <2 U/mL (ref 0–3)

## 2019-01-07 LAB — IGA: IGA: 309 mg/dL (ref 87–352)

## 2019-01-07 LAB — MAGNESIUM: Magnesium: 2.1 mg/dL (ref 1.7–2.4)

## 2019-01-07 MED ORDER — INFLUENZA VAC SPLIT QUAD 0.5 ML IM SUSY: 0.5000 mL | PREFILLED_SYRINGE | INTRAMUSCULAR | Status: DC | PRN

## 2019-01-07 MED ORDER — POTASSIUM CHLORIDE CRYS ER 20 MEQ PO TBCR
40.0000 meq | EXTENDED_RELEASE_TABLET | Freq: Once | ORAL | Status: AC
  Administered 2019-01-07: 40 meq via ORAL
  Filled 2019-01-07: qty 2

## 2019-01-07 NOTE — Progress Notes (Signed)
Referring Physician(s): Niel Hummer A  Supervising Physician: Sandi Mariscal  Patient Status:  [facility] - In-pt  Chief Complaint: "Abdominal pain"  Subjective:  Intralesional and retroperitoneum hemorrhage s/p right liver lesion biopsy 01/01/2019 by Dr. Pascal Lux. Patient awake and alert laying in bed. Accompanied by mother at bedside. States that she feels better since right thoracentesis yesterday. Still complains of RUQ pain, stable since yesterday.   Allergies: Patient has no known allergies.  Medications: Prior to Admission medications   Not on File     Vital Signs: BP 128/68   Pulse 99   Temp 99.1 F (37.3 C) (Oral)   Resp 20   Ht 5\' 4"  (1.626 m)   Wt 193 lb 12.6 oz (87.9 kg)   SpO2 99%   BMI 33.26 kg/m   Physical Exam Vitals signs and nursing note reviewed.  Constitutional:      General: She is not in acute distress.    Appearance: Normal appearance.  Cardiovascular:     Rate and Rhythm: Regular rhythm. Tachycardia present.     Heart sounds: No murmur.  Pulmonary:     Effort: Pulmonary effort is normal. No respiratory distress.     Breath sounds: Normal breath sounds. No wheezing.  Abdominal:     Palpations: Abdomen is soft.     Tenderness: There is no guarding.     Comments: Mild RUQ tenderness.  Skin:    General: Skin is warm and dry.  Neurological:     Mental Status: She is alert and oriented to person, place, and time.  Psychiatric:        Mood and Affect: Mood normal.        Behavior: Behavior normal.        Thought Content: Thought content normal.        Judgment: Judgment normal.     Imaging: Dg Chest 2 View  Result Date: 01/06/2019 CLINICAL DATA:  Pleural effusion. EXAM: CHEST - 2 VIEW COMPARISON:  Chest x-rays dated 01/05/2019 and 12/31/2018 and chest CT dated 01/05/2019 FINDINGS: The moderate right effusion has slightly increased since 01/05/2019. Heart size and vascularity remain normal. Left lung is clear. Bones are normal.  IMPRESSION: Slight increase in the moderate right effusion. Electronically Signed   By: Lorriane Shire M.D.   On: 01/06/2019 09:07   Ct Chest W Contrast  Result Date: 01/05/2019 CLINICAL DATA:  Upper abdominal pain for 1.5 weeks radiating into the back. The patient underwent biopsy of liver mass 01/01/2019. Pathology showed a benign adenoma. EXAM: CT CHEST, ABDOMEN, AND PELVIS WITH CONTRAST TECHNIQUE: Multidetector CT imaging of the chest, abdomen and pelvis was performed following the standard protocol during bolus administration of intravenous contrast. CONTRAST:  100 mL ISOVUE-300 IOPAMIDOL (ISOVUE-300) INJECTION 61% COMPARISON:  CT abdomen and 01/02/2019 and 12/31/2018. CT chest 01/01/2019. FINDINGS: CT CHEST FINDINGS Cardiovascular: No significant vascular findings. Normal heart size. No pericardial effusion. Mediastinum/Nodes: No enlarged mediastinal, hilar, or axillary lymph nodes. Thyroid gland, trachea, and esophagus demonstrate no significant findings. Lungs/Pleura: Small to moderate right pleural effusion. Trace left pleural effusion. Compressive atelectasis of the right lower lobe. Minimal dependent atelectasis on the left is noted. Tiny bilateral pulmonary nodule seen on prior chest CT are unchanged. Musculoskeletal: Negative. CT ABDOMEN PELVIS FINDINGS Hepatobiliary: Multiple hepatic masses are identified as seen on comparison examination. 2-3 tiny locules of gas are noted a lesion in the posterior right hepatic lobe, decreased since the most recent exam. There is a small amount of perihepatic hemorrhage along  the posterior right hepatic lobe. Gallstones without evidence of cholecystitis noted. Pancreas: Unremarkable. No pancreatic ductal dilatation or surrounding inflammatory changes. Spleen: Normal in size without focal abnormality. 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 The kidneys are otherwise unremarkable. The adrenal glands appear normal. Urinary bladder  appears normal. Stomach/Bowel: Stomach is within normal limits. Appendix appears normal. No evidence of bowel wall thickening, distention, or inflammatory changes. Vascular/Lymphatic: No significant vascular findings are present. No enlarged abdominal or pelvic lymph nodes. Reproductive: Calcified uterine fibroid noted.  No adnexal mass. Other: Hemoperitoneum recent in the pelvis is decreased in volume examination since the most recent exam. Musculoskeletal: Negative. IMPRESSION: No new abnormality since the most recent study. Multiple hepatic masses consistent with known benign adenomas. Small locules of gas in a lesion in the right hepatic lobe are seen and decreased since the most recent examination consistent with resolving post biopsy change. Decreased hemoperitoneum. Small to moderate right pleural effusion with associated compressive atelectasis of the right lower lobe. Minimal dependent atelectasis left lower lobe noted. No change in 2-3 tiny pulmonary nodules, nonspecific. Gallstones without evidence of cholecystitis. Electronically Signed   By: Inge Rise M.D.   On: 01/05/2019 19:49   Nm Hepatobiliary Liver Func  Result Date: 01/06/2019 CLINICAL DATA:  Pain with nausea and vomiting EXAM: NUCLEAR MEDICINE HEPATOBILIARY IMAGING TECHNIQUE: Sequential images of the abdomen were obtained out to 60 minutes following intravenous administration of radiopharmaceutical. RADIOPHARMACEUTICALS:  5.0 mCi Tc-63m  Choletec IV COMPARISON:  None. FINDINGS: Prompt uptake and biliary excretion of activity by the liver is seen. There is abnormal radiotracer uptake throughout portions of the right lobe of the liver. Gallbladder activity is visualized, consistent with patency of cystic duct. Biliary activity passes into small bowel, consistent with patent common bile duct. No appreciable ectopic radiotracer uptake. IMPRESSION: Areas of abnormal radiotracer uptake in the liver of uncertain etiology. Patient is scheduled  for abdominal CT which should be helpful for further assessment of this abnormal radiotracer uptake in the liver. Cystic and common bile ducts are patent as is evidenced by visualization of gallbladder and small bowel. Electronically Signed   By: Lowella Grip III M.D.   On: 01/06/2019 10:49   Ct Abdomen Pelvis W Contrast  Result Date: 01/05/2019 CLINICAL DATA:  Upper abdominal pain for 1.5 weeks radiating into the back. The patient underwent biopsy of liver mass 01/01/2019. Pathology showed a benign adenoma. EXAM: CT CHEST, ABDOMEN, AND PELVIS WITH CONTRAST TECHNIQUE: Multidetector CT imaging of the chest, abdomen and pelvis was performed following the standard protocol during bolus administration of intravenous contrast. CONTRAST:  100 mL ISOVUE-300 IOPAMIDOL (ISOVUE-300) INJECTION 61% COMPARISON:  CT abdomen and 01/02/2019 and 12/31/2018. CT chest 01/01/2019. FINDINGS: CT CHEST FINDINGS Cardiovascular: No significant vascular findings. Normal heart size. No pericardial effusion. Mediastinum/Nodes: No enlarged mediastinal, hilar, or axillary lymph nodes. Thyroid gland, trachea, and esophagus demonstrate no significant findings. Lungs/Pleura: Small to moderate right pleural effusion. Trace left pleural effusion. Compressive atelectasis of the right lower lobe. Minimal dependent atelectasis on the left is noted. Tiny bilateral pulmonary nodule seen on prior chest CT are unchanged. Musculoskeletal: Negative. CT ABDOMEN PELVIS FINDINGS Hepatobiliary: Multiple hepatic masses are identified as seen on comparison examination. 2-3 tiny locules of gas are noted a lesion in the posterior right hepatic lobe, decreased since the most recent exam. There is a small amount of perihepatic hemorrhage along the posterior right hepatic lobe. Gallstones without evidence of cholecystitis noted. Pancreas: Unremarkable. No pancreatic ductal dilatation  or surrounding inflammatory changes. Spleen: Normal in size without focal  abnormality. 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 The kidneys are otherwise unremarkable. The adrenal glands appear normal. Urinary bladder appears normal. Stomach/Bowel: Stomach is within normal limits. Appendix appears normal. No evidence of bowel wall thickening, distention, or inflammatory changes. Vascular/Lymphatic: No significant vascular findings are present. No enlarged abdominal or pelvic lymph nodes. Reproductive: Calcified uterine fibroid noted.  No adnexal mass. Other: Hemoperitoneum recent in the pelvis is decreased in volume examination since the most recent exam. Musculoskeletal: Negative. IMPRESSION: No new abnormality since the most recent study. Multiple hepatic masses consistent with known benign adenomas. Small locules of gas in a lesion in the right hepatic lobe are seen and decreased since the most recent examination consistent with resolving post biopsy change. Decreased hemoperitoneum. Small to moderate right pleural effusion with associated compressive atelectasis of the right lower lobe. Minimal dependent atelectasis left lower lobe noted. No change in 2-3 tiny pulmonary nodules, nonspecific. Gallstones without evidence of cholecystitis. Electronically Signed   By: Inge Rise M.D.   On: 01/05/2019 19:49   Dg Chest Port 1 View  Result Date: 01/06/2019 CLINICAL DATA:  Status post right thoracentesis EXAM: PORTABLE CHEST 1 VIEW COMPARISON:  Chest radiographs dated 01/06/2019. CT chest dated 01/05/2019. FINDINGS: Volume loss in the right hemithorax. Patchy right lower lung opacities. Left lung is clear. No pneumothorax is seen. The heart is normal in size. IMPRESSION: No pneumothorax status post right thoracentesis. Patchy right lower lung opacities, better evaluated on CT. Electronically Signed   By: Julian Hy M.D.   On: 01/06/2019 16:03   Dg Chest Port 1 View  Result Date: 01/05/2019 CLINICAL DATA:  Fevers EXAM: PORTABLE CHEST 1  VIEW COMPARISON:  12/31/2018 FINDINGS: Cardiac shadow is within normal limits. The left lung remains clear. Elevation the right hemidiaphragm is noted with increasing right-sided pleural effusion and basilar atelectasis. No acute bony abnormality is seen. IMPRESSION: Increasing right-sided effusion and basilar atelectasis. Electronically Signed   By: Inez Catalina M.D.   On: 01/05/2019 13:34   US Thoracentesis Asp Pleural Space W/img Guide  Result Date: 01/06/2019 INDICATION: Patient with history of recent right liver lesion biopsy and subsequent intralesional hemorrhage/hemoperitoneum; now with right pleural effusion, fevers; request made for diagnostic and therapeutic right thoracentesis. EXAM: ULTRASOUND GUIDED DIAGNOSTIC AND THERAPEUTIC RIGHT THORACENTESIS MEDICATIONS: None COMPLICATIONS: None immediate. PROCEDURE: An ultrasound guided thoracentesis was thoroughly discussed with the patient and questions answered. The benefits, risks, alternatives and complications were also discussed. The patient understands and wishes to proceed with the procedure. Written consent was obtained. Ultrasound was performed to localize and mark an adequate pocket of fluid in the right chest. The area was then prepped and draped in the normal sterile fashion. 1% Lidocaine was used for local anesthesia. Under ultrasound guidance a 6 Fr Safe-T-Centesis catheter was introduced. Thoracentesis was performed. The catheter was removed and a dressing applied. FINDINGS: A total of approximately 445 cc of bloody fluid was removed. Samples were sent to the laboratory as requested by the clinical team. IMPRESSION: Successful ultrasound guided diagnostic and therapeutic right thoracentesis yielding 445 cc of pleural fluid. CCM notified of above findings. Read by: Rowe Robert, PA-C Electronically Signed   By: Jerilynn Mages.  Shick M.D.   On: 01/06/2019 15:58    Labs:  CBC: Recent Labs    01/04/19 0320  01/05/19 0303 01/06/19 0305 01/06/19 1411  01/07/19 0247  WBC 15.8*  --  14.4* 13.3*  --  13.5*  HGB 8.4*   < > 8.5* 7.7* 8.5* 7.9*  HCT 27.2*   < > 27.7* 25.4* 27.7* 25.9*  PLT 271  --  350 393  --  454*   < > = values in this interval not displayed.    COAGS: Recent Labs    01/01/19 0528 01/02/19 1057  INR 1.20 1.21    BMP: Recent Labs    01/04/19 0320 01/05/19 0303 01/06/19 0305 01/07/19 0247  NA 138 139 138 138  K 4.1 3.6 3.2* 3.4*  CL 104 105 105 104  CO2 25 23 23 23   GLUCOSE 109* 112* 97 100*  BUN 6 5* 7 8  CALCIUM 8.2* 8.0* 7.8* 7.8*  CREATININE 0.57 0.53 0.43* 0.47  GFRNONAA >60 >60 >60 >60  GFRAA >60 >60 >60 >60    LIVER FUNCTION TESTS: Recent Labs    01/04/19 0320 01/05/19 0303 01/06/19 0305 01/07/19 0247  BILITOT 1.2 1.2 1.2 0.9  AST 81* 43* 39 26  ALT 264* 173* 115* 81*  ALKPHOS 221* 210* 175* 168*  PROT 6.2* 6.3* 5.6* 5.9*  ALBUMIN 2.6* 2.5* 2.2* 2.1*    Assessment and Plan:  Intralesional and retroperitoneum hemorrhage s/p right liver lesion biopsy 01/01/2019 by Dr. Pascal Lux. Patient's condition stable- still with stable abdominal pain and is tachycardic. No longer febrile. Hgb low but stable- 7.9 g/dL this AM. Continue to tread hgb. Tissue diagnosis- benign hepatocellular adenoma. Per Dr. Maryland Pink, plan to transfer to [facility] [place] for hepatology, transfer approved and awaiting bed. Please call IR with questions/concerns.   Electronically Signed: Earley Abide, PA-C 01/07/2019, 12:55 PM   I spent a total of 15 Minutes at the the patient's bedside AND on the patient's hospital floor or unit, greater than 50% of which was counseling/coordinating care for intralesional and retroperitoneum hemorrhage s/p liver biopsy.

## 2019-01-07 NOTE — Evaluation (Signed)
Physical Therapy Evaluation Patient Details Name: Jodi Forbes MRN: [id number] DOB: 1966-03-10 Today's Date: 01/07/2019   History of Present Illness  53 year old with no significant past medical history admitted with abdominal pain, found to have liver lesions concerning for malignancy, underwent ultrasound-guided biopsy by IR.  Postop developed bleeding and hemoperitoneum and now with pleural effusion.  Clinical Impression  Pt admitted with above diagnosis. Pt currently with functional limitations due to the deficits listed below (see PT Problem List).  Pt will benefit from skilled PT to increase their independence and safety with mobility to allow discharge to the venue listed below.  Pt presents with generalized weakness and deconditioning as she reports she has not been ambulating since admission.  Pt required supplemental oxygen with ambulation (RN notified).  Pt encouraged to ambulate with nursing staff and PT will return to check on her progress.  No f/u needs anticipated at this time.      Follow Up Recommendations No PT follow up    Equipment Recommendations  None recommended by PT    Recommendations for Other Services       Precautions / Restrictions Precautions Precaution Comments: monitor sats      Mobility  Bed Mobility Overal bed mobility: Needs Assistance Bed Mobility: Supine to Sit;Sit to Supine     Supine to sit: Supervision Sit to supine: Supervision   General bed mobility comments: supervision for lines  Transfers Overall transfer level: Needs assistance Equipment used: None Transfers: Sit to/from Stand Sit to Stand: Min guard         General transfer comment: min/guard for safety  Ambulation/Gait Ambulation/Gait assistance: Min guard Gait Distance (Feet): 160 Feet Assistive device: None Gait Pattern/deviations: Step-through pattern;Decreased stride length;Wide base of support     General Gait Details: slow but steady pace, Spo2 decreased  to 86% on room air so applied 2L O2 and SpO2 improved to 92-95%, pt reports generalized weakness  Stairs            Wheelchair Mobility    Modified Rankin (Stroke Patients Only)       Balance Overall balance assessment: No apparent balance deficits (not formally assessed)                                           Pertinent Vitals/Pain Pain Assessment: 0-10 Pain Score: 4  Pain Location: places hand to R upper quadrant Pain Descriptors / Indicators: Discomfort;Sore Pain Intervention(s): Limited activity within patient's tolerance;Monitored during session;Repositioned    Home Living Family/patient expects to be discharged to:: Private residence Living Arrangements: Other relatives;Other (Comment)(sister)   Type of Home: House       Home Layout: One level Home Equipment: None      Prior Function Level of Independence: Independent               Hand Dominance        Extremity/Trunk Assessment        Lower Extremity Assessment Lower Extremity Assessment: Generalized weakness       Communication   Communication: No difficulties  Cognition Arousal/Alertness: Awake/alert Behavior During Therapy: WFL for tasks assessed/performed Overall Cognitive Status: Within Functional Limits for tasks assessed  General Comments      Exercises     Assessment/Plan    PT Assessment Patient needs continued PT services  PT Problem List Decreased strength;Decreased mobility;Decreased activity tolerance;Cardiopulmonary status limiting activity       PT Treatment Interventions DME instruction;Functional mobility training;Gait training;Therapeutic activities;Balance training;Patient/family education;Stair training;Therapeutic exercise    PT Goals (Current goals can be found in the Care Plan section)  Acute Rehab PT Goals PT Goal Formulation: With patient Time For Goal Achievement:  01/21/19 Potential to Achieve Goals: Good    Frequency Min 3X/week   Barriers to discharge        Co-evaluation               AM-PAC PT "6 Clicks" Mobility  Outcome Measure Help needed turning from your back to your side while in a flat bed without using bedrails?: None Help needed moving from lying on your back to sitting on the side of a flat bed without using bedrails?: None Help needed moving to and from a bed to a chair (including a wheelchair)?: None Help needed standing up from a chair using your arms (e.g., wheelchair or bedside chair)?: A Little Help needed to walk in hospital room?: A Little Help needed climbing 3-5 steps with a railing? : A Little 6 Click Score: 21    End of Session Equipment Utilized During Treatment: Oxygen Activity Tolerance: Patient tolerated treatment well Patient left: in bed;with call bell/phone within reach;with family/visitor present Nurse Communication: Mobility status PT Visit Diagnosis: Difficulty in walking, not elsewhere classified (R26.2)    Time: 2244-9753 PT Time Calculation (min) (ACUTE ONLY): 17 min   Charges:   PT Evaluation $PT Eval Low Complexity: Troutman, PT, DPT Acute Rehabilitation Services Office: [phone] Pager: [phone]  Trena Platt 01/07/2019, 12:02 PM

## 2019-01-07 NOTE — Progress Notes (Signed)
PROGRESS NOTE    Jodi Forbes  WCB:[id number] DOB: October 11, 1966 DOA: 12/31/2018 PCP: Patient, No Pcp Per    Brief Narrative:  53 year old with no significant past medical history who presented to the emergency department complaining of abdominal pain that started the night prior to admission. She also report nausea vomiting restarted the day prior to admission. Evaluation in the ED showed creased transaminases alkaline phosphatase 265, leukocytosis at 13,.  Right upper quadrant ultrasound was concerning for large solid-appearing masses with concern for possible invasion into the gallbladder. CT abdomen show numerous nonspecific liver masses without extrahepatic invasion possible metastatic versus primary hepatocellular or biliary malignancy.  Surgery was consulted, patient underwent MRI of the abdomen which showed multiple liver masses could be related to metastatic disease or primary liver cancer.  Surgery recommended IR to do liver biopsy.  Pathology revealed benign adenoma. Patient underwent liver biopsy by IR January 01, 2019. Subsequently patient developed a acute blood loss anemia.  Repeated CT scan confirmed retroperitoneal hematoma and new intralesional hemorrhage in the liver.  Patient was transferred to the stepdown unit she received 2 units of packed red blood cell.  Her hemoglobin has remained stable at 8. Patient has been having fevers, and leukocytosis.  He was a started on IV Unasyn.  Blood cultures drawn 01/01/2019 has been negative.  Urine culture also negative.  HIDA scan was ordered which did not raise any concern for cholecystitis. Patient's case was discussed at tumor board and it was recommended that she be transferred to a tertiary center where they have hepatology.  [facility] [place]  Accepted in transfer.  Waiting on a bed.   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 Hepatitis panel negative.  CT chest multiples small pulmonary nodule.  Patient was seen by oncology and radiation oncology.  However pathology suggests hepatocellular adenoma.  Does not need any oncologic treatment at this time.   Patient's case was discussed at tumor board and it was recommended that she be transferred to a center where they have hepatology for consideration of transplant.  Radiation oncology discussed this with providers at [facility] [place].  She will be transferred there when bed is available. Patient's pain has improved.  She is still requiring pain medications intravenously.  LFTs are improving as well.    Leukocytosis/fever Most likely due to peritoneal hemorrhage. blood cultures; no growth to date. influenza negative.  Patient was initially placed on Unasyn.  Transitioned over to vancomycin and cefepime.  Chest x-ray showed atelectasis and effusion.  Pulmonology was consulted.  MRSA PCR is negative.  Vancomycin could be discontinued.  Continue cefepime.  Fever appears to be subsiding.  HIDA scan did not show any cholecystitis. Procalcitonin level 0.66.  Anemia;acute blood loss anemia; intralesional hemorrhage and retroperitoneum.  Also Iron deficiency anemia.  Patient's hemoglobin dropped to 6.9.  Improved with 2 units of PRBCs.  Hemoglobin low but stable.  CT scan was repeated which shows improvement in the hemorrhage.  Continue to monitor periodically.  Transfuse as needed.  Atelectasis/right-sided pleural effusion Incentive spirometry.  Continue antibiotics.  Effusion is probably related to the hemorrhage.  Underwent thoracentesis yesterday with removal of 445 cc of bloody fluid.  Patient symptoms improved following this.  Surgeon.  Hypokalemia Potassium improved but still low.  Replete further.  Magnesium is 2.1.  Cholelithiasis No clear evidence for cholecystitis on ultrasound.  HIDA  scan also did not show any evidence for cholecystitis.  General surgery has signed off.   DVT prophylaxis: SCDs Code Status: Full code Family Communication: Mother at bedside.  Disposition Plan: Waiting on [facility] to [facility] [place].  Stop vancomycin.  Will reevaluate later today.  She could be candidate for transfer to telemetry floor later today.  Consultants:   Surgery  IR  Gastroenterology  Pulmonology   Procedures:  Liver biopsy  Antimicrobials:  Was on Unasyn.  Now on vancomycin and cefepime.  Vancomycin will be discontinued on 1/9.  Subjective: Patient states that pain still around 4 out of 10.  She got pain medications at 5:30 AM this morning.  No nausea or vomiting.  Urinating well.  Objective: Vitals:   01/07/19 0334 01/07/19 0400 01/07/19 0600 01/07/19 0800  BP:  (!) 123/59 135/64   Pulse:  95 98   Resp:  17 20   Temp: 98.1 F (36.7 C)   98.9 F (37.2 C)  TempSrc: Oral   Oral  SpO2:  99% 98%   Weight:      Height:        Intake/Output Summary (Last 24 hours) at 01/07/2019 0831 Last data filed at 01/07/2019 0600 Gross per 24 hour  Intake 874.49 ml  Output 1000 ml  Net -125.51 ml   Filed Weights   12/31/18 1555 01/02/19 1030  Weight: 84.8 kg 87.9 kg    Examination:  General appearance: Awake alert.  In no distress Resp: Mildly tachypneic at rest.  Improved air entry at the bases without any wheezing rales or rhonchi.  Few crackles at the right side.   Cardio: S1-S2 is mildly tachycardic today.  Heart rate has improved.  No S3-S4.  No rubs murmurs or bruit  GI: Abdomen is soft.  Remains tender on the right side without any rebound rigidity.  Mild guarding.  Bowel sounds present.  No obvious masses organomegaly Extremities: No significant edema Neurologic: Alert and oriented x3.  No focal neurological deficits   Data Reviewed: I have personally reviewed following labs and imaging studies  CBC: Recent Labs  Lab 01/01/19 0528 01/02/19 0525   01/04/19 0320 01/04/19 1951 01/05/19 0303 01/06/19 0305 01/06/19 1411 01/07/19 0247  WBC 13.4* 14.9*  --  15.8*  --  14.4* 13.3*  --  13.5*  NEUTROABS 11.3*  --   --   --   --   --   --   --   --   HGB 8.4* 6.9*   < > 8.4* 8.4* 8.5* 7.7* 8.5* 7.9*  HCT 27.7* 23.0*   < > 27.2* 27.0* 27.7* 25.4* 27.7* 25.9*  MCV 92.3 93.9  --  95.4  --  94.2 93.4  --  92.2  PLT 400 350  --  271  --  350 393  --  454*   < > = values in this interval not displayed.   Basic Metabolic Panel: Recent Labs  Lab 01/03/19 0622 01/04/19 0320 01/05/19 0303 01/06/19 0305 01/07/19 0247  NA 138 138 139 138 138  K 3.5 4.1 3.6 3.2* 3.4*  CL 106 104 105 105 104  CO2 22 25 23 23 23   GLUCOSE 101* 109* 112* 97 100*  BUN 6 6 5* 7 8  CREATININE 0.48 0.57 0.53 0.43* 0.47  CALCIUM 8.0* 8.2* 8.0* 7.8* 7.8*  MG  --   --   --  2.1 2.1   GFR: Estimated Creatinine Clearance: 89.3 mL/min (by C-G formula based on SCr  of 0.47 mg/dL). Liver Function Tests: Recent Labs  Lab 01/03/19 0622 01/04/19 0320 01/05/19 0303 01/06/19 0305 01/07/19 0247  AST 198* 81* 43* 39 26  ALT 383* 264* 173* 115* 81*  ALKPHOS 223* 221* 210* 175* 168*  BILITOT 1.5* 1.2 1.2 1.2 0.9  PROT 5.7* 6.2* 6.3* 5.6* 5.9*  ALBUMIN 2.6* 2.6* 2.5* 2.2* 2.1*   Recent Labs  Lab 12/31/18 1626  LIPASE 25   Coagulation Profile: Recent Labs  Lab 01/01/19 0528 01/02/19 1057  INR 1.20 1.21   Cardiac Enzymes: Recent Labs  Lab 12/31/18 1626 12/31/18 2006  TROPONINI <0.03 <0.03   CBG: Recent Labs  Lab 01/03/19 0833 01/04/19 0750 01/05/19 0743 01/06/19 0801 01/07/19 0757  GLUCAP 107* 98 87 89 83   Sepsis Labs: Recent Labs  Lab 01/05/19 1701 01/06/19 0305 01/07/19 0247  PROCALCITON 0.68 0.66 0.47    Recent Results (from the past 240 hour(s))  Culture, blood (routine x 2)     Status: None   Collection Time: 01/01/19 11:09 AM  Result Value Ref Range Status   Specimen Description   Final    BLOOD RIGHT HAND Performed at [facility], [place] [address], [place], [place] [address]    Special Requests   Final    BOTTLES DRAWN AEROBIC AND ANAEROBIC Blood Culture adequate volume Performed at [place] [address], [place], [place] [address]    Culture   Final    NO GROWTH 5 DAYS Performed at [facility], [place] [address] [place], [place] [address]    Report Status 01/06/2019 FINAL  Final  Culture, blood (routine x 2)     Status: None   Collection Time: 01/01/19 11:10 AM  Result Value Ref Range Status   Specimen Description   Final    BLOOD LEFT ARM Performed at [facility] [address] [place], [place] [address]    Special Requests   Final    BOTTLES DRAWN AEROBIC AND ANAEROBIC Blood Culture adequate volume Performed at [place] [address] [place], [place] [address]    Culture   Final    NO GROWTH 5 DAYS Performed at [facility], [place] [address], [place], [place] [address]    Report Status 01/06/2019 FINAL  Final  Urine Culture     Status: Abnormal   Collection Time: 01/01/19  2:40 PM  Result Value Ref Range Status   Specimen Description   Final    URINE, CLEAN CATCH Performed at [facility], [place] [address] [place], [place] [address]    Special Requests   Final    NONE Performed at [facility], [place] [address] [place], [place] [address]    Culture (A)  Final    <10,000 COLONIES/mL INSIGNIFICANT GROWTH Performed at [facility] [address] [place], [place] [address]    Report Status 01/02/2019 FINAL  Final  MRSA PCR Screening     Status: None   Collection Time: 01/02/19 10:30 AM  Result Value Ref Range Status   MRSA by PCR NEGATIVE NEGATIVE Final    Comment:        The GeneXpert MRSA Assay (FDA approved for NASAL specimens only), is one component of a comprehensive MRSA colonization surveillance program. It is not intended to diagnose  MRSA infection nor to guide or monitor treatment for MRSA infections. Performed at [facility], [place] [address] [place],  [place] [address]   Urine Culture     Status: None   Collection Time: 01/04/19  7:32 AM  Result Value Ref Range Status   Specimen Description   Final    URINE, RANDOM Performed at [place] [address], [place], [place] [address]    Special Requests   Final    NONE Performed at [facility], [place] [address] [place], [place] [address]    Culture   Final    NO GROWTH Performed at [facility], [place] [address], [place], [place] [address]    Report Status 01/05/2019 FINAL  Final  Culture, body fluid-bottle     Status: None (Preliminary result)   Collection Time: 01/06/19  3:51 PM  Result Value Ref Range Status   Specimen Description FLUID PLEURAL RIGHT  Final   Special Requests BOTTLES DRAWN AEROBIC AND ANAEROBIC  Final   Culture   Final    NO GROWTH < 12 HOURS Performed at [facility], [place] [address], [place], [place] [address]    Report Status PENDING  Incomplete  Gram stain     Status: None (Preliminary result)   Collection Time: 01/06/19  3:51 PM  Result Value Ref Range Status   Specimen Description FLUID PLEURAL RIGHT  Final   Special Requests NONE  Final   Gram Stain   Final    MODERATE WBC PRESENT,BOTH PMN AND MONONUCLEAR NO ORGANISMS SEEN Performed at [facility], [address], [place], [place] [address]    Report Status PENDING  Incomplete         Radiology Studies: Dg Chest 2 View  Result Date: 01/06/2019 CLINICAL DATA:  Pleural effusion. EXAM: CHEST - 2 VIEW COMPARISON:  Chest x-rays dated 01/05/2019 and 12/31/2018 and chest CT dated 01/05/2019 FINDINGS: The moderate right effusion has slightly increased since 01/05/2019. Heart size and vascularity remain normal. Left lung is clear. Bones are normal. IMPRESSION: Slight increase in the moderate right  effusion. Electronically Signed   By: Lorriane Shire M.D.   On: 01/06/2019 09:07   Ct Chest W Contrast  Result Date: 01/05/2019 CLINICAL DATA:  Upper abdominal pain for 1.5 weeks radiating into the back. The patient underwent biopsy of liver mass 01/01/2019. Pathology showed a benign adenoma. EXAM: CT CHEST, ABDOMEN, AND PELVIS WITH CONTRAST TECHNIQUE: Multidetector CT imaging of the chest, abdomen and pelvis was performed following the standard protocol during bolus administration of intravenous contrast. CONTRAST:  100 mL ISOVUE-300 IOPAMIDOL (ISOVUE-300) INJECTION 61% COMPARISON:  CT abdomen and 01/02/2019 and 12/31/2018. CT chest 01/01/2019. FINDINGS: CT CHEST FINDINGS Cardiovascular: No significant vascular findings. Normal heart size. No pericardial effusion. Mediastinum/Nodes: No enlarged mediastinal, hilar, or axillary lymph nodes. Thyroid gland, trachea, and esophagus demonstrate no significant findings. Lungs/Pleura: Small to moderate right pleural effusion. Trace left pleural effusion. Compressive atelectasis of the right lower lobe. Minimal dependent atelectasis on the left is noted. Tiny bilateral pulmonary nodule seen on prior chest CT are unchanged. Musculoskeletal: Negative. CT ABDOMEN PELVIS FINDINGS Hepatobiliary: Multiple hepatic masses are identified as seen on comparison examination. 2-3 tiny locules of gas are noted a lesion in the posterior right hepatic lobe, decreased since the most recent exam. There is a small amount of perihepatic hemorrhage along the posterior right hepatic lobe. Gallstones without evidence of cholecystitis noted. Pancreas: Unremarkable. No pancreatic ductal dilatation or surrounding inflammatory changes. Spleen: Normal in size without focal abnormality. 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  The kidneys are otherwise unremarkable. The adrenal glands appear normal. Urinary bladder appears normal. Stomach/Bowel: Stomach is within  normal limits. Appendix appears normal. No evidence of bowel wall thickening, distention, or inflammatory changes. Vascular/Lymphatic: No significant vascular findings are present. No enlarged abdominal or pelvic lymph nodes. Reproductive: Calcified uterine fibroid noted.  No adnexal mass. Other: Hemoperitoneum recent in the pelvis is decreased in volume examination since the most recent exam. Musculoskeletal: Negative. IMPRESSION: No new abnormality since the most recent study. Multiple hepatic masses consistent with known benign adenomas. Small locules of gas in a lesion in the right hepatic lobe are seen and decreased since the most recent examination consistent with resolving post biopsy change. Decreased hemoperitoneum. Small to moderate right pleural effusion with associated compressive atelectasis of the right lower lobe. Minimal dependent atelectasis left lower lobe noted. No change in 2-3 tiny pulmonary nodules, nonspecific. Gallstones without evidence of cholecystitis. Electronically Signed   By: Inge Rise M.D.   On: 01/05/2019 19:49   Nm Hepatobiliary Liver Func  Result Date: 01/06/2019 CLINICAL DATA:  Pain with nausea and vomiting EXAM: NUCLEAR MEDICINE HEPATOBILIARY IMAGING TECHNIQUE: Sequential images of the abdomen were obtained out to 60 minutes following intravenous administration of radiopharmaceutical. RADIOPHARMACEUTICALS:  5.0 mCi Tc-78m  Choletec IV COMPARISON:  None. FINDINGS: Prompt uptake and biliary excretion of activity by the liver is seen. There is abnormal radiotracer uptake throughout portions of the right lobe of the liver. Gallbladder activity is visualized, consistent with patency of cystic duct. Biliary activity passes into small bowel, consistent with patent common bile duct. No appreciable ectopic radiotracer uptake. IMPRESSION: Areas of abnormal radiotracer uptake in the liver of uncertain etiology. Patient is scheduled for abdominal CT which should be helpful for  further assessment of this abnormal radiotracer uptake in the liver. Cystic and common bile ducts are patent as is evidenced by visualization of gallbladder and small bowel. Electronically Signed   By: Lowella Grip III M.D.   On: 01/06/2019 10:49   Ct Abdomen Pelvis W Contrast  Result Date: 01/05/2019 CLINICAL DATA:  Upper abdominal pain for 1.5 weeks radiating into the back. The patient underwent biopsy of liver mass 01/01/2019. Pathology showed a benign adenoma. EXAM: CT CHEST, ABDOMEN, AND PELVIS WITH CONTRAST TECHNIQUE: Multidetector CT imaging of the chest, abdomen and pelvis was performed following the standard protocol during bolus administration of intravenous contrast. CONTRAST:  100 mL ISOVUE-300 IOPAMIDOL (ISOVUE-300) INJECTION 61% COMPARISON:  CT abdomen and 01/02/2019 and 12/31/2018. CT chest 01/01/2019. FINDINGS: CT CHEST FINDINGS Cardiovascular: No significant vascular findings. Normal heart size. No pericardial effusion. Mediastinum/Nodes: No enlarged mediastinal, hilar, or axillary lymph nodes. Thyroid gland, trachea, and esophagus demonstrate no significant findings. Lungs/Pleura: Small to moderate right pleural effusion. Trace left pleural effusion. Compressive atelectasis of the right lower lobe. Minimal dependent atelectasis on the left is noted. Tiny bilateral pulmonary nodule seen on prior chest CT are unchanged. Musculoskeletal: Negative. CT ABDOMEN PELVIS FINDINGS Hepatobiliary: Multiple hepatic masses are identified as seen on comparison examination. 2-3 tiny locules of gas are noted a lesion in the posterior right hepatic lobe, decreased since the most recent exam. There is a small amount of perihepatic hemorrhage along the posterior right hepatic lobe. Gallstones without evidence of cholecystitis noted. Pancreas: Unremarkable. No pancreatic ductal dilatation or surrounding inflammatory changes. Spleen: Normal in size without focal abnormality. Adrenals/Urinary Tract: Tiny  angiomyolipoma in the left kidney is unchanged. The kidneys are otherwise unremarkable. The adrenal glands appear normal. Urinary bladder appears normal. Stomach/Bowel: Stomach  is within normal limits. Appendix appears normal. No evidence of bowel wall thickening, distention, or inflammatory changes. Vascular/Lymphatic: No significant vascular findings are present. No enlarged abdominal or pelvic lymph nodes. Reproductive: Calcified uterine fibroid noted.  No adnexal mass. Other: Hemoperitoneum recent in the pelvis is decreased in volume examination since the most recent exam. Musculoskeletal: Negative. IMPRESSION: No new abnormality since the most recent study. Multiple hepatic masses consistent with known benign adenomas. Small locules of gas in a lesion in the right hepatic lobe are seen and decreased since the most recent examination consistent with resolving post biopsy change. Decreased hemoperitoneum. Small to moderate right pleural effusion with associated compressive atelectasis of the right lower lobe. Minimal dependent atelectasis left lower lobe noted. No change in 2-3 tiny pulmonary nodules, nonspecific. Gallstones without evidence of cholecystitis. Electronically Signed   By: Inge Rise M.D.   On: 01/05/2019 19:49   Dg Chest Port 1 View  Result Date: 01/06/2019 CLINICAL DATA:  Status post right thoracentesis EXAM: PORTABLE CHEST 1 VIEW COMPARISON:  Chest radiographs dated 01/06/2019. CT chest dated 01/05/2019. FINDINGS: Volume loss in the right hemithorax. Patchy right lower lung opacities. Left lung is clear. No pneumothorax is seen. The heart is normal in size. IMPRESSION: No pneumothorax status post right thoracentesis. Patchy right lower lung opacities, better evaluated on CT. Electronically Signed   By: Julian Hy M.D.   On: 01/06/2019 16:03   Dg Chest Port 1 View  Result Date: 01/05/2019 CLINICAL DATA:  Fevers EXAM: PORTABLE CHEST 1 VIEW COMPARISON:  12/31/2018 FINDINGS:  Cardiac shadow is within normal limits. The left lung remains clear. Elevation the right hemidiaphragm is noted with increasing right-sided pleural effusion and basilar atelectasis. No acute bony abnormality is seen. IMPRESSION: Increasing right-sided effusion and basilar atelectasis. Electronically Signed   By: Inez Catalina M.D.   On: 01/05/2019 13:34   US Thoracentesis Asp Pleural Space W/img Guide  Result Date: 01/06/2019 INDICATION: Patient with history of recent right liver lesion biopsy and subsequent intralesional hemorrhage/hemoperitoneum; now with right pleural effusion, fevers; request made for diagnostic and therapeutic right thoracentesis. EXAM: ULTRASOUND GUIDED DIAGNOSTIC AND THERAPEUTIC RIGHT THORACENTESIS MEDICATIONS: None COMPLICATIONS: None immediate. PROCEDURE: An ultrasound guided thoracentesis was thoroughly discussed with the patient and questions answered. The benefits, risks, alternatives and complications were also discussed. The patient understands and wishes to proceed with the procedure. Written consent was obtained. Ultrasound was performed to localize and mark an adequate pocket of fluid in the right chest. The area was then prepped and draped in the normal sterile fashion. 1% Lidocaine was used for local anesthesia. Under ultrasound guidance a 6 Fr Safe-T-Centesis catheter was introduced. Thoracentesis was performed. The catheter was removed and a dressing applied. FINDINGS: A total of approximately 445 cc of bloody fluid was removed. Samples were sent to the laboratory as requested by the clinical team. IMPRESSION: Successful ultrasound guided diagnostic and therapeutic right thoracentesis yielding 445 cc of pleural fluid. CCM notified of above findings. Read by: Rowe Robert, PA-C Electronically Signed   By: Jerilynn Mages.  Shick M.D.   On: 01/06/2019 15:58        Scheduled Meds: . Influenza vac split quadrivalent PF  0.5 mL Intramuscular Tomorrow-1000  . mouth rinse  15 mL Mouth  Rinse BID  . pantoprazole (PROTONIX) IV  40 mg Intravenous Q12H  . polyethylene glycol  17 g Oral BID  . senna-docusate  1 tablet Oral BID   Continuous Infusions: . sodium chloride 50 mL/hr at 01/06/19 0300  .  ceFEPime (MAXIPIME) IV Stopped (01/07/19 0303)  . vancomycin Stopped (01/07/19 0016)     LOS: 3 days     [name], MD [facility] Pager [phone]  If 7PM-7AM, please contact night-coverage [email or web address] Password [facility] 01/07/2019, 8:31 AM

## 2019-01-07 NOTE — Progress Notes (Signed)
NAME:  Jodi Forbes, MRN:  [id number], DOB:  1966-07-26, LOS: 3 ADMISSION DATE:  12/31/2018, CONSULTATION DATE:  01/05/19 REFERRING MD:  Rudolpho Sevin MD, CHIEF COMPLAINT: Pleural effusion  Brief History   53 year old with no significant past medical history admitted with abdominal pain, found to have liver lesions concerning for malignancy, underwent ultrasound-guided biopsy by IR.  Postop developed bleeding and hemoperitoneum.  PCCM consulted on 1/7 for fevers and chest x-ray showing right pleural effusion.  Past Medical History  None  Significant Hospital Events   1/2- Admit 1/3-biopsy of liver mass 1/4-drop in hemoglobin, hemoperitoneum, developed fevers 1/7- PCCM consulted for persistent fevers, right pleural effusion.  Consults:  PCCM Surgery IR GI  Procedures:  1/3- Liver biopsy 1/8> Thoracentesis with Radiology  Significant Diagnostic Tests:  CT abdomen pelvis 1/2- numerous nonspecific liver mass measuring 9.2 cm, 8 mm right lower lobe pulmonary nodule.  Multiple uterine fibroids.  CT chest 1/3- tiny bilateral pulmonary nodules.  Known hepatic lesions  CT abdomen pelvis 1/4-intralesional hemorrhage, new small-moderate hemoperitoneum, slightly increased bilateral lower lobe atelectasis.  Chest x-ray 1/7- right hemidiaphragm elevation with pleural effusion. I have reviewed the images personally.  CT c/a/p 1/7> no cardiomegaly. Small/Moderate R pleural effusion with trace L pleural effusion. Bilateral lower lobe atelectasis R>L. Multiple hepatic masses. R posterior lobe hepatic lesion. Small perihepatic hemorrhage. Gallstones without cholecystitis. No evidence of bowel thickening, inflammatory changes. Normal appendix. Interval decrease in volume of hemoperitoneum   CXR 1/8> increase in size of R pleural effusion  CXR 1/8> post-thoracentesis-- no pneumothorax. RLL opacity  Micro Data:  Blood cultures 1/3- no growth Urine culture 1/3 < 10K colonies Urine culture  1/6- no growth Pleural fluid cx 1/8>>  Antimicrobials:  Unasyn 1/3- 1/7 Cefepime 1/7>> Vanc 1/7>>  Interim history/subjective:  Patient s/p thoracentesis and reports feeling "pretty good" with specific report of "less pressure" when taking deep breaths.   Objective   Blood pressure 135/64, pulse 98, temperature 98.1 F (36.7 C), temperature source Oral, resp. rate 20, height 5\' 4"  (1.626 m), weight 87.9 kg, SpO2 98 %.        Intake/Output Summary (Last 24 hours) at 01/07/2019 0755 Last data filed at 01/07/2019 0600 Gross per 24 hour  Intake 989.05 ml  Output 1000 ml  Net -10.95 ml   Filed Weights   12/31/18 1555 01/02/19 1030  Weight: 84.8 kg 87.9 kg    Examination: Gen:      WDWN adult female in NAD HEENT:  /AT, PERRL, 2LNC, mmm  Lungs:    Diminished RLL lung sounds. No crackles, wheezing  CV:         Sinus tachycardia. No r/g/m. Radial pulses 2+ bilaterally  Abd:      Normoactive bowel sounds. RUQ tenderness without rebound tenderness.  Ext:    No pedal edema. No obvious joint deformity. Capillary refill < 3 seconds BUE BLE Skin:      Clean, dry, warm, intact Neuro: AAOx4, following commands   Resolved Hospital Problem list     Assessment & Plan:    Hemothorax, R sided -s/p thoracentesis 1/8 -bloody fluid sample P -Follow up with outstanding cytology results -Outpatient CT follow up for RLL opacity    Thank you for consulting PCCM. PCCM will sign off at this time. Please let us know if you require any further assistance.   Labs   CBC: Recent Labs  Lab 01/01/19 2297 01/02/19 9892  01/04/19 0320 01/04/19 1951 01/05/19 0303 01/06/19 1194 01/06/19 1411 01/07/19 1740  WBC 13.4* 14.9*  --  15.8*  --  14.4* 13.3*  --  13.5*  NEUTROABS 11.3*  --   --   --   --   --   --   --   --   HGB 8.4* 6.9*   < > 8.4* 8.4* 8.5* 7.7* 8.5* 7.9*  HCT 27.7* 23.0*   < > 27.2* 27.0* 27.7* 25.4* 27.7* 25.9*  MCV 92.3 93.9  --  95.4  --  94.2 93.4  --  92.2  PLT 400 350   --  271  --  350 393  --  454*   < > = values in this interval not displayed.    Basic Metabolic Panel: Recent Labs  Lab 01/03/19 0622 01/04/19 0320 01/05/19 0303 01/06/19 0305 01/07/19 0247  NA 138 138 139 138 138  K 3.5 4.1 3.6 3.2* 3.4*  CL 106 104 105 105 104  CO2 22 25 23 23 23   GLUCOSE 101* 109* 112* 97 100*  BUN 6 6 5* 7 8  CREATININE 0.48 0.57 0.53 0.43* 0.47  CALCIUM 8.0* 8.2* 8.0* 7.8* 7.8*  MG  --   --   --  2.1 2.1   GFR: Estimated Creatinine Clearance: 89.3 mL/min (by C-G formula based on SCr of 0.47 mg/dL). Recent Labs  Lab 01/04/19 0320 01/05/19 0303 01/05/19 1701 01/06/19 0305 01/07/19 0247  PROCALCITON  --   --  0.68 0.66 0.47  WBC 15.8* 14.4*  --  13.3* 13.5*    Liver Function Tests: Recent Labs  Lab 01/03/19 0622 01/04/19 0320 01/05/19 0303 01/06/19 0305 01/07/19 0247  AST 198* 81* 43* 39 26  ALT 383* 264* 173* 115* 81*  ALKPHOS 223* 221* 210* 175* 168*  BILITOT 1.5* 1.2 1.2 1.2 0.9  PROT 5.7* 6.2* 6.3* 5.6* 5.9*  ALBUMIN 2.6* 2.6* 2.5* 2.2* 2.1*   Recent Labs  Lab 12/31/18 1626  LIPASE 25   No results for input(s): AMMONIA in the last 168 hours.  ABG No results found for: PHART, PCO2ART, PO2ART, HCO3, TCO2, ACIDBASEDEF, O2SAT   Coagulation Profile: Recent Labs  Lab 01/01/19 0528 01/02/19 1057  INR 1.20 1.21    Cardiac Enzymes: Recent Labs  Lab 12/31/18 1626 12/31/18 2006  TROPONINI <0.03 <0.03    HbA1C: No results found for: HGBA1C  CBG: Recent Labs  Lab 01/02/19 0731 01/03/19 0833 01/04/19 0750 01/05/19 0743 01/06/19 0801  GLUCAP 112* 107* 98 87 89    Eliseo Gum MSN, AGACNP-BC [facility] 01/07/2019, 7:55 AM

## 2019-01-08 ENCOUNTER — Ambulatory Visit: Payer: Self-pay

## 2019-01-08 LAB — CBC
HCT: 26.2 % — ABNORMAL LOW (ref 36.0–46.0)
Hemoglobin: 7.9 g/dL — ABNORMAL LOW (ref 12.0–15.0)
MCH: 28 pg (ref 26.0–34.0)
MCHC: 30.2 g/dL (ref 30.0–36.0)
MCV: 92.9 fL (ref 80.0–100.0)
Platelets: 539 10*3/uL — ABNORMAL HIGH (ref 150–400)
RBC: 2.82 MIL/uL — ABNORMAL LOW (ref 3.87–5.11)
RDW: 13.8 % (ref 11.5–15.5)
WBC: 14.8 10*3/uL — ABNORMAL HIGH (ref 4.0–10.5)
nRBC: 0 % (ref 0.0–0.2)

## 2019-01-08 LAB — BASIC METABOLIC PANEL
Anion gap: 12 (ref 5–15)
BUN: 8 mg/dL (ref 6–20)
CALCIUM: 8 mg/dL — AB (ref 8.9–10.3)
CO2: 24 mmol/L (ref 22–32)
CREATININE: 0.42 mg/dL — AB (ref 0.44–1.00)
Chloride: 103 mmol/L (ref 98–111)
GFR calc Af Amer: >60 mL/min (ref >60)
GFR calc non Af Amer: >60 mL/min (ref >60)
Glucose, Bld: 90 mg/dL (ref 70–99)
Potassium: 3.5 mmol/L (ref 3.5–5.1)
SODIUM: 139 mmol/L (ref 135–145)

## 2019-01-08 LAB — ALPHA-1 ANTITRYPSIN PHENOTYPE: A1 ANTITRYPSIN SER: 433 mg/dL — AB (ref 101–187)

## 2019-01-08 LAB — GLUCOSE, CAPILLARY: Glucose-Capillary: 84 mg/dL (ref 70–99)

## 2019-01-08 MED ORDER — SENNOSIDES-DOCUSATE SODIUM 8.6-50 MG PO TABS: 1.0000 | ORAL_TABLET | Freq: Every day | ORAL | Status: DC

## 2019-01-08 MED ORDER — POTASSIUM CHLORIDE CRYS ER 20 MEQ PO TBCR
40.0000 meq | EXTENDED_RELEASE_TABLET | Freq: Once | ORAL | Status: AC
  Administered 2019-01-08: 40 meq via ORAL
  Filled 2019-01-08: qty 2

## 2019-01-08 MED ORDER — POLYETHYLENE GLYCOL 3350 17 G PO PACK: 17.0000 g | PACK | Freq: Every day | ORAL | Status: DC | PRN

## 2019-01-08 NOTE — Progress Notes (Signed)
Pharmacy Antibiotic Note  Tonita Bills is a 53 y.o. female admitted on 12/31/2018 with abdominal pain, liver masses seen on MRI.  R/o primary liver cancer vs metastasis vs infection.  CXR with atelectasis and effusion.   01/08/2019 Day #4 Cefepime  T max 100.6  WBC 14.8  SCr 0.42, CrCl ~90 ml/min  PCT 0.66 > 0.47  Plan:  Continue Cefepime 1 gm IV q8h  Renal function remains stable, no dose adjustments needed - Pharmacy will sign off  Height: 5\' 4"  (162.6 cm) Weight: 196 lb 13.9 oz (89.3 kg) IBW/kg (Calculated) : 54.7  Temp (24hrs), Avg:99.4 F (37.4 C), Min:98.8 F (37.1 C), Max:100.6 F (38.1 C)  Recent Labs  Lab 01/04/19 0320 01/05/19 0303 01/06/19 0305 01/07/19 0247 01/08/19 0609  WBC 15.8* 14.4* 13.3* 13.5* 14.8*  CREATININE 0.57 0.53 0.43* 0.47 0.42*    Estimated Creatinine Clearance: 90 mL/min (A) (by C-G formula based on SCr of 0.42 mg/dL (L)).    No Known Allergies  Antimicrobials this admission: 1/3 Unasyn >> 1/7 1/7 Vanc>> 1/9 1/7 cefepime>>   Dose adjustments this admission:    Microbiology results: 1/3 BCx: NGF 1/3 UCx: < 10K growth-final 1/3 Liver bx:  1/3 HIV: non-reactive  1/3 Hep panel: neg 1/4 MRSA PCR: neg 1/4 Flu PCR: neg/neg 1/6 UCx: NGF 1/8 Pleural fluid: no org seen, NGTD  Peggyann Juba, PharmD, BCPS Pager: [phone] 01/08/2019 7:34 AM

## 2019-01-08 NOTE — Discharge Summary (Signed)
[facility]  Physician Discharge Summary   Patient ID: Jodi Forbes MRN: [id number] DOB/AGE: 1966-03-23 53 y.o.  Admit date: 12/31/2018 Discharge date: 01/08/2019  PCP: Patient, No Pcp Per  PATIENT BEING TRANSFERRED TO [facility] IN [place]  DISCHARGE DIAGNOSES:  Hepatic adenoma Multiple liver masses Possible healthcare associated pneumonia Acute blood loss anemia Intralesional hepatic hemorrhage and retroperitoneal hemorrhage Cholelithiasis   INITIAL HISTORY: 53 year old with no significant past medical history who presented to the emergency department complaining of abdominal pain that started the night prior to admission. She also report nausea vomiting restarted the day prior to admission. Evaluation in the ED showed creased transaminases alkaline phosphatase 265, leukocytosis at 13,.  Right upper quadrant ultrasound was concerning for large solid-appearing masses with concern for possible invasion into the gallbladder. CT abdomen show numerous nonspecific liver masses without extrahepatic invasion possible metastatic versus primary hepatocellular or biliary malignancy.  Surgery was consulted, patient underwent MRI of the abdomen which showed multiple liver masses could be related to metastatic disease or primary liver cancer.  Surgery recommended IR to do liver biopsy.  Pathology revealed benign adenoma. Patient underwent liver biopsy by IR January 01, 2019. Subsequently patient developed a acute blood loss anemia.  Repeated CT scan confirmed retroperitoneal hematoma and new intralesional hemorrhage in the liver.  Patient was transferred to the stepdown unit she received 2 units of packed red blood cell.  Her hemoglobin has remained stable at 8. Patient has been having fevers, and leukocytosis.  He was a started on IV Unasyn.  Blood cultures drawn 01/01/2019 has been negative.  Urine culture also negative.  HIDA scan was ordered which did not raise any concern  for cholecystitis. Patient's case was discussed at tumor board and it was recommended that she be transferred to a tertiary center where they have hepatology.  [facility] [place]  Accepted in transfer.  Waiting on a bed.   Consultants:   Surgery  IR  Gastroenterology  Pulmonology  Procedures:  Liver biopsy  Right thoracentesis   HOSPITAL COURSE:   Liver masses/transaminitis/biopsy positive for hepatocellular adenoma Patient underwent liver biopsy 01-01-2019 Alpha-fetoprotein level and CEA level negative HIV negative. Hepatitis panel negative.  CT chest multiples small pulmonary nodule.  Pathology suggests hepatocellular adenoma. Patient was seen by oncology and radiation oncology.    Does not need any oncologic treatment at this time.   Patient's case was discussed at tumor board and it was recommended that she be transferred to a center where they have hepatology for consideration of transplant.  Radiation oncology discussed this with providers [facility] [place].  She will be transferred there when bed is available. Patient continues to have periodic pain requiring IV pain medications.  Has had multiple bowel movements.  Dose of laxatives was decreased..  Leukocytosis/fever/concern for pneumonia Most likely due to peritoneal hemorrhage. blood cultures; no growth to date. influenza negative.  Patient was initially placed on Unasyn.  Transitioned over to vancomycin and cefepime.  Chest x-ray showed atelectasis and effusion.  Concern for pneumonia. Pulmonology was consulted.  MRSA PCR is negative.    Vancomycin was discontinued.  Cefepime was continued.  Still with low-grade fevers.  HIDA scan did not show any cholecystitis. Procalcitonin level 0.66.  Anemia;acute blood loss anemia; intralesional hemorrhage and retroperitoneal hemorrhage.  Also Iron deficiency anemia.  Patient's hemoglobin dropped to 6.9.  Improved with 2 units of PRBCs.   Hemoglobin low but stable.  CT scan was repeated which shows improvement  in the hemorrhage.    Transfuse as needed.  Atelectasis/right-sided pleural effusion Incentive spirometry.  Continue antibiotics.  Effusion is probably related to the hemorrhage.  Underwent thoracentesis with removal of 445 cc of bloody fluid.  Patient symptoms improved following this.  Wean down oxygen as tolerated.  Pleural fluid cultures are negative so far.  Hypokalemia Potassium level improved.  Additional dose was given this morning.  Cholelithiasis No clear evidence for cholecystitis on ultrasound.  HIDA scan also did not show any evidence for cholecystitis.  General surgery has signed off.  Patient remains stable for transport to [facility] in [place].    PERTINENT LABS:  The results of significant diagnostics from this hospitalization (including imaging, microbiology, ancillary and laboratory) are listed below for reference.    Microbiology: Recent Results (from the past 240 hour(s))  Culture, blood (routine x 2)     Status: None   Collection Time: 01/01/19 11:09 AM  Result Value Ref Range Status   Specimen Description   Final    BLOOD RIGHT HAND Performed at [facility] [address] [place], [place] [address]    Special Requests   Final    BOTTLES DRAWN AEROBIC AND ANAEROBIC Blood Culture adequate volume Performed at [facility] [address], [place], [place] [address]    Culture   Final    NO GROWTH 5 DAYS Performed at [facility], [place] [address], [place], [place] [address]    Report Status 01/06/2019 FINAL  Final  Culture, blood (routine x 2)     Status: None   Collection Time: 01/01/19 11:10 AM  Result Value Ref Range Status   Specimen Description   Final    BLOOD LEFT ARM Performed at [facility] [address], [place], [place] [address]    Special Requests   Final    BOTTLES DRAWN AEROBIC  AND ANAEROBIC Blood Culture adequate volume Performed at [facility] [address] [place], [place] [address]    Culture   Final    NO GROWTH 5 DAYS Performed at [facility], [place] [address], [place], [place] [address]    Report Status 01/06/2019 FINAL  Final  Urine Culture     Status: Abnormal   Collection Time: 01/01/19  2:40 PM  Result Value Ref Range Status   Specimen Description   Final    URINE, CLEAN CATCH Performed at [facility], [place] [address], [place], [place] [address]    Special Requests   Final    NONE Performed at [facility], [place] [address] [place], [place] [address]    Culture (A)  Final    <10,000 COLONIES/mL INSIGNIFICANT GROWTH Performed at [facility] [address] [place], [place] [address]    Report Status 01/02/2019 FINAL  Final  MRSA PCR Screening     Status: None   Collection Time: 01/02/19 10:30 AM  Result Value Ref Range Status   MRSA by PCR NEGATIVE NEGATIVE Final    Comment:        The GeneXpert MRSA Assay (FDA approved for NASAL specimens only), is one component of a comprehensive MRSA colonization surveillance program. It is not intended to diagnose MRSA infection nor to guide or monitor treatment for MRSA infections. Performed at [facility], [place] [address] [place], [place] [address]   Urine Culture     Status: None   Collection Time:  01/04/19  7:32 AM  Result Value Ref Range Status   Specimen Description   Final    URINE, RANDOM Performed at [facility], [place] [address], [place], [place] [address]    Special Requests   Final    NONE Performed at [facility], [place] [address], [place], [place] [address]    Culture   Final    NO GROWTH Performed at [facility], [place] [address] [place], [place] [address]    Report Status 01/05/2019 FINAL  Final  Culture, body fluid-bottle     Status: None  (Preliminary result)   Collection Time: 01/06/19  3:51 PM  Result Value Ref Range Status   Specimen Description FLUID PLEURAL RIGHT  Final   Special Requests BOTTLES DRAWN AEROBIC AND ANAEROBIC  Final   Culture   Final    NO GROWTH 2 DAYS Performed at [facility], [place] [address], [place], [place] [address]    Report Status PENDING  Incomplete  Gram stain     Status: None   Collection Time: 01/06/19  3:51 PM  Result Value Ref Range Status   Specimen Description FLUID PLEURAL RIGHT  Final   Special Requests NONE  Final   Gram Stain   Final    MODERATE WBC PRESENT,BOTH PMN AND MONONUCLEAR NO ORGANISMS SEEN Performed at [facility], [address] [place], [place] [address]    Report Status 01/07/2019 FINAL  Final     Labs: Basic Metabolic Panel: Recent Labs  Lab 01/04/19 0320 01/05/19 0303 01/06/19 0305 01/07/19 0247 01/08/19 0609  NA 138 139 138 138 139  K 4.1 3.6 3.2* 3.4* 3.5  CL 104 105 105 104 103  CO2 25 23 23 23 24   GLUCOSE 109* 112* 97 100* 90  BUN 6 5* 7 8 8   CREATININE 0.57 0.53 0.43* 0.47 0.42*  CALCIUM 8.2* 8.0* 7.8* 7.8* 8.0*  MG  --   --  2.1 2.1  --    Liver Function Tests: Recent Labs  Lab 01/03/19 0622 01/04/19 0320 01/05/19 0303 01/06/19 0305 01/07/19 0247  AST 198* 81* 43* 39 26  ALT 383* 264* 173* 115* 81*  ALKPHOS 223* 221* 210* 175* 168*  BILITOT 1.5* 1.2 1.2 1.2 0.9  PROT 5.7* 6.2* 6.3* 5.6* 5.9*  ALBUMIN 2.6* 2.6* 2.5* 2.2* 2.1*   CBC: Recent Labs  Lab 01/04/19 0320  01/05/19 0303 01/06/19 0305 01/06/19 1411 01/07/19 0247 01/08/19 0609  WBC 15.8*  --  14.4* 13.3*  --  13.5* 14.8*  HGB 8.4*   < > 8.5* 7.7* 8.5* 7.9* 7.9*  HCT 27.2*   < > 27.7* 25.4* 27.7* 25.9* 26.2*  MCV 95.4  --  94.2 93.4  --  92.2 92.9  PLT 271  --  350 393  --  454* 539*   < > = values in this interval not displayed.    CBG: Recent Labs  Lab 01/04/19 0750 01/05/19 0743 01/06/19 0801 01/07/19 0757 01/08/19 0737  GLUCAP 98 87 89 83  84     IMAGING STUDIES Ct Abdomen Pelvis Wo Contrast  Addendum Date: 01/02/2019   ADDENDUM REPORT: 01/02/2019 10:53 ADDENDUM: These results were called by telephone at the time of interpretation on 01/02/2019 at 10:43 am to Dr. Niel Hummer , who verbally acknowledged these results. Electronically Signed   By: Jacqulynn Cadet M.D.   On: 01/02/2019 10:53   Result Date: 01/02/2019 CLINICAL DATA:  53 year old female with  lethargy, decreased arousal and new onset anemia. Patient underwent liver biopsy yesterday. Evaluate for hemorrhage. EXAM: CT ABDOMEN AND PELVIS WITHOUT CONTRAST TECHNIQUE: Multidetector CT imaging of the abdomen and pelvis was performed following the standard protocol without IV contrast. COMPARISON:  Recent prior CT scan of the abdomen and pelvis 12/31/2018; MRI of the abdomen 01/01/2019 and CT scan of the chest also 01/01/2019 FINDINGS: Lower chest: Increased round atelectasis in the posterior aspect of the right lower lobe persistent left lower lobe atelectasis. Trace right pleural effusion. The intracardiac blood pool is hypodense relative to the adjacent myocardium consistent with anemia. Hepatobiliary: Hepatomegaly with multiple hepatic masses as previously seen. There is new high attenuation within the mass in the posterior aspect of the right hemi liver as well as several small foci of gas likely representing the site of prior biopsy complicated by some intralesional hemorrhage. Gallstones again noted within the gallbladder. Pancreas: Unremarkable. No pancreatic ductal dilatation or surrounding inflammatory changes. Spleen: Normal in size without focal abnormality. Adrenals/Urinary Tract: Normal adrenal glands. No evidence of hydronephrosis or nephrolithiasis. 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 Stomach/Bowel: No focal bowel wall thickening or evidence of obstruction. Normal appendix in the right lower quadrant.  Vascular/Lymphatic: Limited evaluation in the absence of intravenous contrast. No evidence of aneurysm. Reproductive: Dystrophic calcified uterine fibroids. No adnexal masses. Other: Interval development of moderate hemoperitoneum predominantly layering in the anatomic pelvis with a trace amount in the pericolic gutters. Musculoskeletal: No acute fracture or aggressive appearing lytic or blastic osseous lesion. IMPRESSION: 1. Positive for new intralesional hemorrhage and new small-moderate hemoperitoneum layering in the anatomic pelvis. Findings likely represent bleeding from the recent biopsy site. 2. Slightly increased bilateral lower lobe atelectasis. 3. Otherwise, no significant interval change compared to recent prior imaging. Electronically Signed: By: Jacqulynn Cadet M.D. On: 01/02/2019 10:32   Dg Chest 2 View  Result Date: 01/06/2019 CLINICAL DATA:  Pleural effusion. EXAM: CHEST - 2 VIEW COMPARISON:  Chest x-rays dated 01/05/2019 and 12/31/2018 and chest CT dated 01/05/2019 FINDINGS: The moderate right effusion has slightly increased since 01/05/2019. Heart size and vascularity remain normal. Left lung is clear. Bones are normal. IMPRESSION: Slight increase in the moderate right effusion. Electronically Signed   By: Lorriane Shire M.D.   On: 01/06/2019 09:07   Dg Chest 2 View  Result Date: 12/31/2018 CLINICAL DATA:  Chest pain radiating into back today.  Vomiting. EXAM: CHEST - 2 VIEW COMPARISON:  None. FINDINGS: Heart size is normal. Lung volumes are low. Mild bibasilar atelectasis is present. No significant airspace consolidation is present. There is no edema or effusion. IMPRESSION: Low lung volumes and mild bibasilar atelectasis. No acute cardiopulmonary disease. Electronically Signed   By: San Morelle M.D.   On: 12/31/2018 16:22   Ct Chest W Contrast  Result Date: 01/05/2019 CLINICAL DATA:  Upper abdominal pain for 1.5 weeks radiating into the back. The patient underwent biopsy of  liver mass 01/01/2019. Pathology showed a benign adenoma. EXAM: CT CHEST, ABDOMEN, AND PELVIS WITH CONTRAST TECHNIQUE: Multidetector CT imaging of the chest, abdomen and pelvis was performed following the standard protocol during bolus administration of intravenous contrast. CONTRAST:  100 mL ISOVUE-300 IOPAMIDOL (ISOVUE-300) INJECTION 61% COMPARISON:  CT abdomen and 01/02/2019 and 12/31/2018. CT chest 01/01/2019. FINDINGS: CT CHEST FINDINGS Cardiovascular: No significant vascular findings. Normal heart size. No pericardial effusion. Mediastinum/Nodes: No enlarged mediastinal, hilar, or axillary lymph nodes. Thyroid gland, trachea, and esophagus demonstrate no  significant findings. Lungs/Pleura: Small to moderate right pleural effusion. Trace left pleural effusion. Compressive atelectasis of the right lower lobe. Minimal dependent atelectasis on the left is noted. Tiny bilateral pulmonary nodule seen on prior chest CT are unchanged. Musculoskeletal: Negative. CT ABDOMEN PELVIS FINDINGS Hepatobiliary: Multiple hepatic masses are identified as seen on comparison examination. 2-3 tiny locules of gas are noted a lesion in the posterior right hepatic lobe, decreased since the most recent exam. There is a small amount of perihepatic hemorrhage along the posterior right hepatic lobe. Gallstones without evidence of cholecystitis noted. Pancreas: Unremarkable. No pancreatic ductal dilatation or surrounding inflammatory changes. Spleen: Normal in size without focal abnormality. 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 The kidneys are otherwise unremarkable. The adrenal glands appear normal. Urinary bladder appears normal. Stomach/Bowel: Stomach is within normal limits. Appendix appears normal. No evidence of bowel wall thickening, distention, or inflammatory changes. Vascular/Lymphatic: No significant vascular findings are present. No enlarged abdominal or pelvic lymph nodes. Reproductive:  Calcified uterine fibroid noted.  No adnexal mass. Other: Hemoperitoneum recent in the pelvis is decreased in volume examination since the most recent exam. Musculoskeletal: Negative. IMPRESSION: No new abnormality since the most recent study. Multiple hepatic masses consistent with known benign adenomas. Small locules of gas in a lesion in the right hepatic lobe are seen and decreased since the most recent examination consistent with resolving post biopsy change. Decreased hemoperitoneum. Small to moderate right pleural effusion with associated compressive atelectasis of the right lower lobe. Minimal dependent atelectasis left lower lobe noted. No change in 2-3 tiny pulmonary nodules, nonspecific. Gallstones without evidence of cholecystitis. Electronically Signed   By: Inge Rise M.D.   On: 01/05/2019 19:49   Ct Chest W Contrast  Result Date: 01/01/2019 CLINICAL DATA:  Midsternal chest pain. Multiple large liver lesions on previous recent imaging. EXAM: CT CHEST WITH CONTRAST TECHNIQUE: Multidetector CT imaging of the chest was performed during intravenous contrast administration. CONTRAST:  87mL ISOVUE-300 IOPAMIDOL (ISOVUE-300) INJECTION 61% COMPARISON:  None. FINDINGS: Cardiovascular: Heart size upper normal. No pericardial effusion. No thoracic aortic aneurysm. Mediastinum/Nodes: Small mediastinal lymph nodes are evident without mediastinal lymphadenopathy. There is no hilar lymphadenopathy. The esophagus has normal imaging features. There is no axillary lymphadenopathy. Lungs/Pleura: The central tracheobronchial airways are patent. 5 mm right upper lobe ground-glass nodule identified on 48/5. 3 mm left upper lobe nodule (27/5) may be cavitary. Ill-defined 8 mm nodule identified central left upper lobe on 45/5. Dependent atelectasis noted in the lower lobes bilaterally and associated airspace infection could not be excluded. There is no evidence for pleural effusion. Upper Abdomen: Large posterior  right hepatic mass noted with other smaller hepatic lesions evident, better characterized on previous imaging. Musculoskeletal: No worrisome lytic or sclerotic osseous abnormality. IMPRESSION: 1. No evidence for primary malignancy in the thorax. 2. Scattered tiny bilateral pulmonary nodules are too small to characterize and nonspecific. Metastatic disease could certainly have this appearance and close attention on follow-up recommended. 3. Known hepatic lesions again noted. Electronically Signed   By: Misty Stanley M.D.   On: 01/01/2019 13:18   Mr Abdomen W Wo Contrast  Result Date: 01/01/2019 CLINICAL DATA:  Followup multiple liver lesions. EXAM: MRI ABDOMEN WITHOUT AND WITH CONTRAST TECHNIQUE: Multiplanar multisequence MR imaging of the abdomen was performed both before and after the administration of intravenous contrast. CONTRAST:  10 cc Gadavist COMPARISON:  Ultrasound abdomen, 12/31/2018 and CT abdomen 12/31/2018. FINDINGS: Lower chest: Bibasilar atelectasis is noted. No obvious pulmonary lesions.  An 8 mm right lower lobe pulmonary nodule was noted on the CT scan. The distal esophagus is grossly normal. Hepatobiliary: As demonstrated on the ultrasound and CT scan there are innumerable large masses throughout both lobes of the liver. 8 cm segment 8 liver lesion appears to have a central scar. Similar appearance of the 9 cm segment 3 lesion which has central necrosis and hemorrhage. 9.5 cm segment 6 lesion demonstrates extensive necrosis with variable areas of hemorrhage. Gallstones are again noted in the gallbladder but no definite findings for acute cholecystitis. Pancreas:  No mass, inflammation or ductal dilatation. Spleen:  Normal size.  No focal lesions. Adrenals/Urinary Tract: The adrenal glands and kidneys are unremarkable. Simple left renal cyst is noted. Stomach/Bowel: No evidence of a gastric mass. The small bowel and colon are grossly normal. No obvious colon cancer is demonstrated on this exam or  the CT scan. Vascular/Lymphatic: The aorta and branch vessels are patent. The major venous structures are patent. No mesenteric mass or adenopathy. No retroperitoneal adenopathy. Other:  No ascites or abdominal wall hernia. Musculoskeletal: No significant bony findings. IMPRESSION: 1. Numerous large masses throughout both lobes of the liver, likely metastatic disease but possible multifocal primary hepatic tumors. No obvious primary neoplasm is identified. Specifically, I do not see any evidence of a gastric or pancreatic tumor or colon cancer. No obvious breast masses. 2. Recommend consideration for ultrasound-guided liver biopsy and chest CT to exclude a pulmonary neoplasm. Electronically Signed   By: Marijo Sanes M.D.   On: 01/01/2019 09:29   Nm Hepatobiliary Liver Func  Result Date: 01/06/2019 CLINICAL DATA:  Pain with nausea and vomiting EXAM: NUCLEAR MEDICINE HEPATOBILIARY IMAGING TECHNIQUE: Sequential images of the abdomen were obtained out to 60 minutes following intravenous administration of radiopharmaceutical. RADIOPHARMACEUTICALS:  5.0 mCi Tc-72m  Choletec IV COMPARISON:  None. FINDINGS: Prompt uptake and biliary excretion of activity by the liver is seen. There is abnormal radiotracer uptake throughout portions of the right lobe of the liver. Gallbladder activity is visualized, consistent with patency of cystic duct. Biliary activity passes into small bowel, consistent with patent common bile duct. No appreciable ectopic radiotracer uptake. IMPRESSION: Areas of abnormal radiotracer uptake in the liver of uncertain etiology. Patient is scheduled for abdominal CT which should be helpful for further assessment of this abnormal radiotracer uptake in the liver. Cystic and common bile ducts are patent as is evidenced by visualization of gallbladder and small bowel. Electronically Signed   By: Lowella Grip III M.D.   On: 01/06/2019 10:49   Ct Abdomen Pelvis W Contrast  Result Date:  01/05/2019 CLINICAL DATA:  Upper abdominal pain for 1.5 weeks radiating into the back. The patient underwent biopsy of liver mass 01/01/2019. Pathology showed a benign adenoma. EXAM: CT CHEST, ABDOMEN, AND PELVIS WITH CONTRAST TECHNIQUE: Multidetector CT imaging of the chest, abdomen and pelvis was performed following the standard protocol during bolus administration of intravenous contrast. CONTRAST:  100 mL ISOVUE-300 IOPAMIDOL (ISOVUE-300) INJECTION 61% COMPARISON:  CT abdomen and 01/02/2019 and 12/31/2018. CT chest 01/01/2019. FINDINGS: CT CHEST FINDINGS Cardiovascular: No significant vascular findings. Normal heart size. No pericardial effusion. Mediastinum/Nodes: No enlarged mediastinal, hilar, or axillary lymph nodes. Thyroid gland, trachea, and esophagus demonstrate no significant findings. Lungs/Pleura: Small to moderate right pleural effusion. Trace left pleural effusion. Compressive atelectasis of the right lower lobe. Minimal dependent atelectasis on the left is noted. Tiny bilateral pulmonary nodule seen on prior chest CT are unchanged. Musculoskeletal: Negative. CT ABDOMEN PELVIS FINDINGS Hepatobiliary: Multiple  hepatic masses are identified as seen on comparison examination. 2-3 tiny locules of gas are noted a lesion in the posterior right hepatic lobe, decreased since the most recent exam. There is a small amount of perihepatic hemorrhage along the posterior right hepatic lobe. Gallstones without evidence of cholecystitis noted. Pancreas: Unremarkable. No pancreatic ductal dilatation or surrounding inflammatory changes. Spleen: Normal in size without focal abnormality. 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 The kidneys are otherwise unremarkable. The adrenal glands appear normal. Urinary bladder appears normal. Stomach/Bowel: Stomach is within normal limits. Appendix appears normal. No evidence of bowel wall thickening, distention, or inflammatory changes.  Vascular/Lymphatic: No significant vascular findings are present. No enlarged abdominal or pelvic lymph nodes. Reproductive: Calcified uterine fibroid noted.  No adnexal mass. Other: Hemoperitoneum recent in the pelvis is decreased in volume examination since the most recent exam. Musculoskeletal: Negative. IMPRESSION: No new abnormality since the most recent study. Multiple hepatic masses consistent with known benign adenomas. Small locules of gas in a lesion in the right hepatic lobe are seen and decreased since the most recent examination consistent with resolving post biopsy change. Decreased hemoperitoneum. Small to moderate right pleural effusion with associated compressive atelectasis of the right lower lobe. Minimal dependent atelectasis left lower lobe noted. No change in 2-3 tiny pulmonary nodules, nonspecific. Gallstones without evidence of cholecystitis. Electronically Signed   By: Inge Rise M.D.   On: 01/05/2019 19:49   Ct Abdomen Pelvis W Contrast  Result Date: 12/31/2018 CLINICAL DATA:  53 y/o F; epigastric pain radiating to the back with nausea and vomiting for 3 days. EXAM: CT ABDOMEN AND PELVIS WITH CONTRAST TECHNIQUE: Multidetector CT imaging of the abdomen and pelvis was performed using the standard protocol following bolus administration of intravenous contrast. CONTRAST:  169mL ISOVUE-300 IOPAMIDOL (ISOVUE-300) INJECTION 61% COMPARISON:  None. FINDINGS: Lower chest: 8 mm right lower lobe pulmonary nodule (series 4, image 1). Hepatobiliary: Cholelithiasis. No gallbladder wall thickening or biliary ductal dilatation. Numerous liver masses occupying approximately 70% of liver parenchyma measuring up to 9.2 x 9.1 cm in the right lobe of liver (series 2, image 24). Several masses are peripheral and exophytic, however, no extrahepatic invasion is identified. Pancreas: Unremarkable. No pancreatic ductal dilatation or surrounding inflammatory changes. Spleen: Normal in size without focal  abnormality. Adrenals/Urinary Tract: Normal adrenal glands. 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 No additional focal kidney lesion, urinary stone disease, hydronephrosis. Normal bladder. Stomach/Bowel: Stomach is within normal limits. Appendix appears normal. No evidence of bowel wall thickening, distention, or inflammatory changes. Vascular/Lymphatic: No significant vascular findings are present. No enlarged abdominal or pelvic lymph nodes. Reproductive: Multiple uterine fibroids measuring up to 4.2 cm. No adnexal mass identified. Other: No abdominal wall hernia or abnormality. No abdominopelvic ascites. Musculoskeletal: No acute or significant osseous findings. IMPRESSION: 1. Numerous nonspecific liver masses measuring up to 9.2 cm. No extrahepatic invasion is identified. Some differential considerations include metastasis, primary hepatocellular or biliary malignancy, hepatic adenomatosis, and lymphoma. MRI of the abdomen with and without contrast may help to characterize and tissue sampling is recommended. 2. 8 mm right lower lobe pulmonary nodule. 3. Cholelithiasis.  No findings of acute cholecystitis. 4. Multiple uterine fibroids measuring up to 4.2 cm. 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 Electronically Signed   By: Kristine Garbe M.D.   On: 12/31/2018 21:42   US Abdomen Limited  Result Date: 12/31/2018 CLINICAL DATA:  Sudden onset epigastric pain with nausea and vomiting. EXAM:  ULTRASOUND ABDOMEN LIMITED RIGHT UPPER QUADRANT COMPARISON:  None. FINDINGS: Gallbladder: Small gallstones and sludge are noted. No wall thickening, however there is a focal irregularity of the gallbladder wall adjacent to a large mass in the right hepatic lobe. No sonographic Murphy sign noted by sonographer. Common bile duct: Diameter: 1 mm, normal. Liver: There is a 6.2 x 4.7 x 4.0 cm heterogeneous mass with internal vascularity in the right hepatic lobe adjacent to  the gallbladder. There is also a 5.5 x 2.1 x 2.9 cm heterogeneous mass with internal vascularity in the left hepatic lobe. Heterogeneous parenchymal echogenicity. Portal vein is patent on color Doppler imaging with normal direction of blood flow towards the liver. IMPRESSION: 1. Large solid-appearing heterogeneous masses in the left and right hepatic lobes. The right hepatic lobe mass may be invading the gallbladder given adjacent gallbladder wall irregularity. Findings are concerning for malignancy. Recommend CT of the abdomen and pelvis with contrast for further evaluation. 2. Cholelithiasis and sludge without sonographic evidence of acute cholecystitis. Electronically Signed   By: Titus Dubin M.D.   On: 12/31/2018 20:04   Ir US Guide Bx Asp/drain  Result Date: 01/01/2019 INDICATION: No known primary, now with multiple liver lesions and masses worrisome for metastatic disease. Please from ultrasound-guided liver mass biopsy for tissue diagnostic purposes. EXAM: ULTRASOUND GUIDED LIVER LESION BIOPSY COMPARISON:  CT abdomen and pelvis - 12/31/2018; abdominal MRI - 01/01/2019 MEDICATIONS: None ANESTHESIA/SEDATION: Fentanyl 75 mcg IV; Versed 1.5 mg IV Total Moderate Sedation time:  13 Minutes. The patient's level of consciousness and vital signs were monitored continuously by radiology nursing throughout the procedure under my direct supervision. COMPLICATIONS: None immediate. PROCEDURE: Informed written consent was obtained from the patient after a discussion of the risks, benefits and alternatives to treatment. The patient understands and consents the procedure. A timeout was performed prior to the initiation of the procedure. Ultrasound scanning was performed of the right upper abdominal quadrant demonstrates multiple mixed echogenic liver lesions and masses compatible with findings seen on preceding abdominal CT and MRI. Dominant lesion within the posterior aspect the right lobe of the liver was targeted for  biopsy given lesion location and sonographic window. The procedure was planned. The right upper abdominal quadrant was prepped and draped in the usual sterile fashion. The overlying soft tissues were anesthetized with 1% lidocaine with epinephrine. A 17 gauge, 6.8 cm co-axial needle was advanced into a peripheral aspect of the lesion. This was followed by 5 core biopsies with an 18 gauge core device under direct ultrasound guidance. The coaxial needle tract was embolized with a small amount of Gel-Foam slurry and superficial hemostasis was obtained with manual compression. Post procedural scanning was negative for definitive area of hemorrhage or additional complication. A dressing was placed. The patient tolerated the procedure well without immediate post procedural complication. IMPRESSION: Technically successful ultrasound guided core needle biopsy of dominant mass within the posterior segment of the right lobe of the liver. Electronically Signed   By: Sandi Mariscal M.D.   On: 01/01/2019 17:34   Dg Chest Port 1 View  Result Date: 01/06/2019 CLINICAL DATA:  Status post right thoracentesis EXAM: PORTABLE CHEST 1 VIEW COMPARISON:  Chest radiographs dated 01/06/2019. CT chest dated 01/05/2019. FINDINGS: Volume loss in the right hemithorax. Patchy right lower lung opacities. Left lung is clear. No pneumothorax is seen. The heart is normal in size. IMPRESSION: No pneumothorax status post right thoracentesis. Patchy right lower lung opacities, better evaluated on CT. Electronically Signed   By:  Julian Hy M.D.   On: 01/06/2019 16:03   Dg Chest Port 1 View  Result Date: 01/05/2019 CLINICAL DATA:  Fevers EXAM: PORTABLE CHEST 1 VIEW COMPARISON:  12/31/2018 FINDINGS: Cardiac shadow is within normal limits. The left lung remains clear. Elevation the right hemidiaphragm is noted with increasing right-sided pleural effusion and basilar atelectasis. No acute bony abnormality is seen. IMPRESSION: Increasing  right-sided effusion and basilar atelectasis. Electronically Signed   By: Inez Catalina M.D.   On: 01/05/2019 13:34   US Thoracentesis Asp Pleural Space W/img Guide  Result Date: 01/06/2019 INDICATION: Patient with history of recent right liver lesion biopsy and subsequent intralesional hemorrhage/hemoperitoneum; now with right pleural effusion, fevers; request made for diagnostic and therapeutic right thoracentesis. EXAM: ULTRASOUND GUIDED DIAGNOSTIC AND THERAPEUTIC RIGHT THORACENTESIS MEDICATIONS: None COMPLICATIONS: None immediate. PROCEDURE: An ultrasound guided thoracentesis was thoroughly discussed with the patient and questions answered. The benefits, risks, alternatives and complications were also discussed. The patient understands and wishes to proceed with the procedure. Written consent was obtained. Ultrasound was performed to localize and mark an adequate pocket of fluid in the right chest. The area was then prepped and draped in the normal sterile fashion. 1% Lidocaine was used for local anesthesia. Under ultrasound guidance a 6 Fr Safe-T-Centesis catheter was introduced. Thoracentesis was performed. The catheter was removed and a dressing applied. FINDINGS: A total of approximately 445 cc of bloody fluid was removed. Samples were sent to the laboratory as requested by the clinical team. IMPRESSION: Successful ultrasound guided diagnostic and therapeutic right thoracentesis yielding 445 cc of pleural fluid. CCM notified of above findings. Read by: Rowe Robert, PA-C Electronically Signed   By: Jerilynn Mages.  Shick M.D.   On: 01/06/2019 15:58    DISCHARGE EXAMINATION: Vitals:   01/07/19 2012 01/07/19 2014 01/08/19 0420 01/08/19 1354  BP:  (!) 147/80 133/81 136/80  Pulse:  (!) 115 (!) 106 (!) 105  Resp:  18 18 18   Temp:  (!) 100.6 F (38.1 C) 99.1 F (37.3 C) (!) 100.6 F (38.1 C)  TempSrc:  Oral Oral Oral  SpO2:  98% 98% 98%  Weight: 89.3 kg     Height: 5\' 4"  (1.626 m)      See exam from earlier  today  DISPOSITION: [facility] in [place]     Current Inpatient Medications:  Scheduled: . Influenza vac split quadrivalent PF  0.5 mL Intramuscular Tomorrow-1000  . mouth rinse  15 mL Mouth Rinse BID  . pantoprazole (PROTONIX) IV  40 mg Intravenous Q12H  . [START ON 01/09/2019] senna-docusate  1 tablet Oral QHS   Continuous: . ceFEPime (MAXIPIME) IV 1 g (01/08/19 1005)   FBP:ZWCHENIDPOEUM **OR** acetaminophen, hydrALAZINE, HYDROmorphone (DILAUDID) injection, Influenza vac split quadrivalent PF, levalbuterol, LORazepam, ondansetron **OR** ondansetron (ZOFRAN) IV, oxyCODONE, polyethylene glycol   [facility] Pager [phone]  01/08/2019, 3:44 PM

## 2019-01-08 NOTE — Progress Notes (Addendum)
PROGRESS NOTE    Jodi Forbes  SNK:[id number] DOB: 1966/07/03 DOA: 12/31/2018 PCP: Patient, No Pcp Per    Brief Narrative:  53 year old with no significant past medical history who presented to the emergency department complaining of abdominal pain that started the night prior to admission. She also report nausea vomiting restarted the day prior to admission. Evaluation in the ED showed creased transaminases alkaline phosphatase 265, leukocytosis at 13,.  Right upper quadrant ultrasound was concerning for large solid-appearing masses with concern for possible invasion into the gallbladder. CT abdomen show numerous nonspecific liver masses without extrahepatic invasion possible metastatic versus primary hepatocellular or biliary malignancy.  Surgery was consulted, patient underwent MRI of the abdomen which showed multiple liver masses could be related to metastatic disease or primary liver cancer.  Surgery recommended IR to do liver biopsy.  Pathology revealed benign adenoma. Patient underwent liver biopsy by IR January 01, 2019. Subsequently patient developed a acute blood loss anemia.  Repeated CT scan confirmed retroperitoneal hematoma and new intralesional hemorrhage in the liver.  Patient was transferred to the stepdown unit she received 2 units of packed red blood cell.  Her hemoglobin has remained stable at 8. Patient has been having fevers, and leukocytosis.  He was a started on IV Unasyn.  Blood cultures drawn 01/01/2019 has been negative.  Urine culture also negative.  HIDA scan was ordered which did not raise any concern for cholecystitis. Patient's case was discussed at tumor board and it was recommended that she be transferred to a tertiary center where they have hepatology.  [facility] [place]  Accepted in transfer.  Waiting on a bed.   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 Hepatitis panel negative.  CT chest multiples small pulmonary nodule.  Pathology suggests hepatocellular adenoma. Patient was seen by oncology and radiation oncology.    Does not need any oncologic treatment at this time.   Patient's case was discussed at tumor board and it was recommended that she be transferred to a center where they have hepatology for consideration of transplant.  Radiation oncology discussed this with providers [facility] [place].  She will be transferred there when bed is available. Patient continues to have periodic pain requiring IV pain medications.  Has had multiple bowel movements.  Will cut back on the dose of her laxatives.  Leukocytosis/fever/concern for pneumonia Most likely due to peritoneal hemorrhage. blood cultures; no growth to date. influenza negative.  Patient was initially placed on Unasyn.  Transitioned over to vancomycin and cefepime.  Chest x-ray showed atelectasis and effusion.  Pulmonology was consulted.  MRSA PCR is negative.  Comycin was discontinued.  Cefepime was continued.  Still with low-grade fevers.  HIDA scan did not show any cholecystitis. Procalcitonin level 0.66.  Anemia;acute blood loss anemia; intralesional hemorrhage and retroperitoneum.  Also Iron deficiency anemia.  Patient's hemoglobin dropped to 6.9.  Improved with 2 units of PRBCs.  Hemoglobin low but stable.  CT scan was repeated which shows improvement in the hemorrhage.  Continue to monitor periodically.  Transfuse as needed.  Atelectasis/right-sided pleural effusion Incentive spirometry.  Continue antibiotics.  Effusion is probably related to the hemorrhage.  Underwent thoracentesis with removal of 445 cc of bloody fluid.  Patient symptoms improved following this.  Wean down oxygen as tolerated.  Pleural fluid cultures are negative so far.  Hypokalemia Potassium level improved.  Give additional  dose today.  Cholelithiasis No clear evidence for cholecystitis on ultrasound.  HIDA scan also did not show any evidence for cholecystitis.  General surgery has signed off.   DVT prophylaxis: SCDs Code Status: Full code Family Communication: Mother at bedside.  Disposition Plan: Waiting on [facility] [place].  Seems to be stable at this time.  Consultants:   Surgery  IR  Gastroenterology  Pulmonology   Procedures:  Liver biopsy  Antimicrobials:  Was on Unasyn.  Now on vancomycin and cefepime.  Vancomycin will be discontinued on 1/9.  Subjective: Patient's pain is 6 out of 10 in intensity in the right upper quadrant of her abdomen.  Denies any nausea or vomiting.  Objective: Vitals:   01/07/19 1936 01/07/19 2012 01/07/19 2014 01/08/19 0420  BP:   (!) 147/80 133/81  Pulse:   (!) 115 (!) 106  Resp:   18 18  Temp: 99.9 F (37.7 C)  (!) 100.6 F (38.1 C) 99.1 F (37.3 C)  TempSrc: Oral  Oral Oral  SpO2:   98% 98%  Weight:  89.3 kg    Height:  5\' 4"  (1.626 m)      Intake/Output Summary (Last 24 hours) at 01/08/2019 0853 Last data filed at 01/07/2019 0933 Gross per 24 hour  Intake 48.6 ml  Output -  Net 48.6 ml   Filed Weights   12/31/18 1555 01/02/19 1030 01/07/19 2012  Weight: 84.8 kg 87.9 kg 89.3 kg    Examination: General appearance: Awake alert.  In no distress.  Noted to be in moderate discomfort due to her abdominal pain. Resp: Mildly tachypneic at rest.  Diminished entry at the bases without any wheezing or rhonchi.  Mild dullness to percussion in the right base.  Few crackles.   Cardio: S1-S2 is mildly tachycardic.  No S3-S4.  No rubs murmurs or bruit.   GI: Abdomen is soft.  Tender in the right upper quadrant without any rebound rigidity or guarding.  No masses organomegaly.  Bowel sounds present.   Extremities: No edema.  Full range of motion of lower extremities. Neurologic: Alert and oriented x3.  No focal neurological deficits.      Data Reviewed: I have personally reviewed following labs and imaging studies  CBC: Recent Labs  Lab 01/04/19 0320  01/05/19 0303 01/06/19 0305 01/06/19 1411 01/07/19 0247 01/08/19 0609  WBC 15.8*  --  14.4* 13.3*  --  13.5* 14.8*  HGB 8.4*   < > 8.5* 7.7* 8.5* 7.9* 7.9*  HCT 27.2*   < > 27.7* 25.4* 27.7* 25.9* 26.2*  MCV 95.4  --  94.2 93.4  --  92.2 92.9  PLT 271  --  350 393  --  454* 539*   < > = values in this interval not displayed.   Basic Metabolic Panel: Recent Labs  Lab 01/04/19 0320 01/05/19 0303 01/06/19 0305 01/07/19 0247 01/08/19 0609  NA 138 139 138 138 139  K 4.1 3.6 3.2* 3.4* 3.5  CL 104 105 105 104 103  CO2 25 23 23 23 24   GLUCOSE 109* 112* 97 100* 90  BUN 6 5* 7 8 8   CREATININE 0.57 0.53 0.43* 0.47 0.42*  CALCIUM 8.2* 8.0* 7.8* 7.8* 8.0*  MG  --   --  2.1 2.1  --    GFR: Estimated Creatinine Clearance: 90 mL/min (A) (by C-G formula based on SCr of 0.42 mg/dL (L)). Liver Function Tests: Recent Labs  Lab 01/03/19 8502 01/04/19 0320 01/05/19 0303 01/06/19 7741  01/07/19 0247  AST 198* 81* 43* 39 26  ALT 383* 264* 173* 115* 81*  ALKPHOS 223* 221* 210* 175* 168*  BILITOT 1.5* 1.2 1.2 1.2 0.9  PROT 5.7* 6.2* 6.3* 5.6* 5.9*  ALBUMIN 2.6* 2.6* 2.5* 2.2* 2.1*   Coagulation Profile: Recent Labs  Lab 01/02/19 1057  INR 1.21   CBG: Recent Labs  Lab 01/04/19 0750 01/05/19 0743 01/06/19 0801 01/07/19 0757 01/08/19 0737  GLUCAP 98 87 89 83 84   Sepsis Labs: Recent Labs  Lab 01/05/19 1701 01/06/19 0305 01/07/19 0247  PROCALCITON 0.68 0.66 0.47    Recent Results (from the past 240 hour(s))  Culture, blood (routine x 2)     Status: None   Collection Time: 01/01/19 11:09 AM  Result Value Ref Range Status   Specimen Description   Final    BLOOD RIGHT HAND Performed at [facility], [place] [address] [place], [place] [address]    Special Requests   Final    BOTTLES DRAWN AEROBIC AND ANAEROBIC Blood Culture  adequate volume Performed at [place] [address], [place], [place] [address]    Culture   Final    NO GROWTH 5 DAYS Performed at [facility], [place] [address], [place], [place] [address]    Report Status 01/06/2019 FINAL  Final  Culture, blood (routine x 2)     Status: None   Collection Time: 01/01/19 11:10 AM  Result Value Ref Range Status   Specimen Description   Final    BLOOD LEFT ARM Performed at [place] [address] [place], [place] [address]    Special Requests   Final    BOTTLES DRAWN AEROBIC AND ANAEROBIC Blood Culture adequate volume Performed at [place] [address] [place], [place] [address]    Culture   Final    NO GROWTH 5 DAYS Performed at [facility], [place] [address] [place], [place] [address]    Report Status 01/06/2019 FINAL  Final  Urine Culture     Status: Abnormal   Collection Time: 01/01/19  2:40 PM  Result Value Ref Range Status   Specimen Description   Final    URINE, CLEAN CATCH Performed at [facility], [place] [address] [place], [place] [address]    Special Requests   Final    NONE Performed at [facility], [place] [address] [place], [place] [address]    Culture (A)  Final    <10,000 COLONIES/mL INSIGNIFICANT GROWTH Performed at [place] [address] [place], [place] [address]    Report Status 01/02/2019 FINAL  Final  MRSA PCR Screening     Status: None   Collection Time: 01/02/19 10:30 AM  Result Value Ref Range Status   MRSA by PCR NEGATIVE NEGATIVE Final    Comment:        The GeneXpert MRSA Assay (FDA approved for NASAL specimens only), is one component of a comprehensive MRSA colonization surveillance program. It is not intended to diagnose MRSA infection nor to guide or monitor treatment for MRSA infections. Performed at [facility], [place] [address] [place],  [place] [address]   Urine Culture     Status: None   Collection Time: 01/04/19  7:32 AM  Result Value Ref Range Status   Specimen Description   Final    URINE, RANDOM Performed at [place] [address]  [place], [place] [address]    Special Requests   Final    NONE Performed at [facility], [place] [address] [place], [place] [address]    Culture   Final    NO GROWTH Performed at [facility], [place] [address] [place], [place] [address]    Report Status 01/05/2019 FINAL  Final  Culture, body fluid-bottle     Status: None (Preliminary result)   Collection Time: 01/06/19  3:51 PM  Result Value Ref Range Status   Specimen Description FLUID PLEURAL RIGHT  Final   Special Requests BOTTLES DRAWN AEROBIC AND ANAEROBIC  Final   Culture   Final    NO GROWTH 2 DAYS Performed at [facility], [place] [address] [place], [place] [address]    Report Status PENDING  Incomplete  Gram stain     Status: None   Collection Time: 01/06/19  3:51 PM  Result Value Ref Range Status   Specimen Description FLUID PLEURAL RIGHT  Final   Special Requests NONE  Final   Gram Stain   Final    MODERATE WBC PRESENT,BOTH PMN AND MONONUCLEAR NO ORGANISMS SEEN Performed at [facility], [address], [place], [place] [address]    Report Status 01/07/2019 FINAL  Final         Radiology Studies: Dg Chest 2 View  Result Date: 01/06/2019 CLINICAL DATA:  Pleural effusion. EXAM: CHEST - 2 VIEW COMPARISON:  Chest x-rays dated 01/05/2019 and 12/31/2018 and chest CT dated 01/05/2019 FINDINGS: The moderate right effusion has slightly increased since 01/05/2019. Heart size and vascularity remain normal. Left lung is clear. Bones are normal. IMPRESSION: Slight increase in the moderate right effusion. Electronically Signed   By: Lorriane Shire M.D.   On: 01/06/2019 09:07   Nm Hepatobiliary Liver Func  Result Date: 01/06/2019 CLINICAL DATA:  Pain with nausea and  vomiting EXAM: NUCLEAR MEDICINE HEPATOBILIARY IMAGING TECHNIQUE: Sequential images of the abdomen were obtained out to 60 minutes following intravenous administration of radiopharmaceutical. RADIOPHARMACEUTICALS:  5.0 mCi Tc-53m  Choletec IV COMPARISON:  None. FINDINGS: Prompt uptake and biliary excretion of activity by the liver is seen. There is abnormal radiotracer uptake throughout portions of the right lobe of the liver. Gallbladder activity is visualized, consistent with patency of cystic duct. Biliary activity passes into small bowel, consistent with patent common bile duct. No appreciable ectopic radiotracer uptake. IMPRESSION: Areas of abnormal radiotracer uptake in the liver of uncertain etiology. Patient is scheduled for abdominal CT which should be helpful for further assessment of this abnormal radiotracer uptake in the liver. Cystic and common bile ducts are patent as is evidenced by visualization of gallbladder and small bowel. Electronically Signed   By: Lowella Grip III M.D.   On: 01/06/2019 10:49   Dg Chest Port 1 View  Result Date: 01/06/2019 CLINICAL DATA:  Status post right thoracentesis EXAM: PORTABLE CHEST 1 VIEW COMPARISON:  Chest radiographs dated 01/06/2019. CT chest dated 01/05/2019. FINDINGS: Volume loss in the right hemithorax. Patchy right lower lung opacities. Left lung is clear. No pneumothorax is seen. The heart is normal in size. IMPRESSION: No pneumothorax status post right thoracentesis. Patchy right lower lung opacities, better evaluated on CT. Electronically Signed   By: Julian Hy M.D.   On: 01/06/2019 16:03   US Thoracentesis Asp Pleural Space W/img Guide  Result Date: 01/06/2019 INDICATION: Patient with history of recent right liver lesion biopsy and subsequent intralesional hemorrhage/hemoperitoneum; now with right pleural effusion,  fevers; request made for diagnostic and therapeutic right thoracentesis. EXAM: ULTRASOUND GUIDED DIAGNOSTIC AND THERAPEUTIC  RIGHT THORACENTESIS MEDICATIONS: None COMPLICATIONS: None immediate. PROCEDURE: An ultrasound guided thoracentesis was thoroughly discussed with the patient and questions answered. The benefits, risks, alternatives and complications were also discussed. The patient understands and wishes to proceed with the procedure. Written consent was obtained. Ultrasound was performed to localize and mark an adequate pocket of fluid in the right chest. The area was then prepped and draped in the normal sterile fashion. 1% Lidocaine was used for local anesthesia. Under ultrasound guidance a 6 Fr Safe-T-Centesis catheter was introduced. Thoracentesis was performed. The catheter was removed and a dressing applied. FINDINGS: A total of approximately 445 cc of bloody fluid was removed. Samples were sent to the laboratory as requested by the clinical team. IMPRESSION: Successful ultrasound guided diagnostic and therapeutic right thoracentesis yielding 445 cc of pleural fluid. CCM notified of above findings. Read by: Rowe Robert, PA-C Electronically Signed   By: Jerilynn Mages.  Shick M.D.   On: 01/06/2019 15:58        Scheduled Meds: . Influenza vac split quadrivalent PF  0.5 mL Intramuscular Tomorrow-1000  . mouth rinse  15 mL Mouth Rinse BID  . pantoprazole (PROTONIX) IV  40 mg Intravenous Q12H  . polyethylene glycol  17 g Oral BID  . senna-docusate  1 tablet Oral BID   Continuous Infusions: . ceFEPime (MAXIPIME) IV 1 g (01/08/19 0202)     LOS: 4 days     [name], MD [facility] Pager [phone]  If 7PM-7AM, please contact night-coverage [email or web address] Password [facility] 01/08/2019, 8:53 AM

## 2019-01-09 DIAGNOSIS — J942 Hemothorax: Secondary | ICD-10-CM | POA: Diagnosis not present

## 2019-01-09 DIAGNOSIS — R030 Elevated blood-pressure reading, without diagnosis of hypertension: Secondary | ICD-10-CM | POA: Diagnosis not present

## 2019-01-09 DIAGNOSIS — K661 Hemoperitoneum: Secondary | ICD-10-CM | POA: Diagnosis not present

## 2019-01-09 DIAGNOSIS — D134 Benign neoplasm of liver: Secondary | ICD-10-CM | POA: Diagnosis not present

## 2019-01-09 DIAGNOSIS — D62 Acute posthemorrhagic anemia: Secondary | ICD-10-CM | POA: Diagnosis not present

## 2019-01-09 DIAGNOSIS — R740 Nonspecific elevation of levels of transaminase and lactic acid dehydrogenase [LDH]: Secondary | ICD-10-CM | POA: Diagnosis not present

## 2019-01-09 DIAGNOSIS — R6510 Systemic inflammatory response syndrome (SIRS) of non-infectious origin without acute organ dysfunction: Secondary | ICD-10-CM | POA: Diagnosis not present

## 2019-01-09 MED ORDER — LEVALBUTEROL HCL 0.63 MG/3ML IN NEBU: 0.6300 mg | INHALATION_SOLUTION | Freq: Four times a day (QID) | RESPIRATORY_TRACT | 12 refills | Status: DC | PRN

## 2019-01-09 MED ORDER — ORAL CARE MOUTH RINSE: 15.0000 mL | Freq: Two times a day (BID) | OROMUCOSAL | 0 refills | Status: DC

## 2019-01-09 MED ORDER — HYDRALAZINE HCL 20 MG/ML IJ SOLN: 10.0000 mg | Freq: Four times a day (QID) | INTRAMUSCULAR | Status: DC | PRN

## 2019-01-09 MED ORDER — ACETAMINOPHEN 325 MG PO TABS: 650.0000 mg | ORAL_TABLET | Freq: Four times a day (QID) | ORAL | Status: DC | PRN

## 2019-01-09 MED ORDER — SODIUM CHLORIDE 0.9 % IV SOLN: 1.0000 g | Freq: Three times a day (TID) | INTRAVENOUS | Status: DC

## 2019-01-09 MED ORDER — SENNOSIDES-DOCUSATE SODIUM 8.6-50 MG PO TABS: 1.0000 | ORAL_TABLET | Freq: Every day | ORAL | Status: DC

## 2019-01-09 MED ORDER — HYDROMORPHONE HCL 1 MG/ML IJ SOLN: 0.5000 mg | INTRAMUSCULAR | 0 refills | Status: DC | PRN

## 2019-01-09 MED ORDER — ONDANSETRON HCL 4 MG PO TABS: 4.0000 mg | ORAL_TABLET | Freq: Four times a day (QID) | ORAL | 0 refills | Status: DC | PRN

## 2019-01-09 MED ORDER — LORAZEPAM 0.5 MG PO TABS: 0.5000 mg | ORAL_TABLET | Freq: Four times a day (QID) | ORAL | 0 refills | Status: DC | PRN

## 2019-01-09 MED ORDER — POLYETHYLENE GLYCOL 3350 17 G PO PACK: 17.0000 g | PACK | Freq: Every day | ORAL | 0 refills | Status: DC | PRN

## 2019-01-09 MED ORDER — OXYCODONE HCL 5 MG PO TABS: 5.0000 mg | ORAL_TABLET | Freq: Four times a day (QID) | ORAL | 0 refills | Status: DC | PRN

## 2019-01-09 MED ORDER — PANTOPRAZOLE SODIUM 40 MG IV SOLR: 40.0000 mg | Freq: Two times a day (BID) | INTRAVENOUS | Status: DC

## 2019-01-10 DIAGNOSIS — K661 Hemoperitoneum: Secondary | ICD-10-CM | POA: Diagnosis not present

## 2019-01-10 DIAGNOSIS — R740 Nonspecific elevation of levels of transaminase and lactic acid dehydrogenase [LDH]: Secondary | ICD-10-CM | POA: Diagnosis not present

## 2019-01-10 DIAGNOSIS — D134 Benign neoplasm of liver: Secondary | ICD-10-CM | POA: Diagnosis not present

## 2019-01-10 DIAGNOSIS — R030 Elevated blood-pressure reading, without diagnosis of hypertension: Secondary | ICD-10-CM | POA: Diagnosis not present

## 2019-01-10 DIAGNOSIS — R6510 Systemic inflammatory response syndrome (SIRS) of non-infectious origin without acute organ dysfunction: Secondary | ICD-10-CM | POA: Diagnosis not present

## 2019-01-10 DIAGNOSIS — D62 Acute posthemorrhagic anemia: Secondary | ICD-10-CM | POA: Diagnosis not present

## 2019-01-10 DIAGNOSIS — J942 Hemothorax: Secondary | ICD-10-CM | POA: Diagnosis not present

## 2019-01-11 ENCOUNTER — Ambulatory Visit: Payer: Self-pay

## 2019-01-11 DIAGNOSIS — J942 Hemothorax: Secondary | ICD-10-CM | POA: Diagnosis not present

## 2019-01-11 DIAGNOSIS — D62 Acute posthemorrhagic anemia: Secondary | ICD-10-CM | POA: Diagnosis not present

## 2019-01-11 DIAGNOSIS — R740 Nonspecific elevation of levels of transaminase and lactic acid dehydrogenase [LDH]: Secondary | ICD-10-CM | POA: Diagnosis not present

## 2019-01-11 DIAGNOSIS — D134 Benign neoplasm of liver: Secondary | ICD-10-CM | POA: Diagnosis not present

## 2019-01-11 DIAGNOSIS — R6510 Systemic inflammatory response syndrome (SIRS) of non-infectious origin without acute organ dysfunction: Secondary | ICD-10-CM | POA: Diagnosis not present

## 2019-01-11 DIAGNOSIS — K661 Hemoperitoneum: Secondary | ICD-10-CM | POA: Diagnosis not present

## 2019-01-11 DIAGNOSIS — R030 Elevated blood-pressure reading, without diagnosis of hypertension: Secondary | ICD-10-CM | POA: Diagnosis not present

## 2019-01-11 LAB — CULTURE, BODY FLUID W GRAM STAIN -BOTTLE: Culture: NO GROWTH

## 2019-01-11 LAB — CULTURE, BODY FLUID-BOTTLE

## 2019-01-12 ENCOUNTER — Ambulatory Visit: Payer: Self-pay

## 2019-01-12 DIAGNOSIS — K661 Hemoperitoneum: Secondary | ICD-10-CM | POA: Diagnosis not present

## 2019-01-12 DIAGNOSIS — D126 Benign neoplasm of colon, unspecified: Secondary | ICD-10-CM | POA: Diagnosis not present

## 2019-01-12 DIAGNOSIS — D72829 Elevated white blood cell count, unspecified: Secondary | ICD-10-CM | POA: Diagnosis not present

## 2019-01-13 ENCOUNTER — Ambulatory Visit: Payer: Self-pay

## 2019-01-14 ENCOUNTER — Ambulatory Visit: Payer: Self-pay

## 2019-01-15 ENCOUNTER — Ambulatory Visit: Payer: Self-pay

## 2019-01-15 DIAGNOSIS — R9431 Abnormal electrocardiogram [ECG] [EKG]: Secondary | ICD-10-CM | POA: Diagnosis not present

## 2019-01-15 DIAGNOSIS — Z01810 Encounter for preprocedural cardiovascular examination: Secondary | ICD-10-CM | POA: Diagnosis not present

## 2019-01-15 DIAGNOSIS — R001 Bradycardia, unspecified: Secondary | ICD-10-CM | POA: Diagnosis not present

## 2019-01-18 ENCOUNTER — Ambulatory Visit: Payer: Self-pay

## 2019-01-19 ENCOUNTER — Ambulatory Visit: Payer: Self-pay

## 2019-01-19 DIAGNOSIS — D134 Benign neoplasm of liver: Secondary | ICD-10-CM | POA: Diagnosis not present

## 2019-01-21 DIAGNOSIS — D134 Benign neoplasm of liver: Secondary | ICD-10-CM | POA: Diagnosis not present

## 2019-01-21 DIAGNOSIS — K660 Peritoneal adhesions (postprocedural) (postinfection): Secondary | ICD-10-CM | POA: Diagnosis not present

## 2019-01-21 DIAGNOSIS — E669 Obesity, unspecified: Secondary | ICD-10-CM | POA: Diagnosis not present

## 2019-01-21 DIAGNOSIS — K9161 Intraoperative hemorrhage and hematoma of a digestive system organ or structure complicating a digestive sytem procedure: Secondary | ICD-10-CM | POA: Diagnosis not present

## 2019-02-09 DIAGNOSIS — Z6831 Body mass index (BMI) 31.0-31.9, adult: Secondary | ICD-10-CM | POA: Diagnosis not present

## 2019-02-09 DIAGNOSIS — R739 Hyperglycemia, unspecified: Secondary | ICD-10-CM | POA: Diagnosis not present

## 2019-02-09 DIAGNOSIS — D134 Benign neoplasm of liver: Secondary | ICD-10-CM | POA: Diagnosis not present

## 2019-02-12 DIAGNOSIS — D134 Benign neoplasm of liver: Secondary | ICD-10-CM | POA: Diagnosis not present

## 2019-02-22 DIAGNOSIS — D134 Benign neoplasm of liver: Secondary | ICD-10-CM | POA: Diagnosis not present

## 2019-05-03 ENCOUNTER — Other Ambulatory Visit (HOSPITAL_BASED_OUTPATIENT_CLINIC_OR_DEPARTMENT_OTHER): Payer: Self-pay | Admitting: Surgery

## 2019-05-03 DIAGNOSIS — D134 Benign neoplasm of liver: Secondary | ICD-10-CM

## 2019-05-07 ENCOUNTER — Ambulatory Visit (HOSPITAL_BASED_OUTPATIENT_CLINIC_OR_DEPARTMENT_OTHER): Admission: RE | Admit: 2019-05-07 | Payer: Medicaid Other | Source: Ambulatory Visit

## 2019-06-03 ENCOUNTER — Encounter (HOSPITAL_BASED_OUTPATIENT_CLINIC_OR_DEPARTMENT_OTHER): Payer: Self-pay

## 2019-06-03 ENCOUNTER — Other Ambulatory Visit: Payer: Self-pay

## 2019-06-03 ENCOUNTER — Ambulatory Visit (HOSPITAL_BASED_OUTPATIENT_CLINIC_OR_DEPARTMENT_OTHER)
Admission: RE | Admit: 2019-06-03 | Discharge: 2019-06-03 | Disposition: A | Discharge status: Home / Self Care | Payer: Medicaid Other | Source: Ambulatory Visit | Attending: Surgery | Admitting: Surgery

## 2019-06-03 DIAGNOSIS — D134 Benign neoplasm of liver: Secondary | ICD-10-CM | POA: Insufficient documentation

## 2019-06-03 DIAGNOSIS — R160 Hepatomegaly, not elsewhere classified: Secondary | ICD-10-CM | POA: Diagnosis not present

## 2019-06-03 MED ORDER — IOHEXOL 350 MG/ML SOLN
100.0000 mL | Freq: Once | INTRAVENOUS | Status: AC | PRN
  Administered 2019-06-03: 100 mL via INTRAVENOUS

## 2019-06-11 DIAGNOSIS — D134 Benign neoplasm of liver: Secondary | ICD-10-CM | POA: Diagnosis not present

## 2019-06-23 DIAGNOSIS — D134 Benign neoplasm of liver: Secondary | ICD-10-CM | POA: Diagnosis not present

## 2019-10-18 DIAGNOSIS — Z01812 Encounter for preprocedural laboratory examination: Secondary | ICD-10-CM | POA: Diagnosis not present

## 2019-10-18 DIAGNOSIS — Z20828 Contact with and (suspected) exposure to other viral communicable diseases: Secondary | ICD-10-CM | POA: Diagnosis not present

## 2019-10-18 DIAGNOSIS — Z1159 Encounter for screening for other viral diseases: Secondary | ICD-10-CM | POA: Diagnosis not present

## 2019-10-22 DIAGNOSIS — D134 Benign neoplasm of liver: Secondary | ICD-10-CM | POA: Diagnosis not present

## 2019-12-09 DIAGNOSIS — M25551 Pain in right hip: Secondary | ICD-10-CM | POA: Diagnosis not present

## 2019-12-09 DIAGNOSIS — D134 Benign neoplasm of liver: Secondary | ICD-10-CM | POA: Diagnosis not present

## 2019-12-09 DIAGNOSIS — D352 Benign neoplasm of pituitary gland: Secondary | ICD-10-CM | POA: Diagnosis not present

## 2019-12-10 DIAGNOSIS — D134 Benign neoplasm of liver: Secondary | ICD-10-CM | POA: Diagnosis not present

## 2020-05-04 IMAGING — CR DG CHEST 2V
2 series · 2 of 2 positions shown · non-contrast
Comparison: None.

CLINICAL DATA: Chest pain radiating into back today.  Vomiting.

EXAM:
CHEST - 2 VIEW

[w chest pa]
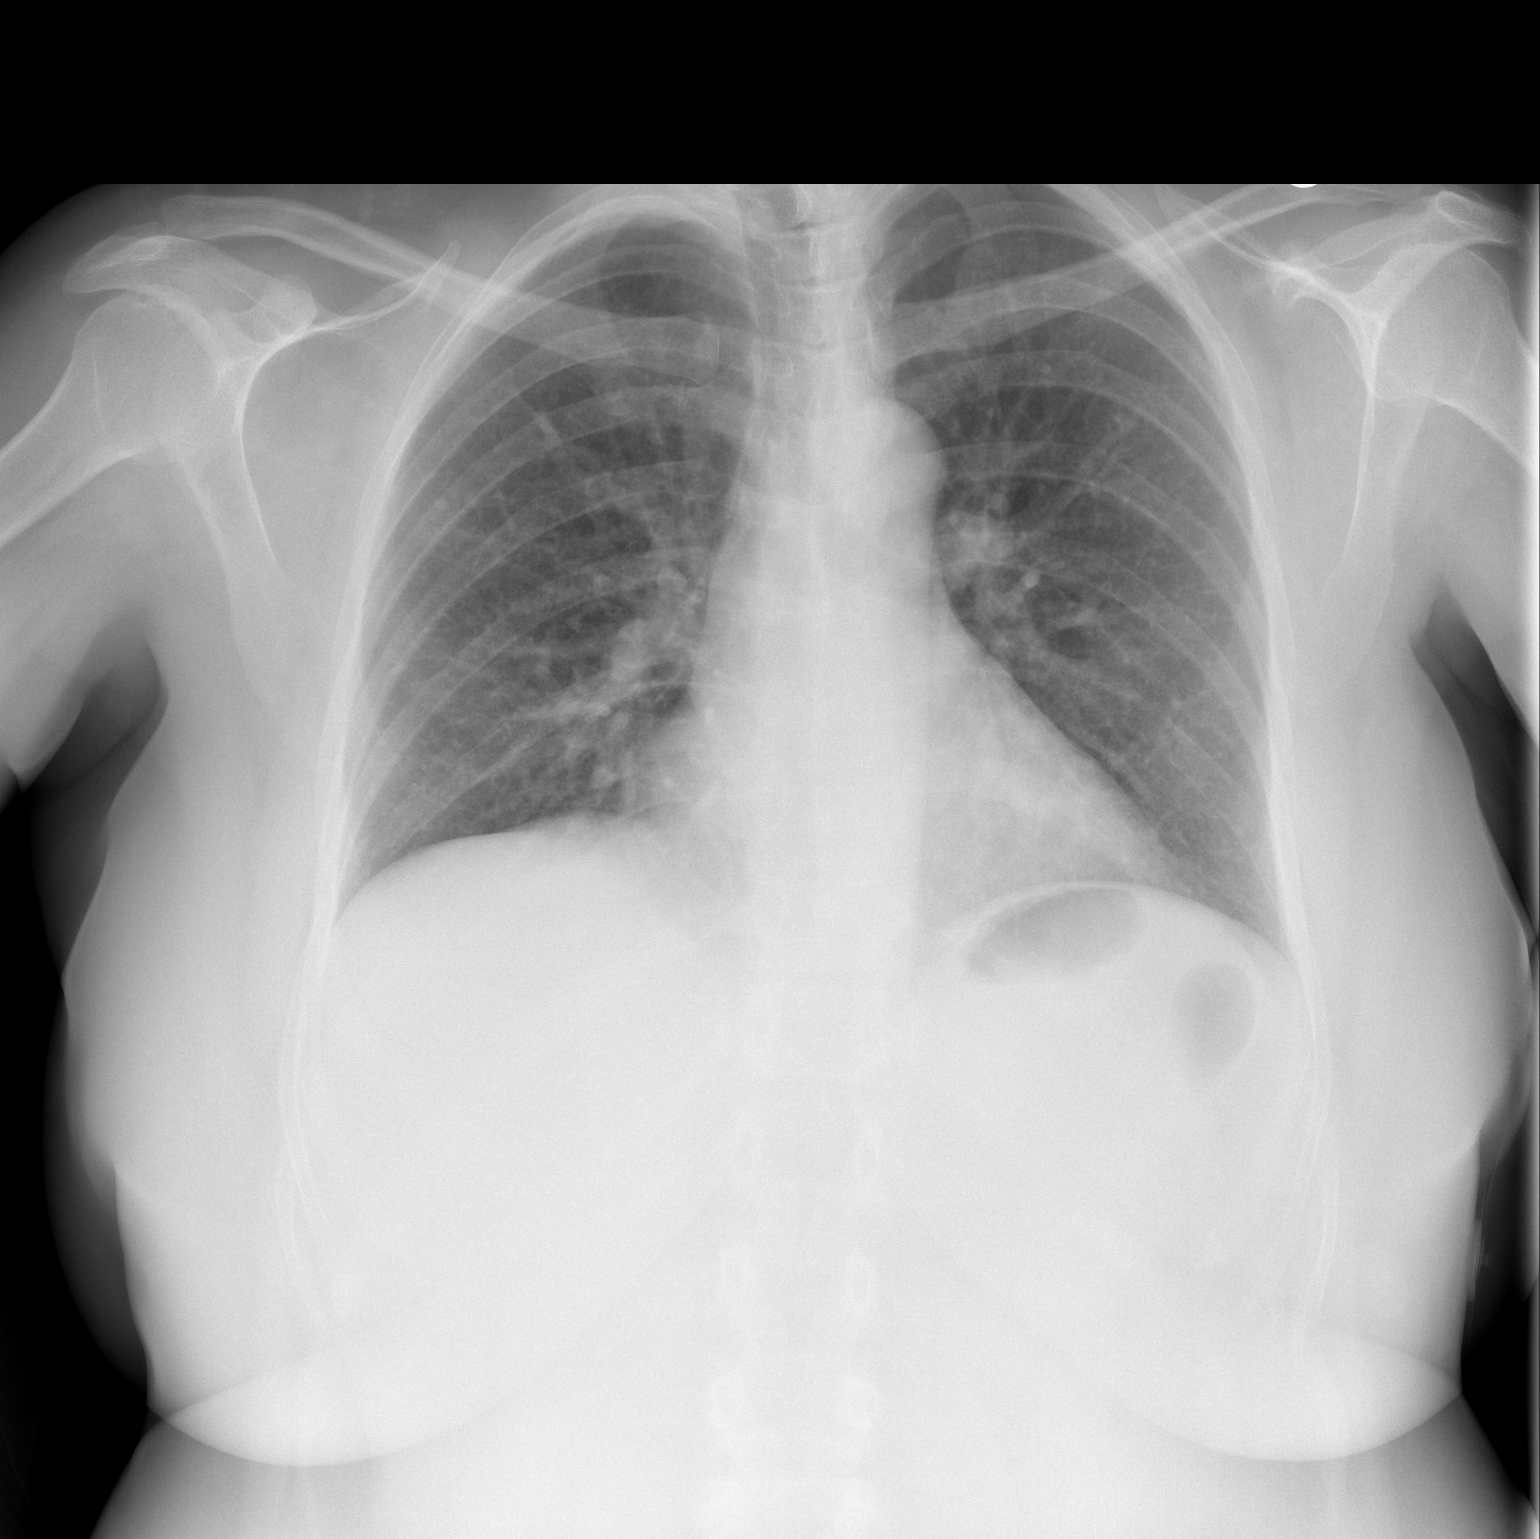

[w chest lat]
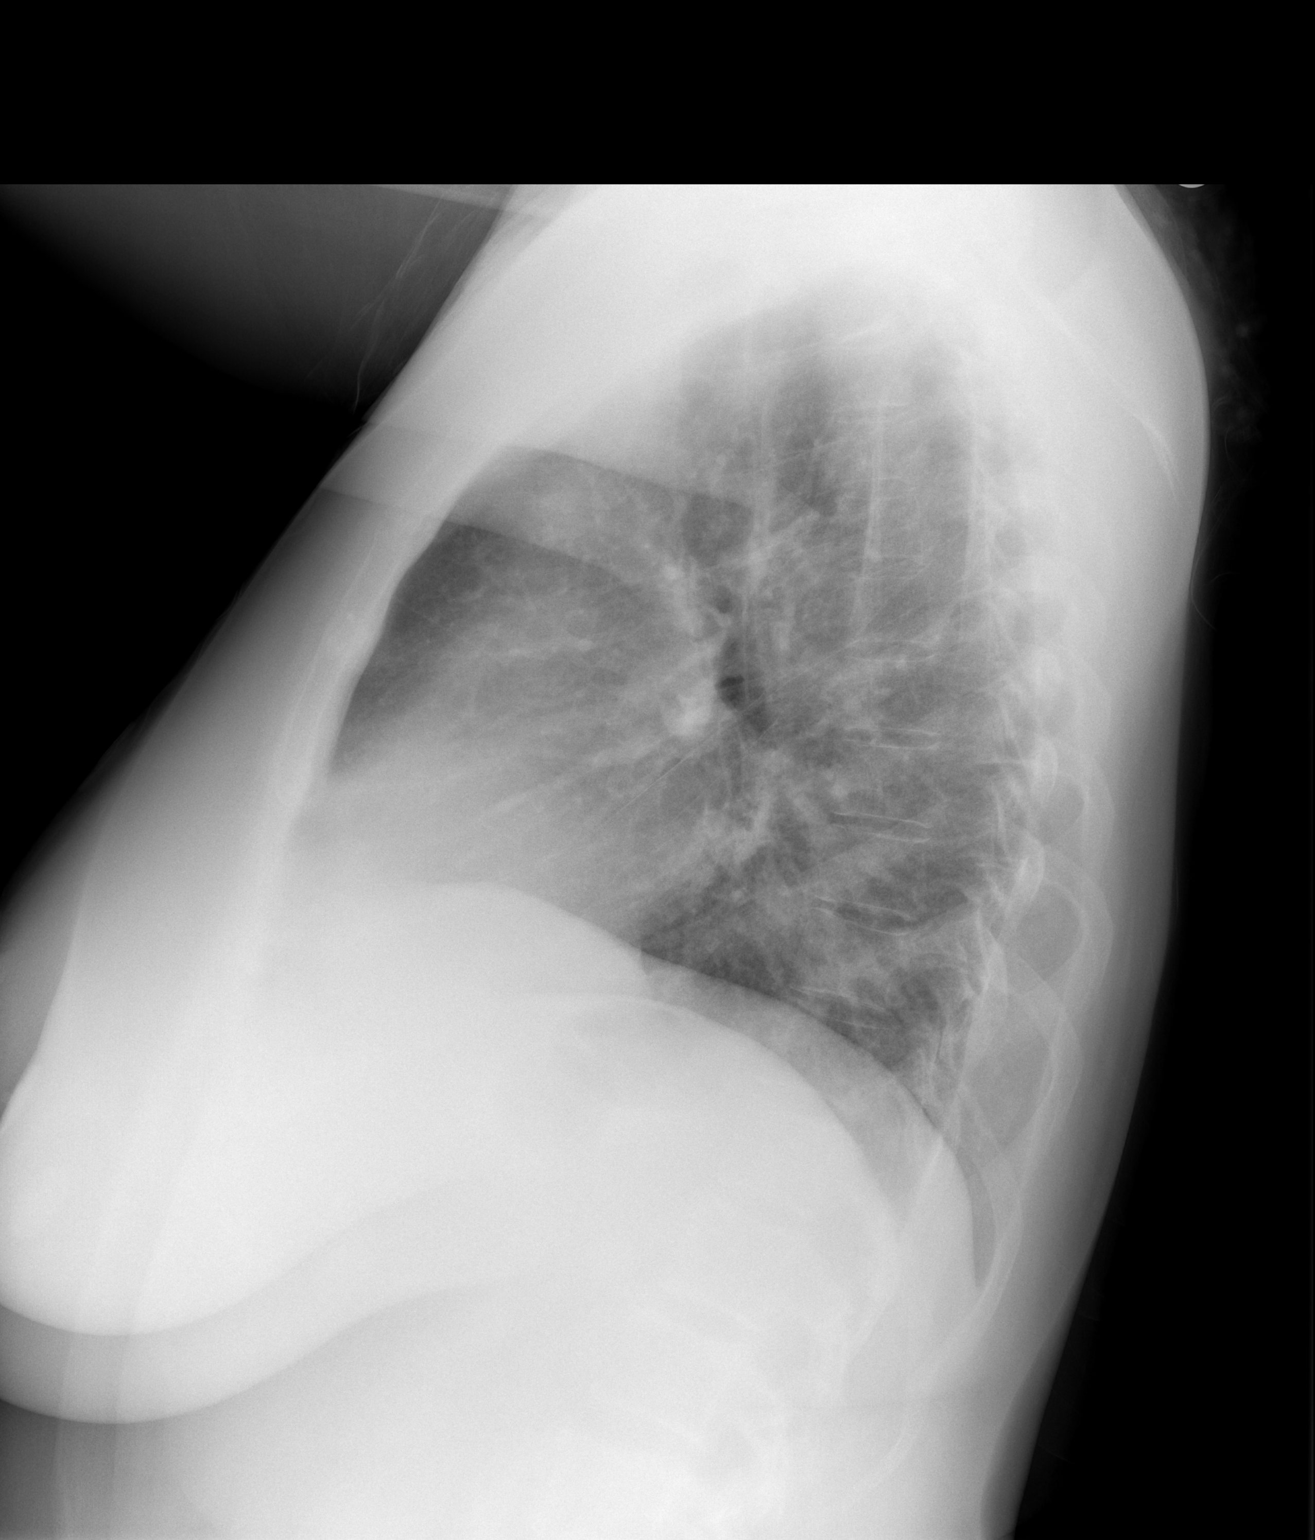

[2 of 2 positions shown; findings below may reference images not displayed]

FINDINGS: Heart size is normal. Lung volumes are low. Mild bibasilar
atelectasis is present. No significant airspace consolidation is
present. There is no edema or effusion.
IMPRESSION: Low lung volumes and mild bibasilar atelectasis. No acute
cardiopulmonary disease.

## 2020-05-05 IMAGING — MR MR ABDOMEN WO/W CM
9 of 18 series · 21 of 48 positions shown · IV contrast (gadavist)
Comparison: Ultrasound abdomen, 12/31/2018 and CT abdomen
12/31/2018.

CLINICAL DATA: Followup multiple liver lesions.

EXAM:
MRI ABDOMEN WITHOUT AND WITH CONTRAST
TECHNIQUE: Multiplanar multisequence MR imaging of the abdomen was performed
both before and after the administration of intravenous contrast.
CONTRAST:  10 cc Gadavist

[Series 3: T2 fat-sat · axial · 5.4mm · 0.78mm/px · z∈[-138,+180]mm · 2 of 60 slices shown]
[im 1/60]
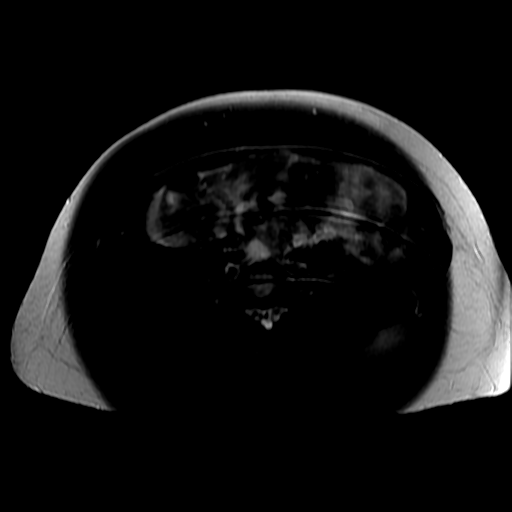
[im 60/60]
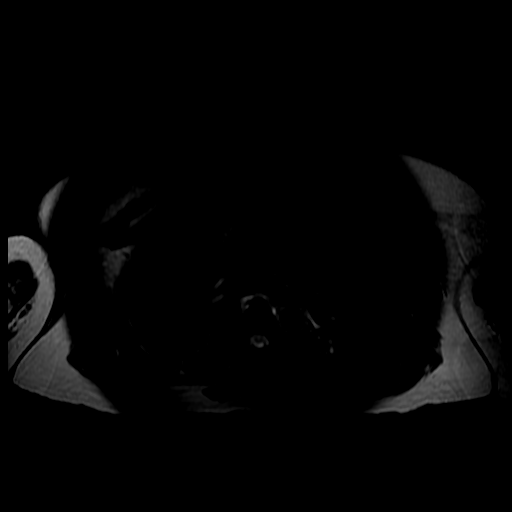

[Series 4: DWI b500 · axial · 6.0mm · 1.48mm/px · z∈[-150,+185]mm · 3 of 88 slices shown]
[im 1/88]
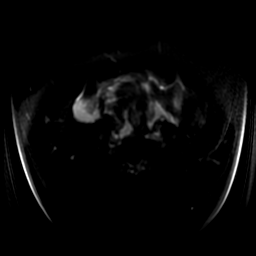
[im 44/88]
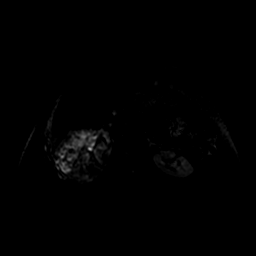
[im 88/88]
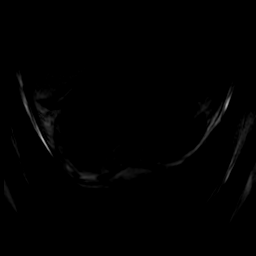

[Series 5: T2 · axial · 5.0mm · 0.78mm/px · z∈[-152,+188]mm · 2 of 69 slices shown (1 of 2)]
[im 1/69]
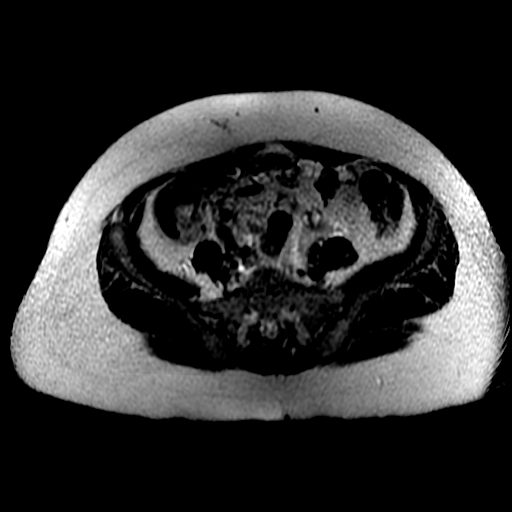
[im 69/69]
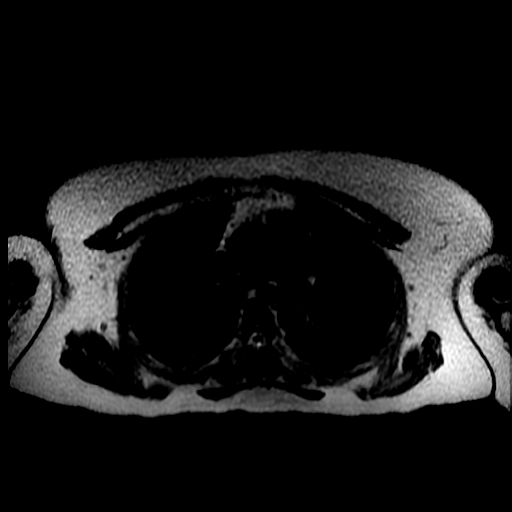

[Series 6: T2 · coronal · 5.0mm · 0.78mm/px · 1 of 45 slices shown (2 of 2)]
[im 1/45]
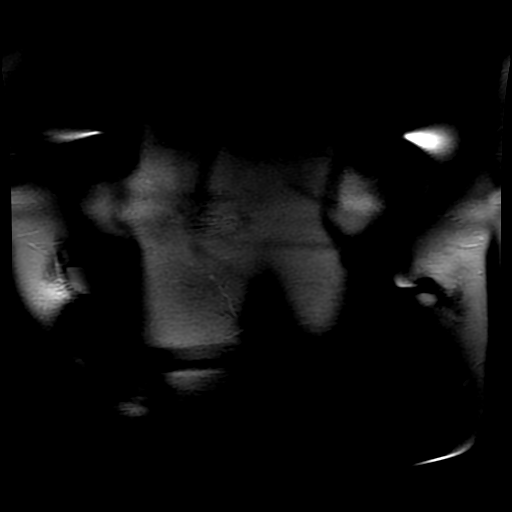

[Series 7: bSSFP · axial · 5.0mm · 0.78mm/px · z∈[-152,+188]mm · 2 of 69 slices shown]
[im 1/69]
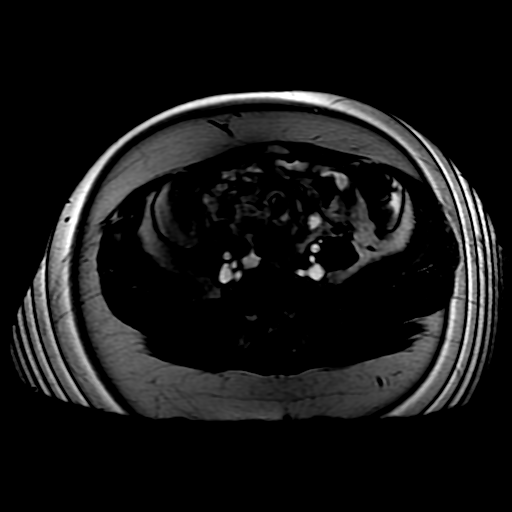
[im 69/69]
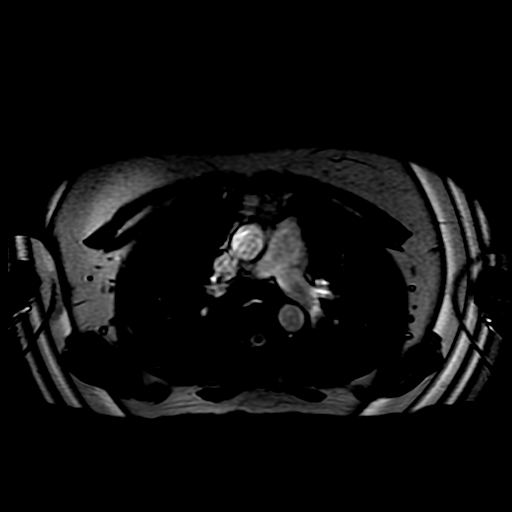

[Series 8: ax dualecho bh · axial · 5.0mm · 0.78mm/px · z∈[-152,+188]mm · 4 of 138 slices shown]
[im 1/138]
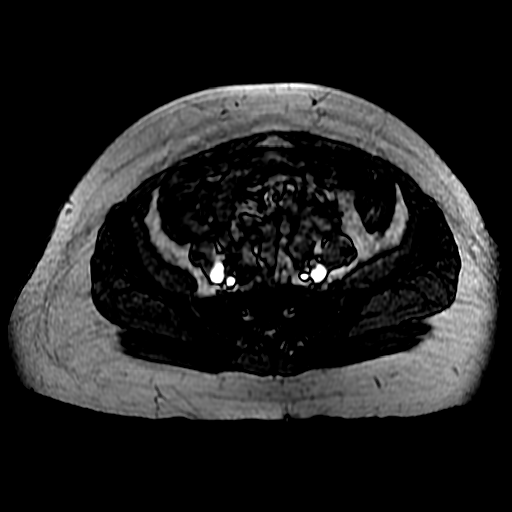
[im 46/138]
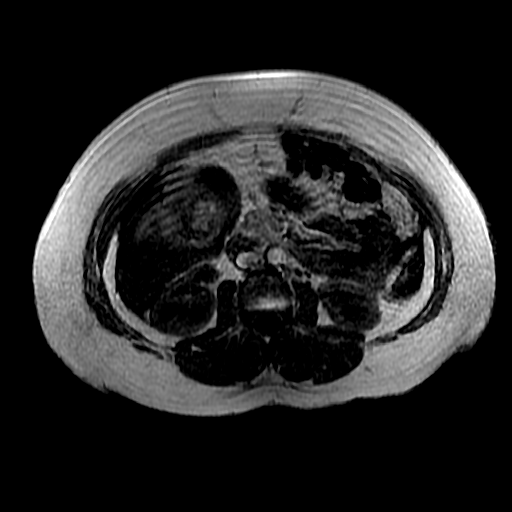
[im 92/138]
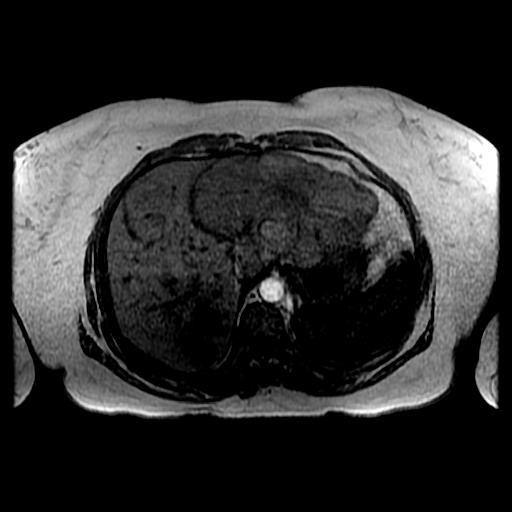
[im 138/138]
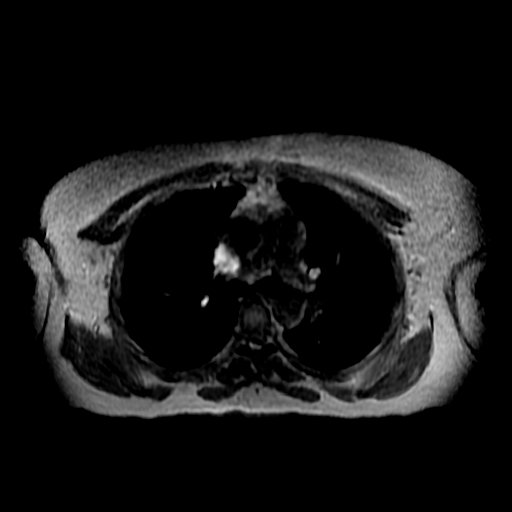

[Series 400: DWI · axial · 6.0mm · 1.48mm/px · 1 of 44 slices shown]
[im 1/44]
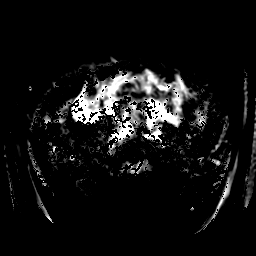

[Series 900: T1 dynamic · axial · 6.0mm · 0.78mm/px · z∈[-130,+179]mm · 3 of 104 slices shown (1 of 2)]
[im 1/104]
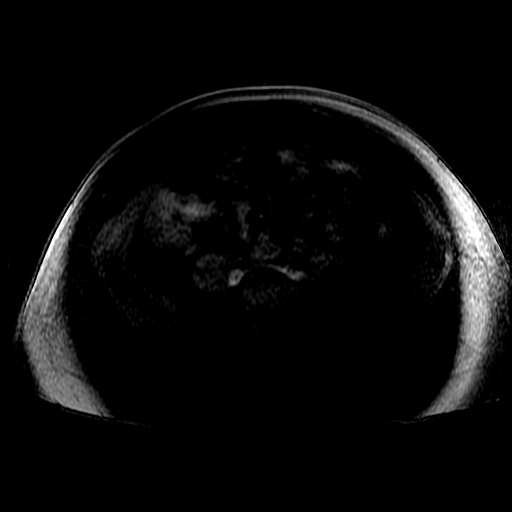
[im 52/104]
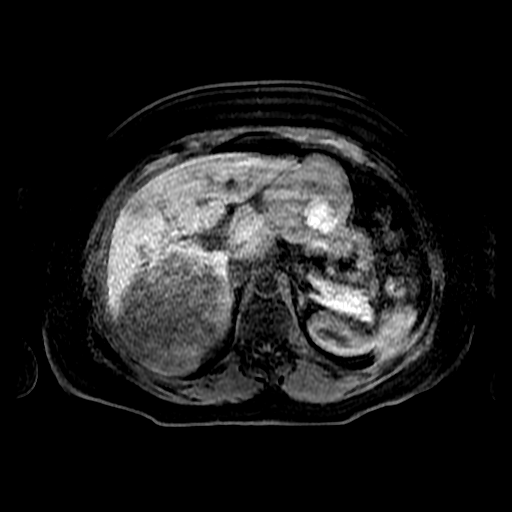
[im 104/104]
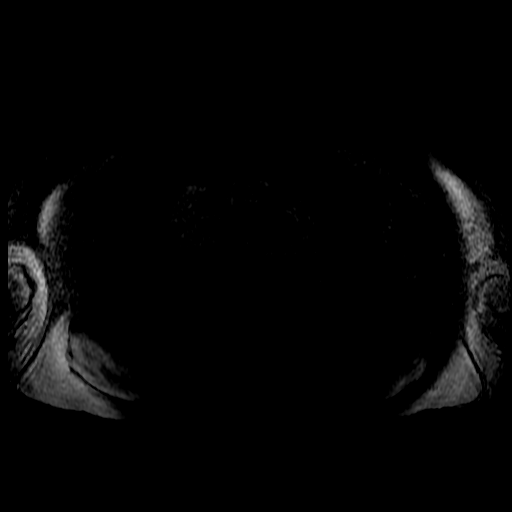

[Series 901: T1 dynamic · axial · 6.0mm · 0.78mm/px · z∈[-130,+179]mm · 3 of 104 slices shown (2 of 2)]
[im 1/104]
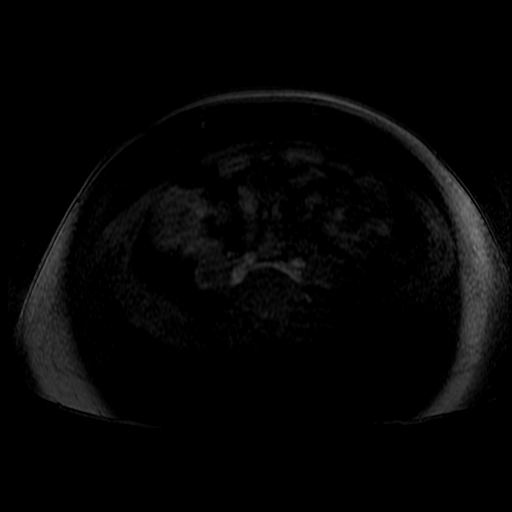
[im 52/104]
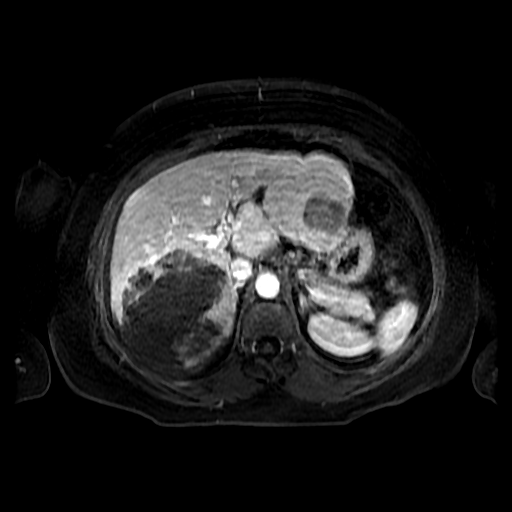
[im 104/104]
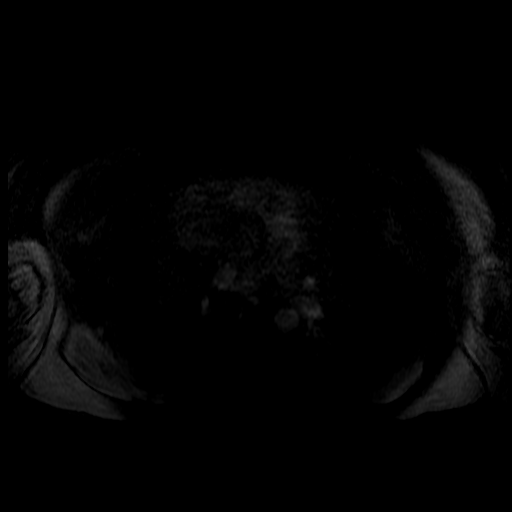

[21 of 48 positions shown; findings below may reference images not displayed]

FINDINGS: Lower chest: Bibasilar atelectasis is noted. No obvious pulmonary
lesions. An 8 mm right lower lobe pulmonary nodule was noted on the
CT scan. The distal esophagus is grossly normal.

Hepatobiliary: As demonstrated on the ultrasound and CT scan there
are innumerable large masses throughout both lobes of the liver.

8 cm segment 8 liver lesion appears to have a central scar. Similar
appearance of the 9 cm segment 3 lesion which has central necrosis
and hemorrhage.

9.5 cm segment 6 lesion demonstrates extensive necrosis with
variable areas of hemorrhage.

Gallstones are again noted in the gallbladder but no definite
findings for acute cholecystitis.

Pancreas:  No mass, inflammation or ductal dilatation.

Spleen:  Normal size.  No focal lesions.

Adrenals/Urinary Tract: The adrenal glands and kidneys are
unremarkable. Simple left renal cyst is noted.

Stomach/Bowel: No evidence of a gastric mass. The small bowel and
colon are grossly normal. No obvious colon cancer is demonstrated on
this exam or the CT scan.

Vascular/Lymphatic: The aorta and branch vessels are patent. The
major venous structures are patent. No mesenteric mass or
adenopathy. No retroperitoneal adenopathy.

Other:  No ascites or abdominal wall hernia.

Musculoskeletal: No significant bony findings.
IMPRESSION: 1. Numerous large masses throughout both lobes of the liver, likely
metastatic disease but possible multifocal primary hepatic tumors.
No obvious primary neoplasm is identified. Specifically, I do not
see any evidence of a gastric or pancreatic tumor or colon cancer.
No obvious breast masses.
2. Recommend consideration for ultrasound-guided liver biopsy and
chest CT to exclude a pulmonary neoplasm.

## 2020-09-08 DIAGNOSIS — R918 Other nonspecific abnormal finding of lung field: Secondary | ICD-10-CM | POA: Diagnosis not present

## 2020-09-08 DIAGNOSIS — D134 Benign neoplasm of liver: Secondary | ICD-10-CM | POA: Diagnosis not present

## 2020-09-08 DIAGNOSIS — K7689 Other specified diseases of liver: Secondary | ICD-10-CM | POA: Diagnosis not present

## 2021-03-26 DIAGNOSIS — R918 Other nonspecific abnormal finding of lung field: Secondary | ICD-10-CM | POA: Diagnosis not present

## 2021-03-26 DIAGNOSIS — R222 Localized swelling, mass and lump, trunk: Secondary | ICD-10-CM | POA: Diagnosis not present

## 2021-03-26 DIAGNOSIS — D1771 Benign lipomatous neoplasm of kidney: Secondary | ICD-10-CM | POA: Diagnosis not present

## 2021-03-26 DIAGNOSIS — N2889 Other specified disorders of kidney and ureter: Secondary | ICD-10-CM | POA: Diagnosis not present

## 2021-03-26 DIAGNOSIS — D134 Benign neoplasm of liver: Secondary | ICD-10-CM | POA: Diagnosis not present

## 2021-03-26 DIAGNOSIS — K7689 Other specified diseases of liver: Secondary | ICD-10-CM | POA: Diagnosis not present

## 2023-02-02 ENCOUNTER — Encounter (HOSPITAL_BASED_OUTPATIENT_CLINIC_OR_DEPARTMENT_OTHER): Payer: Self-pay | Admitting: Emergency Medicine

## 2023-02-02 ENCOUNTER — Emergency Department (HOSPITAL_BASED_OUTPATIENT_CLINIC_OR_DEPARTMENT_OTHER): Payer: Medicaid Other

## 2023-02-02 ENCOUNTER — Inpatient Hospital Stay (HOSPITAL_BASED_OUTPATIENT_CLINIC_OR_DEPARTMENT_OTHER)
Admission: EM | Admit: 2023-02-02 | Discharge: 2023-02-10 | DRG: 358 | Disposition: A | Discharge status: Home / Self Care | Payer: Medicaid Other | Attending: Internal Medicine | Admitting: Internal Medicine

## 2023-02-02 ENCOUNTER — Other Ambulatory Visit: Payer: Self-pay

## 2023-02-02 DIAGNOSIS — R791 Abnormal coagulation profile: Secondary | ICD-10-CM | POA: Diagnosis not present

## 2023-02-02 DIAGNOSIS — R748 Abnormal levels of other serum enzymes: Secondary | ICD-10-CM | POA: Diagnosis present

## 2023-02-02 DIAGNOSIS — K5651 Intestinal adhesions [bands], with partial obstruction: Principal | ICD-10-CM | POA: Diagnosis present

## 2023-02-02 DIAGNOSIS — K56600 Partial intestinal obstruction, unspecified as to cause: Principal | ICD-10-CM | POA: Diagnosis present

## 2023-02-02 DIAGNOSIS — D1803 Hemangioma of intra-abdominal structures: Secondary | ICD-10-CM

## 2023-02-02 DIAGNOSIS — Z833 Family history of diabetes mellitus: Secondary | ICD-10-CM

## 2023-02-02 DIAGNOSIS — E876 Hypokalemia: Secondary | ICD-10-CM | POA: Diagnosis present

## 2023-02-02 DIAGNOSIS — Z4682 Encounter for fitting and adjustment of non-vascular catheter: Secondary | ICD-10-CM | POA: Diagnosis not present

## 2023-02-02 DIAGNOSIS — I10 Essential (primary) hypertension: Secondary | ICD-10-CM | POA: Diagnosis not present

## 2023-02-02 DIAGNOSIS — K6389 Other specified diseases of intestine: Secondary | ICD-10-CM | POA: Diagnosis not present

## 2023-02-02 DIAGNOSIS — K7689 Other specified diseases of liver: Secondary | ICD-10-CM | POA: Diagnosis present

## 2023-02-02 DIAGNOSIS — Z6831 Body mass index (BMI) 31.0-31.9, adult: Secondary | ICD-10-CM | POA: Diagnosis not present

## 2023-02-02 DIAGNOSIS — Z803 Family history of malignant neoplasm of breast: Secondary | ICD-10-CM

## 2023-02-02 DIAGNOSIS — Y842 Radiological procedure and radiotherapy as the cause of abnormal reaction of the patient, or of later complication, without mention of misadventure at the time of the procedure: Secondary | ICD-10-CM | POA: Diagnosis present

## 2023-02-02 DIAGNOSIS — Z8249 Family history of ischemic heart disease and other diseases of the circulatory system: Secondary | ICD-10-CM

## 2023-02-02 DIAGNOSIS — K5939 Other megacolon: Secondary | ICD-10-CM | POA: Diagnosis not present

## 2023-02-02 DIAGNOSIS — R509 Fever, unspecified: Secondary | ICD-10-CM | POA: Diagnosis not present

## 2023-02-02 DIAGNOSIS — E669 Obesity, unspecified: Secondary | ICD-10-CM | POA: Diagnosis not present

## 2023-02-02 DIAGNOSIS — R58 Hemorrhage, not elsewhere classified: Secondary | ICD-10-CM | POA: Diagnosis not present

## 2023-02-02 DIAGNOSIS — K5669 Other partial intestinal obstruction: Secondary | ICD-10-CM | POA: Diagnosis present

## 2023-02-02 DIAGNOSIS — Z79899 Other long term (current) drug therapy: Secondary | ICD-10-CM

## 2023-02-02 DIAGNOSIS — L409 Psoriasis, unspecified: Secondary | ICD-10-CM | POA: Diagnosis present

## 2023-02-02 DIAGNOSIS — Z800 Family history of malignant neoplasm of digestive organs: Secondary | ICD-10-CM

## 2023-02-02 DIAGNOSIS — R109 Unspecified abdominal pain: Secondary | ICD-10-CM | POA: Diagnosis not present

## 2023-02-02 DIAGNOSIS — F411 Generalized anxiety disorder: Secondary | ICD-10-CM | POA: Diagnosis not present

## 2023-02-02 DIAGNOSIS — D134 Benign neoplasm of liver: Secondary | ICD-10-CM | POA: Diagnosis present

## 2023-02-02 DIAGNOSIS — R5081 Fever presenting with conditions classified elsewhere: Secondary | ICD-10-CM | POA: Diagnosis not present

## 2023-02-02 DIAGNOSIS — Z1152 Encounter for screening for COVID-19: Secondary | ICD-10-CM

## 2023-02-02 DIAGNOSIS — K769 Liver disease, unspecified: Secondary | ICD-10-CM | POA: Diagnosis not present

## 2023-02-02 DIAGNOSIS — Z22322 Carrier or suspected carrier of Methicillin resistant Staphylococcus aureus: Secondary | ICD-10-CM

## 2023-02-02 DIAGNOSIS — K56609 Unspecified intestinal obstruction, unspecified as to partial versus complete obstruction: Secondary | ICD-10-CM

## 2023-02-02 HISTORY — DX: Neoplasm of unspecified behavior of digestive system: D49.0

## 2023-02-02 LAB — COMPREHENSIVE METABOLIC PANEL
ALT: 19 U/L (ref 0–44)
AST: 21 U/L (ref 15–41)
Albumin: 3.6 g/dL (ref 3.5–5.0)
Alkaline Phosphatase: 263 U/L — ABNORMAL HIGH (ref 38–126)
Anion gap: 11 (ref 5–15)
BUN: 19 mg/dL (ref 6–20)
CO2: 20 mmol/L — ABNORMAL LOW (ref 22–32)
Calcium: 9 mg/dL (ref 8.9–10.3)
Chloride: 105 mmol/L (ref 98–111)
Creatinine, Ser: 0.66 mg/dL (ref 0.44–1.00)
GFR, Estimated: >60 mL/min (ref >60)
Glucose, Bld: 128 mg/dL — ABNORMAL HIGH (ref 70–99)
Potassium: 3.9 mmol/L (ref 3.5–5.1)
Sodium: 136 mmol/L (ref 135–145)
Total Bilirubin: 0.9 mg/dL (ref 0.3–1.2)
Total Protein: 7.6 g/dL (ref 6.5–8.1)

## 2023-02-02 LAB — URINALYSIS, ROUTINE W REFLEX MICROSCOPIC
Bacteria, UA: NONE SEEN
Bilirubin Urine: NEGATIVE
Glucose, UA: NEGATIVE mg/dL
Ketones, ur: 20 mg/dL — AB
Nitrite: NEGATIVE
Protein, ur: NEGATIVE mg/dL
Specific Gravity, Urine: >1.046 — ABNORMAL HIGH (ref 1.005–1.030)
pH: 5 (ref 5.0–8.0)

## 2023-02-02 LAB — CBC
HCT: 36.9 % (ref 36.0–46.0)
Hemoglobin: 12 g/dL (ref 12.0–15.0)
MCH: 28.9 pg (ref 26.0–34.0)
MCHC: 32.5 g/dL (ref 30.0–36.0)
MCV: 88.9 fL (ref 80.0–100.0)
Platelets: 406 10*3/uL — ABNORMAL HIGH (ref 150–400)
RBC: 4.15 MIL/uL (ref 3.87–5.11)
RDW: 12.2 % (ref 11.5–15.5)
WBC: 10.9 10*3/uL — ABNORMAL HIGH (ref 4.0–10.5)
nRBC: 0 % (ref 0.0–0.2)

## 2023-02-02 LAB — LIPASE, BLOOD: Lipase: 24 U/L (ref 11–51)

## 2023-02-02 MED ORDER — IOHEXOL 300 MG/ML  SOLN
100.0000 mL | Freq: Once | INTRAMUSCULAR | Status: AC | PRN
  Administered 2023-02-02: 100 mL via INTRAVENOUS

## 2023-02-02 MED ORDER — ONDANSETRON HCL 4 MG/2ML IJ SOLN
4.0000 mg | Freq: Four times a day (QID) | INTRAMUSCULAR | Status: DC | PRN
  Administered 2023-02-02 – 2023-02-09 (×5): 4 mg via INTRAVENOUS
  Filled 2023-02-02 (×5): qty 2

## 2023-02-02 MED ORDER — SODIUM CHLORIDE 0.9 % IV SOLN: Freq: Once | INTRAVENOUS | Status: AC

## 2023-02-02 MED ORDER — MORPHINE SULFATE (PF) 4 MG/ML IV SOLN
4.0000 mg | Freq: Once | INTRAVENOUS | Status: AC
  Administered 2023-02-02: 4 mg via INTRAVENOUS
  Filled 2023-02-02: qty 1

## 2023-02-02 MED ORDER — ONDANSETRON HCL 4 MG/2ML IJ SOLN
4.0000 mg | Freq: Once | INTRAMUSCULAR | Status: AC
  Administered 2023-02-02: 4 mg via INTRAVENOUS
  Filled 2023-02-02: qty 2

## 2023-02-02 MED ORDER — SODIUM CHLORIDE 0.9 % IV BOLUS
1000.0000 mL | Freq: Once | INTRAVENOUS | Status: AC
  Administered 2023-02-02: 1000 mL via INTRAVENOUS

## 2023-02-02 MED ORDER — DIATRIZOATE MEGLUMINE & SODIUM 66-10 % PO SOLN
90.0000 mL | Freq: Once | ORAL | Status: AC
  Administered 2023-02-02: 90 mL via NASOGASTRIC
  Filled 2023-02-02: qty 90

## 2023-02-02 MED ORDER — PROCHLORPERAZINE EDISYLATE 10 MG/2ML IJ SOLN
10.0000 mg | Freq: Four times a day (QID) | INTRAMUSCULAR | Status: DC | PRN
  Administered 2023-02-03: 10 mg via INTRAVENOUS
  Filled 2023-02-02 (×2): qty 2

## 2023-02-02 MED ORDER — MORPHINE SULFATE (PF) 4 MG/ML IV SOLN
4.0000 mg | INTRAVENOUS | Status: DC | PRN
  Administered 2023-02-02 – 2023-02-09 (×19): 4 mg via INTRAVENOUS
  Filled 2023-02-02 (×19): qty 1

## 2023-02-02 NOTE — Consult Note (Signed)
Reason for Consult:small bowel obstruction Referring Physician: Dr. Cherylann Ratel  Jodi Forbes is an 57 y.o. female.  HPI: This is a 57 year old female with a past medical history significant for benign hepatic adenomas who has had required hepatectomy's on several occasions by hepatobiliary surgeons in [place].  She was originally diagnosed here in 2020 and then transferred to [facility] for initial surgery.  She has had both left and right hepatectomy's as well as a cholecystectomy.  She has no previous history of bowel obstructions.  Yesterday she started getting cramping abdominal pain.  She reports she has had this in the past but is always resolved on its own.  Her pain got more severe this morning and she developed nausea and vomiting.  Her last bowel movement was last evening.  She underwent a CT scan showing a dilated loop of small bowel in the central abdomen with a transition point.  This was worrisome for a possible developing bowel obstruction.  The CT scan also showed multiple large liver lesions which are unchanged.  Her last follow-up in [place] was in late 2022 and she is due for another follow-up.  She denies fevers or chills.  She has not had any diarrhea.  She is more comfortable now that a nasogastric tube is in place and has minimal discomfort  Past Medical History:  Diagnosis Date   Medical history non-contributory    Psoriasis 2013   bx'd by dermatology per report   Tumor liver     Past Surgical History:  Procedure Laterality Date   CHOLECYSTECTOMY     IR US GUIDE BX ASP/DRAIN  01/01/2019   TUMOR REMOVAL      Family History  Problem Relation Age of Onset   Diabetes Mother    CAD Father    Breast cancer Maternal Grandmother    Colon cancer Maternal Grandfather    Pancreatic cancer Other        Maternal Great Uncle    Social History:  reports that she has never smoked. She has never used smokeless tobacco. She reports that she does not drink alcohol and  does not use drugs.  Allergies: No Known Allergies  Medications: I have reviewed the patient's current medications.  Results for orders placed or performed during the hospital encounter of 02/02/23 (from the past 48 hour(s))  Lipase, blood     Status: None   Collection Time: 02/02/23 12:54 PM  Result Value Ref Range   Lipase 24 11 - 51 U/L    Comment: Performed at [facility], [address] [place], [place] [address]  Comprehensive metabolic panel     Status: Abnormal   Collection Time: 02/02/23 12:54 PM  Result Value Ref Range   Sodium 136 135 - 145 mmol/L   Potassium 3.9 3.5 - 5.1 mmol/L   Chloride 105 98 - 111 mmol/L   CO2 20 (L) 22 - 32 mmol/L   Glucose, Bld 128 (H) 70 - 99 mg/dL    Comment: Glucose reference range applies only to samples taken after fasting for at least 8 hours.   BUN 19 6 - 20 mg/dL   Creatinine, Ser 0.66 0.44 - 1.00 mg/dL   Calcium 9.0 8.9 - 10.3 mg/dL   Total Protein 7.6 6.5 - 8.1 g/dL   Albumin 3.6 3.5 - 5.0 g/dL   AST 21 15 - 41 U/L   ALT 19 0 - 44 U/L   Alkaline Phosphatase 263 (H) 38 - 126 U/L   Total  Bilirubin 0.9 0.3 - 1.2 mg/dL   GFR, Estimated >60 >60 mL/min    Comment: (NOTE) Calculated using the CKD-EPI Creatinine Equation (2021)    Anion gap 11 5 - 15    Comment: Performed at [facility], [address] [place], [place] [address]  CBC     Status: Abnormal   Collection Time: 02/02/23 12:54 PM  Result Value Ref Range   WBC 10.9 (H) 4.0 - 10.5 K/uL   RBC 4.15 3.87 - 5.11 MIL/uL   Hemoglobin 12.0 12.0 - 15.0 g/dL   HCT 36.9 36.0 - 46.0 %   MCV 88.9 80.0 - 100.0 fL   MCH 28.9 26.0 - 34.0 pg   MCHC 32.5 30.0 - 36.0 g/dL   RDW 12.2 11.5 - 15.5 %   Platelets 406 (H) 150 - 400 K/uL   nRBC 0.0 0.0 - 0.2 %    Comment: Performed at [facility], [address] [place], [place] [address]    DG Abdomen 1 View  Result Date: 02/02/2023 CLINICAL DATA:  NG tube placement EXAM: ABDOMEN - 1 VIEW COMPARISON:   02/02/23 FINDINGS: 0 there is been interval placement of in the anterior to. The tip and side port are well below the GE junction. Tip is in the expected location of the distal stomach. Dilated loop of small bowel is noted within the imaged portion of the mid abdomen. IMPRESSION: Interval placement of NG tube with tip in the distal stomach. Electronically Signed   By: Kerby Moors M.D.   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 EXAM: CT ABDOMEN AND PELVIS WITH CONTRAST TECHNIQUE: Multidetector CT imaging of the abdomen and pelvis was performed using the standard protocol following bolus administration of intravenous contrast. RADIATION DOSE REDUCTION: This exam was performed according to the departmental dose-optimization program which includes automated exposure control, adjustment of the mA and/or kV according to patient size and/or use of iterative reconstruction technique. CONTRAST:  176m OMNIPAQUE IOHEXOL 300 MG/ML  SOLN COMPARISON:  03/26/2021 FINDINGS: Lower chest: No acute abnormality. Hepatobiliary: Multiple large, hypodense liver lesions are not appreciably changed, largest again in the liver dome measuring 12.8 x 9.9 cm (series 2, image 16). Evidence of prior left and right partial resections. Status post cholecystectomy. No biliary ductal dilatation. Pancreas: Unremarkable. No pancreatic ductal dilatation or surrounding inflammatory changes. Spleen: Normal in size without significant abnormality. Adrenals/Urinary Tract: Adrenal glands are unremarkable. Kidneys are normal, without renal calculi, solid lesion, or hydronephrosis. Bladder is unremarkable. Stomach/Bowel: Stomach is within normal limits. Appendix appears normal. Air and fluid-filled loop of mid small bowel in the central left hemiabdomen, with an abrupt transition point in the midline ventral  abdomen (series 2, image 57, series 5, image 67). This loop measures no greater than 3.0 cm in caliber but appears mildly distended. Vascular/Lymphatic: No significant vascular findings are present. No enlarged abdominal or pelvic lymph nodes. Reproductive: Uterine fibroids. Other: No abdominal wall hernia or abnormality. No ascites. Musculoskeletal: No acute or significant osseous findings. IMPRESSION: 1. Air and fluid-filled loop of mid small bowel in the central left hemiabdomen, with an abrupt transition point in the midline ventral abdomen. This loop measures no greater than 3.0 cm in caliber but appears mildly distended. Findings are consistent with partial or developing small bowel obstruction. 2. Multiple large, hypodense liver lesions are not  appreciably changed, largest again in the liver dome measuring 12.8 x 9.9 cm. Evidence of prior left and right partial resections. By report, these have been previously characterized as hepatic adenomas, and there is no evidence of complicating hemorrhage or other acute associated finding at this time. 3. Uterine fibroids. Electronically Signed   By: Delanna Ahmadi M.D.   On: 02/02/2023 14:33    Review of Systems  Constitutional:  Negative for fever.  Respiratory:  Negative for cough and shortness of breath.   Genitourinary:  Negative for dysuria.  All other systems reviewed and are negative.  Blood pressure 133/84, pulse 82, temperature 98.4 F (36.9 C), temperature source Oral, resp. rate 16, height '5\' 4"'$  (1.626 m), weight 86.2 kg, SpO2 100 %. Physical Exam Constitutional:      Appearance: She is well-developed. She is not ill-appearing.  HENT:     Head: Normocephalic and atraumatic.  Eyes:     Extraocular Movements: Extraocular movements intact.  Cardiovascular:     Rate and Rhythm: Normal rate and regular rhythm.  Pulmonary:     Effort: Pulmonary effort is normal.     Breath sounds: Normal breath sounds.  Abdominal:     Comments: Abdomen is  obese.  There is a well-healed midline incision.  There is minimal abdominal tenderness.  There is fullness with the liver but very subtle.  There are no hernias  Skin:    General: Skin is warm and dry.  Neurological:     General: No focal deficit present.     Mental Status: She is alert.  Psychiatric:        Mood and Affect: Mood is not anxious.        Behavior: Behavior normal.     Assessment/Plan: Small bowel obstruction Hepatic adenomas  I have reviewed her CT scan and her notes in the electronic medical records.  This appears to be an early developing small bowel obstruction from adhesions although we cannot completely rule out a gastroenteritis.  A nasogastric tube has been placed and the smaller protocol has been ordered.  I discussed the diagnosis with her in detail.  Plan will be to try conservative management with bowel rest and nasogastric suctioning as well as IV fluids.  We will follow the protocol films and hopefully this will improve without the need for further surgery.  She understands and agrees with the plans.  Moderately complex medical decision making  Coralie Keens MD 02/02/2023, 6:48 PM

## 2023-02-02 NOTE — ED Notes (Signed)
Called [facility] for transport at 3:25.

## 2023-02-02 NOTE — ED Notes (Signed)
X-ray at bedside

## 2023-02-02 NOTE — ED Triage Notes (Signed)
Pt c/o Abdominal pain and back pain onset about 3 am. Pt with history of same with findings of tumors on her liver. Pt actively vomiting in triage.

## 2023-02-02 NOTE — ED Notes (Signed)
[facility] at bedside 

## 2023-02-02 NOTE — H&P (Signed)
History and Physical    Patient: Jodi Forbes ZHG:[id number] DOB: 1966/10/28 DOA: 02/02/2023 DOS: the patient was seen and examined on 02/02/2023 PCP: Patient, No Pcp Per  Patient coming from: Home  Chief Complaint:  Chief Complaint  Patient presents with   Abdominal Pain   HPI: Jodi Forbes is a 57 y.o. female with medical history significant of hepatic adenoma. Presenting with abdominal pain. She was in her normal state of health until 3 am this morning. She felt sharp pain in her lower abdomen. It radiated to her back. She had N/V, but no D/F. She tried APAP, but immediately threw it up. When her symptoms did not improve this morning, she decided to come to the ED for evaluation. She denies any other aggravating or alleviating factors. .   Review of Systems: As mentioned in the history of present illness. All other systems reviewed and are negative. Past Medical History:  Diagnosis Date   Hepatic adenoma    Psoriasis 2013   bx'd by dermatology per report   Tumor liver    Past Surgical History:  Procedure Laterality Date   CHOLECYSTECTOMY     IR US GUIDE BX ASP/DRAIN  01/01/2019   TUMOR REMOVAL     Social History:  reports that she has never smoked. She has never used smokeless tobacco. She reports that she does not drink alcohol and does not use drugs.  No Known Allergies  Family History  Problem Relation Age of Onset   Diabetes Mother    CAD Father    Breast cancer Maternal Grandmother    Colon cancer Maternal Grandfather    Pancreatic cancer Other        Maternal Great Uncle    Prior to Admission medications   Medication Sig Start Date End Date Taking? Authorizing Provider  acetaminophen (TYLENOL) 325 MG tablet Take 2 tablets (650 mg total) by mouth every 6 (six) hours as needed for mild pain (or Fever >/= 101). 01/09/19   [name], MD  ceFEPIme 1 g in sodium chloride 0.9 % 100 mL Inject 1 g into the vein every 8 (eight) hours. 01/09/19   [name], MD  hydrALAZINE (APRESOLINE) 20 MG/ML injection Inject 0.5 mLs (10 mg total) into the vein every 6 (six) hours as needed (SBP more than 150). 01/09/19   [name], MD  HYDROmorphone (DILAUDID) 1 MG/ML injection Inject 0.5 mLs (0.5 mg total) into the vein every 2 (two) hours as needed for moderate pain. 01/09/19   [name], MD  levalbuterol Penne Lash) 0.63 MG/3ML nebulizer solution Take 3 mLs (0.63 mg total) by nebulization every 6 (six) hours as needed for wheezing or shortness of breath. 01/09/19   [name], MD  LORazepam (ATIVAN) 0.5 MG tablet Take 1 tablet (0.5 mg total) by mouth every 6 (six) hours as needed for anxiety. 01/09/19   [name], MD  mouth rinse LIQD solution 15 mLs by Mouth Rinse route 2 (two) times daily. 01/09/19   [name], MD  ondansetron (ZOFRAN) 4 MG tablet Take 1 tablet (4 mg total) by mouth every 6 (six) hours as needed for nausea. 01/09/19   [name], MD  oxyCODONE (OXY IR/ROXICODONE) 5 MG immediate release tablet Take 1-2 tablets (5-10 mg total) by mouth every 6 (six) hours as needed for moderate pain. 01/09/19   [name], MD  pantoprazole (PROTONIX) 40 MG injection Inject 40 mg into the vein every 12 (twelve) hours. 01/09/19   [name], MD  polyethylene glycol [facility] /  GLYCOLAX) packet Take 17 g by mouth daily as needed for mild constipation. 01/09/19   [name], MD  senna-docusate (SENOKOT-S) 8.6-50 MG tablet Take 1 tablet by mouth at bedtime. 01/09/19   [name], MD    Physical Exam: Vitals:   02/02/23 1402 02/02/23 1445 02/02/23 1500 02/02/23 1530  BP: 124/76  122/70 (!) 145/93  Pulse: 75 73 83 85  Resp: 17 15 (!) 21 18  Temp:    98.6 F (37 C)  TempSrc:      SpO2: 99% 93% 100% 98%  Weight:      Height:       General: 57 y.o. female resting in bed in NAD Eyes: PERRL, normal sclera ENMT: Nares patent w/o discharge, orophaynx clear, dentition normal, ears w/o discharge/lesions/ulcers Neck:  Supple, trachea midline Cardiovascular: RRR, +S1, S2, no m/g/r, equal pulses throughout Respiratory: CTABL, no w/r/r, normal WOB GI: BS+, mild distention, soft, global TTP, no masses noted, no organomegaly noted, NGT in place MSK: No e/c/c Neuro: A&O x 3, no focal deficits Psyc: Appropriate interaction and affect, calm/cooperative  Data Reviewed: {Tip this will not be part of the note when signed- Document your independent interpretation of telemetry tracing, EKG, lab, Radiology test or any other diagnostic tests. Add any new diagnostic test ordered today. (Optional):26781 Results for orders placed or performed during the hospital encounter of 02/02/23 (from the past 24 hour(s))  Lipase, blood     Status: None   Collection Time: 02/02/23 12:54 PM  Result Value Ref Range   Lipase 24 11 - 51 U/L  Comprehensive metabolic panel     Status: Abnormal   Collection Time: 02/02/23 12:54 PM  Result Value Ref Range   Sodium 136 135 - 145 mmol/L   Potassium 3.9 3.5 - 5.1 mmol/L   Chloride 105 98 - 111 mmol/L   CO2 20 (L) 22 - 32 mmol/L   Glucose, Bld 128 (H) 70 - 99 mg/dL   BUN 19 6 - 20 mg/dL   Creatinine, Ser 0.66 0.44 - 1.00 mg/dL   Calcium 9.0 8.9 - 10.3 mg/dL   Total Protein 7.6 6.5 - 8.1 g/dL   Albumin 3.6 3.5 - 5.0 g/dL   AST 21 15 - 41 U/L   ALT 19 0 - 44 U/L   Alkaline Phosphatase 263 (H) 38 - 126 U/L   Total Bilirubin 0.9 0.3 - 1.2 mg/dL   GFR, Estimated >60 >60 mL/min   Anion gap 11 5 - 15  CBC     Status: Abnormal   Collection Time: 02/02/23 12:54 PM  Result Value Ref Range   WBC 10.9 (H) 4.0 - 10.5 K/uL   RBC 4.15 3.87 - 5.11 MIL/uL   Hemoglobin 12.0 12.0 - 15.0 g/dL   HCT 36.9 36.0 - 46.0 %   MCV 88.9 80.0 - 100.0 fL   MCH 28.9 26.0 - 34.0 pg   MCHC 32.5 30.0 - 36.0 g/dL   RDW 12.2 11.5 - 15.5 %   Platelets 406 (H) 150 - 400 K/uL   nRBC 0.0 0.0 - 0.2 %    CT ab/pelvis 1. Air and fluid-filled loop of mid small bowel in the central left hemiabdomen, with an abrupt  transition point in the midline ventral abdomen. This loop measures no greater than 3.0 cm in caliber but appears mildly distended. Findings are consistent with partial or developing small bowel obstruction. 2. Multiple large, hypodense liver lesions are not appreciably changed, largest again in the liver dome measuring  12.8 x 9.9 cm. Evidence of prior left and right partial resections. By report, these have been previously characterized as hepatic adenomas, and there is no evidence of complicating hemorrhage or other acute associated finding at this time. 3. Uterine fibroids.  Assessment and Plan: pSBO     - admitted to inpt, med-surg     - NGT in place     - SBO protocol     - General surgery consulted; defer further plan to them  History of hepatic adenomas Elevated alk phos     - elevated lfts likely secondary to hepatic adenmonas; continue outpt follow up  Advance Care Planning:   Code Status: FULL  Consults: General Surgery  Family Communication: None at bedside  Severity of Illness: The appropriate patient status for this patient is INPATIENT. Inpatient status is judged to be reasonable and necessary in order to provide the required intensity of service to ensure the patient's safety. The patient's presenting symptoms, physical exam findings, and initial radiographic and laboratory data in the context of their chronic comorbidities is felt to place them at high risk for further clinical deterioration. Furthermore, it is not anticipated that the patient will be medically stable for discharge from the hospital within 2 midnights of admission.   * I certify that at the point of admission it is my clinical judgment that the patient will require inpatient hospital care spanning beyond 2 midnights from the point of admission due to high intensity of service, high risk for further deterioration and high frequency of surveillance required.*  Author: Jonnie Finner, DO 02/02/2023 4:42  PM  For on call review [email or web address].

## 2023-02-02 NOTE — ED Provider Notes (Signed)
[place] [facility] Provider Note   CSN: [id number] Arrival date & time: 02/02/23  1208     History  Chief Complaint  Patient presents with   Abdominal Pain    Jodi Forbes is a 57 y.o. female.  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  She is complaining of diffuse middle and lower abdominal pain that started around 3 AM this morning.  Feels similar to when one of her masses had ruptured.  She is nauseous and has vomited a few times.  No fevers or chills.  Having normal bowel movements, none today.  No urinary symptoms.  Surgeries were done in [place].  Has had some minor pain since the surgery and usually responds to Tylenol which she tried and did not help today.  The history is provided by the patient.  Abdominal Pain Pain location:  Epigastric, LLQ and RLQ Pain quality: aching   Pain radiates to:  Back Pain severity:  Severe Onset quality:  Sudden Duration:  11 hours Timing:  Constant Progression:  Unchanged Chronicity:  Recurrent Context: not trauma   Relieved by:  Nothing Worsened by:  Nothing Ineffective treatments:  Acetaminophen Associated symptoms: nausea and vomiting   Associated symptoms: no chest pain, no constipation, no cough, no diarrhea, no dysuria, no fever, no hematemesis, no hematochezia, no hematuria, no shortness of breath and no sore throat        Home Medications Prior to Admission medications   Medication Sig Start Date End Date Taking? Authorizing Provider  acetaminophen (TYLENOL) 325 MG tablet Take 2 tablets (650 mg total) by mouth every 6 (six) hours as needed for mild pain (or Fever >/= 101). 01/09/19   [name], MD  ceFEPIme 1 g in sodium chloride 0.9 % 100 mL Inject 1 g into the vein every 8 (eight) hours. 01/09/19   [name], MD  hydrALAZINE (APRESOLINE) 20 MG/ML injection Inject 0.5 mLs (10 mg total) into the vein every 6 (six) hours as needed (SBP more  than 150). 01/09/19   [name], MD  HYDROmorphone (DILAUDID) 1 MG/ML injection Inject 0.5 mLs (0.5 mg total) into the vein every 2 (two) hours as needed for moderate pain. 01/09/19   [name], MD  levalbuterol Penne Lash) 0.63 MG/3ML nebulizer solution Take 3 mLs (0.63 mg total) by nebulization every 6 (six) hours as needed for wheezing or shortness of breath. 01/09/19   [name], MD  LORazepam (ATIVAN) 0.5 MG tablet Take 1 tablet (0.5 mg total) by mouth every 6 (six) hours as needed for anxiety. 01/09/19   [name], MD  mouth rinse LIQD solution 15 mLs by Mouth Rinse route 2 (two) times daily. 01/09/19   [name], MD  ondansetron (ZOFRAN) 4 MG tablet Take 1 tablet (4 mg total) by mouth every 6 (six) hours as needed for nausea. 01/09/19   [name], MD  oxyCODONE (OXY IR/ROXICODONE) 5 MG immediate release tablet Take 1-2 tablets (5-10 mg total) by mouth every 6 (six) hours as needed for moderate pain. 01/09/19   [name], MD  pantoprazole (PROTONIX) 40 MG injection Inject 40 mg into the vein every 12 (twelve) hours. 01/09/19   [name], MD  polyethylene glycol [facility] / Floria Raveling) packet Take 17 g by mouth daily as needed for mild constipation. 01/09/19   [name], MD  senna-docusate (SENOKOT-S) 8.6-50 MG tablet Take 1 tablet by mouth at bedtime. 01/09/19   [name], MD  Allergies    Patient has no known allergies.    Review of Systems   Review of Systems  Constitutional:  Negative for fever.  HENT:  Negative for sore throat.   Respiratory:  Negative for cough and shortness of breath.   Cardiovascular:  Negative for chest pain.  Gastrointestinal:  Positive for abdominal pain, nausea and vomiting. Negative for constipation, diarrhea, hematemesis and hematochezia.  Genitourinary:  Negative for dysuria and hematuria.  Skin:  Negative for rash.    Physical Exam Updated Vital Signs BP (!) 166/124 (BP Location: Left Arm)   Pulse 89    Temp 98.2 F (36.8 C) (Oral)   Resp 20   Ht '5\' 4"'$  (1.626 m)   Wt 86.2 kg   SpO2 100%   BMI 32.61 kg/m  Physical Exam Vitals and nursing note reviewed.  Constitutional:      General: She is not in acute distress.    Appearance: Normal appearance. She is well-developed.  HENT:     Head: Normocephalic and atraumatic.  Eyes:     Conjunctiva/sclera: Conjunctivae normal.  Cardiovascular:     Rate and Rhythm: Normal rate and regular rhythm.     Heart sounds: No murmur heard. Pulmonary:     Effort: Pulmonary effort is normal. No respiratory distress.     Breath sounds: Normal breath sounds.  Abdominal:     Palpations: Abdomen is soft.     Tenderness: There is generalized abdominal tenderness. There is no guarding or rebound.  Musculoskeletal:        General: No swelling.     Cervical back: Neck supple.  Skin:    General: Skin is warm and dry.     Capillary Refill: Capillary refill takes less than 2 seconds.  Neurological:     General: No focal deficit present.     Mental Status: She is alert.  Psychiatric:        Mood and Affect: Mood normal.     ED Results / Procedures / Treatments   Labs (all labs ordered are listed, but only abnormal results are displayed) Labs Reviewed  COMPREHENSIVE METABOLIC PANEL - Abnormal; Notable for the following components:      Result Value   CO2 20 (*)    Glucose, Bld 128 (*)    Alkaline Phosphatase 263 (*)    All other components within normal limits  CBC - Abnormal; Notable for the following components:   WBC 10.9 (*)    Platelets 406 (*)    All other components within normal limits  LIPASE, BLOOD  URINALYSIS, ROUTINE W REFLEX MICROSCOPIC    EKG EKG Interpretation  Date/Time:  Sunday February 02 2023 13:00:03 EST Ventricular Rate:  75 PR Interval:  178 QRS Duration: 94 QT Interval:  391 QTC Calculation: 437 R Axis:   51 Text Interpretation: Sinus rhythm Borderline T abnormalities, anterior leads No significant change since  prior 1/20 Confirmed by Aletta Edouard [phone]) on 02/02/2023 1:03:35 PM  Radiology DG Abdomen 1 View  Result Date: 02/02/2023 CLINICAL DATA:  NG tube placement EXAM: ABDOMEN - 1 VIEW COMPARISON:  02/02/23 FINDINGS: 0 there is been interval placement of in the anterior to. The tip and side port are well below the GE junction. Tip is in the expected location of the distal stomach. Dilated loop of small bowel is noted within the imaged portion of the mid abdomen. IMPRESSION: Interval placement of NG tube with tip in the distal stomach. Electronically Signed   By: Kerby Moors  M.D.   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 EXAM: CT ABDOMEN AND PELVIS WITH CONTRAST TECHNIQUE: Multidetector CT imaging of the abdomen and pelvis was performed using the standard protocol following bolus administration of intravenous contrast. RADIATION DOSE REDUCTION: This exam was performed according to the departmental dose-optimization program which includes automated exposure control, adjustment of the mA and/or kV according to patient size and/or use of iterative reconstruction technique. CONTRAST:  124m OMNIPAQUE IOHEXOL 300 MG/ML  SOLN COMPARISON:  03/26/2021 FINDINGS: Lower chest: No acute abnormality. Hepatobiliary: Multiple large, hypodense liver lesions are not appreciably changed, largest again in the liver dome measuring 12.8 x 9.9 cm (series 2, image 16). Evidence of prior left and right partial resections. Status post cholecystectomy. No biliary ductal dilatation. Pancreas: Unremarkable. No pancreatic ductal dilatation or surrounding inflammatory changes. Spleen: Normal in size without significant abnormality. Adrenals/Urinary Tract: Adrenal glands are unremarkable. Kidneys are normal, without renal calculi, solid lesion, or hydronephrosis. Bladder is unremarkable.  Stomach/Bowel: Stomach is within normal limits. Appendix appears normal. Air and fluid-filled loop of mid small bowel in the central left hemiabdomen, with an abrupt transition point in the midline ventral abdomen (series 2, image 57, series 5, image 67). This loop measures no greater than 3.0 cm in caliber but appears mildly distended. Vascular/Lymphatic: No significant vascular findings are present. No enlarged abdominal or pelvic lymph nodes. Reproductive: Uterine fibroids. Other: No abdominal wall hernia or abnormality. No ascites. Musculoskeletal: No acute or significant osseous findings. IMPRESSION: 1. Air and fluid-filled loop of mid small bowel in the central left hemiabdomen, with an abrupt transition point in the midline ventral abdomen. This loop measures no greater than 3.0 cm in caliber but appears mildly distended. Findings are consistent with partial or developing small bowel obstruction. 2. Multiple large, hypodense liver lesions are not appreciably changed, largest again in the liver dome measuring 12.8 x 9.9 cm. Evidence of prior left and right partial resections. By report, these have been previously characterized as hepatic adenomas, and there is no evidence of complicating hemorrhage or other acute associated finding at this time. 3. Uterine fibroids. Electronically Signed   By: ADelanna AhmadiM.D.   On: 02/02/2023 14:33    Procedures Procedures    Medications Ordered in ED Medications  morphine (PF) 4 MG/ML injection 4 mg (has no administration in time range)  ondansetron (ZOFRAN) injection 4 mg (has no administration in time range)  sodium chloride 0.9 % bolus 1,000 mL (has no administration in time range)    ED Course/ Medical Decision Making/ A&P Clinical Course as of 02/02/23 1657  Sun Feb 02, 2023  1510 Discussed with Dr. BNinfa Lindengeneral surgery.  He has patient is a medical admission and have an NG tube placed.  They will see in consult.  I reviewed this with the patient  and she is in agreement for admission [MB]  1514 Discussed with [facility] hospitalist Dr. ASheppard Coilwho will put the patient in for a bed [MB]    Clinical Course User Index [MB] BHayden Rasmussen MD                             Medical Decision Making Amount and/or Complexity of Data Reviewed Labs: ordered. Radiology: ordered.  Risk Prescription drug management. Decision regarding hospitalization.   This patient complains of  abdominal pain nausea vomiting; this involves an extensive number of treatment Options and is a complaint that carries with it a high risk of complications and morbidity. The differential includes gastritis, peptic ulcer disease, perforation, obstruction, colitis, diverticulitis  I ordered, reviewed and interpreted labs, which included CBC with mildly elevated white count normal hemoglobin, chemistries with mildly low bicarb and elevated alk phos, lipase normal I ordered medication IV fluids pain medication nausea medication and reviewed PMP when indicated. I ordered imaging studies which included CT abdomen and pelvis and I independently    visualized and interpreted imaging which showed partial small bowel obstruction Additional history obtained from patient's family member Previous records obtained and reviewed in epic including prior CT scans showing her hepatic adenomas I consulted general surgery Dr. Ninfa Linden and [facility] hospitalist Dr. Sheppard Coil and discussed lab and imaging findings and discussed disposition.  Cardiac monitoring reviewed, normal sinus rhythm Social determinants considered, no significant barriers Critical Interventions: None  After the interventions stated above, I reevaluated the patient and found patient's pain to be improved Admission and further testing considered, she would benefit from mission to the hospital for serial exams and general surgery input on her partial small bowel obstruction.  Patient in agreement with  plan         Final Clinical Impression(s) / ED Diagnoses Final diagnoses:  Partial small bowel obstruction [facility])    Rx / DC Orders ED Discharge Orders     None         Hayden Rasmussen, MD 02/02/23 1659

## 2023-02-02 NOTE — ED Notes (Signed)
Report given to [facility] with [facility]

## 2023-02-03 ENCOUNTER — Inpatient Hospital Stay (HOSPITAL_COMMUNITY): Payer: Medicaid Other

## 2023-02-03 DIAGNOSIS — K56600 Partial intestinal obstruction, unspecified as to cause: Secondary | ICD-10-CM | POA: Diagnosis not present

## 2023-02-03 DIAGNOSIS — R748 Abnormal levels of other serum enzymes: Secondary | ICD-10-CM

## 2023-02-03 DIAGNOSIS — K5669 Other partial intestinal obstruction: Secondary | ICD-10-CM | POA: Diagnosis not present

## 2023-02-03 DIAGNOSIS — Z8719 Personal history of other diseases of the digestive system: Secondary | ICD-10-CM | POA: Diagnosis not present

## 2023-02-03 DIAGNOSIS — Z4682 Encounter for fitting and adjustment of non-vascular catheter: Secondary | ICD-10-CM | POA: Diagnosis not present

## 2023-02-03 DIAGNOSIS — D134 Benign neoplasm of liver: Secondary | ICD-10-CM

## 2023-02-03 LAB — COMPREHENSIVE METABOLIC PANEL
ALT: 35 U/L (ref 0–44)
AST: 44 U/L — ABNORMAL HIGH (ref 15–41)
Albumin: 3.5 g/dL (ref 3.5–5.0)
Alkaline Phosphatase: 230 U/L — ABNORMAL HIGH (ref 38–126)
Anion gap: 11 (ref 5–15)
BUN: 16 mg/dL (ref 6–20)
CO2: 23 mmol/L (ref 22–32)
Calcium: 9.1 mg/dL (ref 8.9–10.3)
Chloride: 103 mmol/L (ref 98–111)
Creatinine, Ser: 0.58 mg/dL (ref 0.44–1.00)
GFR, Estimated: >60 mL/min (ref >60)
Glucose, Bld: 118 mg/dL — ABNORMAL HIGH (ref 70–99)
Potassium: 3.6 mmol/L (ref 3.5–5.1)
Sodium: 137 mmol/L (ref 135–145)
Total Bilirubin: 1 mg/dL (ref 0.3–1.2)
Total Protein: 7.4 g/dL (ref 6.5–8.1)

## 2023-02-03 LAB — CBC
HCT: 37.3 % (ref 36.0–46.0)
Hemoglobin: 12 g/dL (ref 12.0–15.0)
MCH: 29.1 pg (ref 26.0–34.0)
MCHC: 32.2 g/dL (ref 30.0–36.0)
MCV: 90.3 fL (ref 80.0–100.0)
Platelets: 406 10*3/uL — ABNORMAL HIGH (ref 150–400)
RBC: 4.13 MIL/uL (ref 3.87–5.11)
RDW: 12.3 % (ref 11.5–15.5)
WBC: 12.4 10*3/uL — ABNORMAL HIGH (ref 4.0–10.5)
nRBC: 0 % (ref 0.0–0.2)

## 2023-02-03 LAB — HIV ANTIBODY (ROUTINE TESTING W REFLEX): HIV Screen 4th Generation wRfx: NONREACTIVE

## 2023-02-03 MED ORDER — SODIUM CHLORIDE 0.9 % IV SOLN: INTRAVENOUS | Status: AC

## 2023-02-03 NOTE — Progress Notes (Signed)
Subjective: CC: Patient reports ongoing generalized abdominal pain and bloating. This is slightly better from admission after ngt placement. Feels nauseated and threw up around 4am this morning after walking back from bathroom (sounds like NGT was unhooked from the way at this time). No flatus or bm. Denies hx of sbo in the past. Hx left lateral and partial right hepatectomy's as well as a cholecystectomy.   Objective: Vital signs in last 24 hours: Temp:  [98.2 F (36.8 C)-98.6 F (37 C)] 98.5 F (36.9 C) (02/05 0458) Pulse Rate:  [70-89] 72 (02/05 0458) Resp:  [15-21] 16 (02/05 0458) BP: (115-166)/(70-124) 147/75 (02/05 0458) SpO2:  [93 %-100 %] 98 % (02/05 0458) Weight:  [86.2 kg] 86.2 kg (02/04 1253) Last BM Date : 02/01/23  Intake/Output from previous day: 02/04 0701 - 02/05 0700 In: 250 [I.V.:100; NG/GT:150] Out: 550 [Urine:400; Emesis/NG output:150] Intake/Output this shift: No intake/output data recorded.  PE: Gen:  Alert, NAD, pleasant Abd: Mild to moderate distension but still soft. Generalized ttp without rigidity or guarding. Hypoactive bowel sounds. Prior upper midline and R transverse abdominal scars are well healed. NGT w/ thin bilious output, ~200cc in cannister.   Lab Results:  Recent Labs    02/02/23 1254 02/03/23 0419  WBC 10.9* 12.4*  HGB 12.0 12.0  HCT 36.9 37.3  PLT 406* 406*   BMET Recent Labs    02/02/23 1254 02/03/23 0419  NA 136 137  K 3.9 3.6  CL 105 103  CO2 20* 23  GLUCOSE 128* 118*  BUN 19 16  CREATININE 0.66 0.58  CALCIUM 9.0 9.1   PT/INR No results for input(s): "LABPROT", "INR" in the last 72 hours. CMP     Component Value Date/Time   NA 137 02/03/2023 0419   K 3.6 02/03/2023 0419   CL 103 02/03/2023 0419   CO2 23 02/03/2023 0419   GLUCOSE 118 (H) 02/03/2023 0419   BUN 16 02/03/2023 0419   CREATININE 0.58 02/03/2023 0419   CALCIUM 9.1 02/03/2023 0419   PROT 7.4 02/03/2023 0419   ALBUMIN 3.5 02/03/2023 0419    AST 44 (H) 02/03/2023 0419   ALT 35 02/03/2023 0419   ALKPHOS 230 (H) 02/03/2023 0419   BILITOT 1.0 02/03/2023 0419   GFRNONAA >60 02/03/2023 0419   GFRAA >60 01/08/2019 0609   Lipase     Component Value Date/Time   LIPASE 24 02/02/2023 1254    Studies/Results: DG Abd Portable 1V-Small Bowel Obstruction Protocol-initial, 8 hr delay  Result Date: 02/03/2023 CLINICAL DATA:  Small-bowel obstruction, 8 hour delay. EXAM: PORTABLE ABDOMEN - 1 VIEW COMPARISON:  None Available. FINDINGS: The bowel gas pattern is normal. Enteric tube terminates in the stomach. Calcified fibroid is noted in the pelvis. No radio-opaque calculi or other significant radiographic abnormality are seen. IMPRESSION: 1. No evidence of small-bowel obstruction. 2. NG tube terminates in the stomach. Electronically Signed   By: Brett Fairy M.D.   On: 02/03/2023 02:36   DG Abdomen 1 View  Result Date: 02/02/2023 CLINICAL DATA:  NG tube placement EXAM: ABDOMEN - 1 VIEW COMPARISON:  02/02/23 FINDINGS: 0 there is been interval placement of in the anterior to. The tip and side port are well below the GE junction. Tip is in the expected location of the distal stomach. Dilated loop of small bowel is noted within the imaged portion of the mid abdomen. IMPRESSION: Interval placement of NG tube with tip in the distal stomach. Electronically Signed   By:  Kerby Moors M.D.   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 EXAM: CT ABDOMEN AND PELVIS WITH CONTRAST TECHNIQUE: Multidetector CT imaging of the abdomen and pelvis was performed using the standard protocol following bolus administration of intravenous contrast. RADIATION DOSE REDUCTION: This exam was performed according to the departmental dose-optimization program which includes automated exposure control, adjustment of the mA and/or kV according  to patient size and/or use of iterative reconstruction technique. CONTRAST:  13m OMNIPAQUE IOHEXOL 300 MG/ML  SOLN COMPARISON:  03/26/2021 FINDINGS: Lower chest: No acute abnormality. Hepatobiliary: Multiple large, hypodense liver lesions are not appreciably changed, largest again in the liver dome measuring 12.8 x 9.9 cm (series 2, image 16). Evidence of prior left and right partial resections. Status post cholecystectomy. No biliary ductal dilatation. Pancreas: Unremarkable. No pancreatic ductal dilatation or surrounding inflammatory changes. Spleen: Normal in size without significant abnormality. Adrenals/Urinary Tract: Adrenal glands are unremarkable. Kidneys are normal, without renal calculi, solid lesion, or hydronephrosis. Bladder is unremarkable. Stomach/Bowel: Stomach is within normal limits. Appendix appears normal. Air and fluid-filled loop of mid small bowel in the central left hemiabdomen, with an abrupt transition point in the midline ventral abdomen (series 2, image 57, series 5, image 67). This loop measures no greater than 3.0 cm in caliber but appears mildly distended. Vascular/Lymphatic: No significant vascular findings are present. No enlarged abdominal or pelvic lymph nodes. Reproductive: Uterine fibroids. Other: No abdominal wall hernia or abnormality. No ascites. Musculoskeletal: No acute or significant osseous findings. IMPRESSION: 1. Air and fluid-filled loop of mid small bowel in the central left hemiabdomen, with an abrupt transition point in the midline ventral abdomen. This loop measures no greater than 3.0 cm in caliber but appears mildly distended. Findings are consistent with partial or developing small bowel obstruction. 2. Multiple large, hypodense liver lesions are not appreciably changed, largest again in the liver dome measuring 12.8 x 9.9 cm. Evidence of prior left and right partial resections. By report, these have been previously characterized as hepatic adenomas, and there  is no evidence of complicating hemorrhage or other acute associated finding at this time. 3. Uterine fibroids. Electronically Signed   By: ADelanna AhmadiM.D.   On: 02/02/2023 14:33    Anti-infectives: Anti-infectives (From admission, onward)    None        Assessment/Plan SBO - CT w/ dilated mid small bowel in the central left hemiabdomen with transition in midline ventral abdomen. Hx left lateral and partial right hepatectomy's as well as a cholecystectomy.  - Xray this am reassuring with normal bowel gas pattern however patient with ongoing abdominal pain, nausea and threw up around NGT while it was clamped and she was out of bed. Will keep NPO and on LIWS today. NGT appears to be working now. She is afebrile without tachycardia or hypotension. WBC slightly up at 12.4 from 10.9. No peritonitis on exam. No current indication for emergency surgery.  - Hopefully patient will improve with conservative management. If patient fails to improve with conservative management, they may require exploratory surgery during admission -We will follow with you.  FEN - NPO, NGT to LIWS, IVF per TRH. Keep K>4 and Mg>2 for bowel function.  VTE - SCDs, okay for chem ppx from a general surgery standpoint ID - None  I reviewed nursing notes, hospitalist notes, last 24 h vitals and pain scores, last  48 h intake and output, last 24 h labs and trends, and last 24 h imaging results.     LOS: 1 day    Jillyn Ledger , [facility] 02/03/2023, 8:11 AM Please see Amion for pager number during day hours 7:00am-4:30pm

## 2023-02-03 NOTE — Progress Notes (Signed)
PROGRESS NOTE    Jodi Forbes  HQI:[id number] DOB: 1966-09-25 DOA: 02/02/2023 PCP: Patient, No Pcp Per    Brief Narrative:   Jodi Forbes is a 57 years old female with past medical history of hepatic adenoma status postsurgical resections in the past at [place],presented to hospital with sharp abdominal pain with some nausea and vomiting but no diarrhea.  Symptoms did not improve so she decided to come to the hospital.  In the ED, patient had generalized abdominal tenderness.  Initial blood pressure was elevated.  CBC showed a WBC of 10.9.  BMP showed creatinine at 0.6.  Lipase of 24.  CT scan of the abdomen and pelvis was done which showed air and fluid filled loops of small bowel in the central left hemiabdomen with appropriate transition with multiple large hypodense liver lesions largest 1 up to 12.8 x 9.9 cm.  General surgery was consulted and patient was admitted hospital for further evaluation and treatment.  Assessment and Plan:  Partial small bowel obstruction. Continue supportive care including n.p.o., IV fluids, and G-tube, SBO protocol.  Had vomiting this morning.  General surgery has been consulted.  Follow recommendations.   Abdominal x-ray in AM.  History of hepatic adenomas Elevated LFT likely from hepatic adenomas.  Largest one at 12.8 x 9.9 cm.  Patient stated that she did have multiple hepatic adenoma resections in the past.  Continue analgesia and focus on pain relief.  Follow surgical recommendation.   DVT prophylaxis: SCDs Start: 02/02/23 1703   Code Status:     Code Status: Full Code  Disposition: Likely home in 1 to 2 days  Status is: Inpatient  Remains inpatient appropriate because: Small bowel obstruction, IV fluids, n.p.o. status,   Family Communication: Spoke with the patient's mother at bedside  Consultants:  General surgery  Procedures:  NG tube in place  Antimicrobials:  None  Anti-infectives (From admission, onward)    None       Subjective: Today, patient was seen and examined at bedside.  Continues to have abdominal pain distention, nausea and vomiting.  Patient's mother at bedside.  Has not had a bowel movement or passed gas.  Objective: Vitals:   02/02/23 2056 02/03/23 0034 02/03/23 0458 02/03/23 1024  BP: 115/71 138/78 (!) 147/75 (!) 142/90  Pulse: 70 88 72 83  Resp: '16 16 16 18  '$ Temp: 98.6 F (37 C) 98.5 F (36.9 C) 98.5 F (36.9 C) 98.6 F (37 C)  TempSrc: Oral Oral Oral Oral  SpO2: 99% 96% 98% 99%  Weight:      Height:        Intake/Output Summary (Last 24 hours) at 02/03/2023 1224 Last data filed at 02/03/2023 1000 Gross per 24 hour  Intake 250 ml  Output 1250 ml  Net -1000 ml   Filed Weights   02/02/23 1253  Weight: 86.2 kg    Physical Examination: Body mass index is 32.61 kg/m.   General: Obese built,  alert awake Communicative, in mild distress HENT:   No scleral pallor or icterus noted. Oral mucosa is moist.  Chest:  Clear breath sounds.  Diminished breath sounds bilaterally. No crackles or wheezes.  CVS: S1 &S2 heard. No murmur.  Regular rate and rhythm. Abdomen: Soft, mildly distended, nonspecific tenderness noted.  Bowel sounds are not heard. Extremities: No cyanosis, clubbing or edema.  Peripheral pulses are palpable. Psych: Alert, awake and oriented, normal mood CNS:  No cranial nerve deficits.  Power equal in all extremities.   Skin: Warm  and dry.  No rashes noted.  Data Reviewed:   CBC: Recent Labs  Lab 02/02/23 1254 02/03/23 0419  WBC 10.9* 12.4*  HGB 12.0 12.0  HCT 36.9 37.3  MCV 88.9 90.3  PLT 406* 406*    Basic Metabolic Panel: Recent Labs  Lab 02/02/23 1254 02/03/23 0419  NA 136 137  K 3.9 3.6  CL 105 103  CO2 20* 23  GLUCOSE 128* 118*  BUN 19 16  CREATININE 0.66 0.58  CALCIUM 9.0 9.1    Liver Function Tests: Recent Labs  Lab 02/02/23 1254 02/03/23 0419  AST 21 44*  ALT 19 35  ALKPHOS 263* 230*  BILITOT 0.9 1.0  PROT 7.6 7.4   ALBUMIN 3.6 3.5     Radiology Studies: DG Abd Portable 1V-Small Bowel Obstruction Protocol-initial, 8 hr delay  Result Date: 02/03/2023 CLINICAL DATA:  Small-bowel obstruction, 8 hour delay. EXAM: PORTABLE ABDOMEN - 1 VIEW COMPARISON:  None Available. FINDINGS: The bowel gas pattern is normal. Enteric tube terminates in the stomach. Calcified fibroid is noted in the pelvis. No radio-opaque calculi or other significant radiographic abnormality are seen. IMPRESSION: 1. No evidence of small-bowel obstruction. 2. NG tube terminates in the stomach. Electronically Signed   By: Brett Fairy M.D.   On: 02/03/2023 02:36   DG Abdomen 1 View  Result Date: 02/02/2023 CLINICAL DATA:  NG tube placement EXAM: ABDOMEN - 1 VIEW COMPARISON:  02/02/23 FINDINGS: 0 there is been interval placement of in the anterior to. The tip and side port are well below the GE junction. Tip is in the expected location of the distal stomach. Dilated loop of small bowel is noted within the imaged portion of the mid abdomen. IMPRESSION: Interval placement of NG tube with tip in the distal stomach. Electronically Signed   By: Kerby Moors M.D.   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 EXAM: CT ABDOMEN AND PELVIS WITH CONTRAST TECHNIQUE: Multidetector CT imaging of the abdomen and pelvis was performed using the standard protocol following bolus administration of intravenous contrast. RADIATION DOSE REDUCTION: This exam was performed according to the departmental dose-optimization program which includes automated exposure control, adjustment of the mA and/or kV according to patient size and/or use of iterative reconstruction technique. CONTRAST:  171m OMNIPAQUE IOHEXOL 300 MG/ML  SOLN COMPARISON:  03/26/2021 FINDINGS: Lower chest: No acute abnormality. Hepatobiliary: Multiple large, hypodense  liver lesions are not appreciably changed, largest again in the liver dome measuring 12.8 x 9.9 cm (series 2, image 16). Evidence of prior left and right partial resections. Status post cholecystectomy. No biliary ductal dilatation. Pancreas: Unremarkable. No pancreatic ductal dilatation or surrounding inflammatory changes. Spleen: Normal in size without significant abnormality. Adrenals/Urinary Tract: Adrenal glands are unremarkable. Kidneys are normal, without renal calculi, solid lesion, or hydronephrosis. Bladder is unremarkable. Stomach/Bowel: Stomach is within normal limits. Appendix appears normal. Air and fluid-filled loop of mid small bowel in the central left hemiabdomen, with an abrupt transition point in the midline ventral abdomen (series 2, image 57, series 5, image 67). This loop measures no greater than 3.0 cm in caliber but appears mildly distended. Vascular/Lymphatic: No significant vascular findings are present. No enlarged abdominal or pelvic lymph nodes. Reproductive: Uterine fibroids. Other: No abdominal wall hernia or abnormality. No ascites. Musculoskeletal: No acute or significant osseous findings. IMPRESSION: 1. Air and fluid-filled loop of mid small bowel  in the central left hemiabdomen, with an abrupt transition point in the midline ventral abdomen. This loop measures no greater than 3.0 cm in caliber but appears mildly distended. Findings are consistent with partial or developing small bowel obstruction. 2. Multiple large, hypodense liver lesions are not appreciably changed, largest again in the liver dome measuring 12.8 x 9.9 cm. Evidence of prior left and right partial resections. By report, these have been previously characterized as hepatic adenomas, and there is no evidence of complicating hemorrhage or other acute associated finding at this time. 3. Uterine fibroids. Electronically Signed   By: Delanna Ahmadi M.D.   On: 02/02/2023 14:33      LOS: 1 day    Flora Lipps,  MD [facility] Available via Epic secure chat 7am-7pm After these hours, please refer to coverage provider listed on [email or web address] 02/03/2023, 12:24 PM

## 2023-02-03 NOTE — Progress Notes (Signed)
Mobility Specialist - Progress Note    02/03/23 1451  Mobility  Activity Ambulated independently in hallway  Level of Assistance Independent after set-up  Assistive Device None  Distance Ambulated (ft) 480 ft  Range of Motion/Exercises Active  Activity Response Tolerated well  Mobility Referral Yes  $Mobility charge 1 Mobility   Pt was found in bed and agreeable to ambulate. Stated having some tenderness on her abdomin during session. At EOS returned to bed with all necessities in reach.  Ferd Hibbs Mobility Specialist

## 2023-02-03 NOTE — Progress Notes (Signed)
  Transition of Care (TOC) Screening Note   Patient Details  Name: Jodi Forbes Date of Birth: 19-Apr-1966   [facility] CM/SW Contact:    Lennart Pall, LCSW Phone Number: 02/03/2023, 12:28 PM    Transition of Care Department [facility]) has reviewed patient and no TOC needs have been identified at this time. We will continue to monitor patient advancement through interdisciplinary progression rounds. If new patient transition needs arise, please place a TOC consult.

## 2023-02-04 ENCOUNTER — Inpatient Hospital Stay (HOSPITAL_COMMUNITY): Payer: Medicaid Other

## 2023-02-04 DIAGNOSIS — Z8719 Personal history of other diseases of the digestive system: Secondary | ICD-10-CM | POA: Diagnosis not present

## 2023-02-04 DIAGNOSIS — D134 Benign neoplasm of liver: Secondary | ICD-10-CM | POA: Diagnosis not present

## 2023-02-04 DIAGNOSIS — K3189 Other diseases of stomach and duodenum: Secondary | ICD-10-CM | POA: Diagnosis not present

## 2023-02-04 DIAGNOSIS — Z4682 Encounter for fitting and adjustment of non-vascular catheter: Secondary | ICD-10-CM | POA: Diagnosis not present

## 2023-02-04 DIAGNOSIS — K56600 Partial intestinal obstruction, unspecified as to cause: Secondary | ICD-10-CM | POA: Diagnosis not present

## 2023-02-04 DIAGNOSIS — K6389 Other specified diseases of intestine: Secondary | ICD-10-CM | POA: Diagnosis not present

## 2023-02-04 DIAGNOSIS — R748 Abnormal levels of other serum enzymes: Secondary | ICD-10-CM | POA: Diagnosis not present

## 2023-02-04 DIAGNOSIS — K5669 Other partial intestinal obstruction: Secondary | ICD-10-CM | POA: Diagnosis not present

## 2023-02-04 DIAGNOSIS — I878 Other specified disorders of veins: Secondary | ICD-10-CM | POA: Diagnosis not present

## 2023-02-04 LAB — CBC
HCT: 35.6 % — ABNORMAL LOW (ref 36.0–46.0)
Hemoglobin: 11.3 g/dL — ABNORMAL LOW (ref 12.0–15.0)
MCH: 29 pg (ref 26.0–34.0)
MCHC: 31.7 g/dL (ref 30.0–36.0)
MCV: 91.3 fL (ref 80.0–100.0)
Platelets: 360 10*3/uL (ref 150–400)
RBC: 3.9 MIL/uL (ref 3.87–5.11)
RDW: 12.7 % (ref 11.5–15.5)
WBC: 14.2 10*3/uL — ABNORMAL HIGH (ref 4.0–10.5)
nRBC: 0 % (ref 0.0–0.2)

## 2023-02-04 LAB — BASIC METABOLIC PANEL
Anion gap: 10 (ref 5–15)
BUN: 22 mg/dL — ABNORMAL HIGH (ref 6–20)
CO2: 25 mmol/L (ref 22–32)
Calcium: 8.8 mg/dL — ABNORMAL LOW (ref 8.9–10.3)
Chloride: 104 mmol/L (ref 98–111)
Creatinine, Ser: 0.73 mg/dL (ref 0.44–1.00)
GFR, Estimated: >60 mL/min (ref >60)
Glucose, Bld: 104 mg/dL — ABNORMAL HIGH (ref 70–99)
Potassium: 3.6 mmol/L (ref 3.5–5.1)
Sodium: 139 mmol/L (ref 135–145)

## 2023-02-04 LAB — MAGNESIUM: Magnesium: 2 mg/dL (ref 1.7–2.4)

## 2023-02-04 MED ORDER — DIATRIZOATE MEGLUMINE & SODIUM 66-10 % PO SOLN
90.0000 mL | Freq: Once | ORAL | Status: AC
  Administered 2023-02-04: 90 mL via NASOGASTRIC
  Filled 2023-02-04: qty 90

## 2023-02-04 MED ORDER — SODIUM CHLORIDE 0.9 % IV SOLN: INTRAVENOUS | Status: DC

## 2023-02-04 NOTE — Progress Notes (Signed)
Subjective: CC: Feels better overall but still having nausea and generalized abdominal pain that she rates as a 5/10 (previously 10/10). No flatus or bm. NGT w/ 750cc/24 hours. Mobilizing.   Objective: Vital signs in last 24 hours: Temp:  [98.6 F (37 C)-99.3 F (37.4 C)] 99.3 F (37.4 C) (02/06 0510) Pulse Rate:  [81-103] 103 (02/06 0510) Resp:  [18] 18 (02/06 0510) BP: (126-142)/(73-90) 137/83 (02/06 0510) SpO2:  [97 %-100 %] 97 % (02/06 0510) Last BM Date : 02/01/23  Intake/Output from previous day: 02/05 0701 - 02/06 0700 In: 1130.9 [I.V.:1130.9] Out: 2150 [Urine:1400; Emesis/NG output:750] Intake/Output this shift: No intake/output data recorded.  PE: Gen:  Alert, NAD, pleasant Abd: Mild to moderate distension but still soft. Generalized ttp without rigidity or involuntary guarding. I can hear some bowel sounds today. Prior upper midline and R transverse abdominal scars are well healed. NGT w/ thin bilious output, 750cc/24 hours.   Lab Results:  Recent Labs    02/03/23 0419 02/04/23 0416  WBC 12.4* 14.2*  HGB 12.0 11.3*  HCT 37.3 35.6*  PLT 406* 360   BMET Recent Labs    02/03/23 0419 02/04/23 0416  NA 137 139  K 3.6 3.6  CL 103 104  CO2 23 25  GLUCOSE 118* 104*  BUN 16 22*  CREATININE 0.58 0.73  CALCIUM 9.1 8.8*   PT/INR No results for input(s): "LABPROT", "INR" in the last 72 hours. CMP     Component Value Date/Time   NA 139 02/04/2023 0416   K 3.6 02/04/2023 0416   CL 104 02/04/2023 0416   CO2 25 02/04/2023 0416   GLUCOSE 104 (H) 02/04/2023 0416   BUN 22 (H) 02/04/2023 0416   CREATININE 0.73 02/04/2023 0416   CALCIUM 8.8 (L) 02/04/2023 0416   PROT 7.4 02/03/2023 0419   ALBUMIN 3.5 02/03/2023 0419   AST 44 (H) 02/03/2023 0419   ALT 35 02/03/2023 0419   ALKPHOS 230 (H) 02/03/2023 0419   BILITOT 1.0 02/03/2023 0419   GFRNONAA >60 02/04/2023 0416   GFRAA >60 01/08/2019 0609   Lipase     Component Value Date/Time   LIPASE 24  02/02/2023 1254    Studies/Results: DG Abd 2 Views  Result Date: 02/04/2023 CLINICAL DATA:  Small-bowel obstruction. EXAM: ABDOMEN - 2 VIEW COMPARISON:  Abdomen 02/03/2023.  CT 02/02/2023. FINDINGS: NG tube in stable position. No bowel distention noted. Stool is noted the colon. No free air noted. Surgical sutures and dystrophic calcifications again noted over the upper abdomen. Calcified fibroid again noted. Pelvic calcifications consistent phleboliths. No acute bony abnormality. IMPRESSION: NG tube noted stable position. No bowel distention or free air. Exam stable from prior exam. Electronically Signed   By: Marcello Moores  Register M.D.   On: 02/04/2023 05:44   DG Abd Portable 1V-Small Bowel Obstruction Protocol-initial, 8 hr delay  Result Date: 02/03/2023 CLINICAL DATA:  Small-bowel obstruction, 8 hour delay. EXAM: PORTABLE ABDOMEN - 1 VIEW COMPARISON:  None Available. FINDINGS: The bowel gas pattern is normal. Enteric tube terminates in the stomach. Calcified fibroid is noted in the pelvis. No radio-opaque calculi or other significant radiographic abnormality are seen. IMPRESSION: 1. No evidence of small-bowel obstruction. 2. NG tube terminates in the stomach. Electronically Signed   By: Brett Fairy M.D.   On: 02/03/2023 02:36   DG Abdomen 1 View  Result Date: 02/02/2023 CLINICAL DATA:  NG tube placement EXAM: ABDOMEN - 1 VIEW COMPARISON:  02/02/23 FINDINGS: 0 there is been  interval placement of in the anterior to. The tip and side port are well below the GE junction. Tip is in the expected location of the distal stomach. Dilated loop of small bowel is noted within the imaged portion of the mid abdomen. IMPRESSION: Interval placement of NG tube with tip in the distal stomach. Electronically Signed   By: Kerby Moors M.D.   On: 02/02/2023 15:54   CT Abdomen Pelvis W Contrast  Result Date: 02/02/2023 CLINICAL DATA:  Abdominal and back pain, history of multiple hepatic adenomas status post partial  resection. EXAM: CT ABDOMEN AND PELVIS WITH CONTRAST TECHNIQUE: Multidetector CT imaging of the abdomen and pelvis was performed using the standard protocol following bolus administration of intravenous contrast. RADIATION DOSE REDUCTION: This exam was performed according to the departmental dose-optimization program which includes automated exposure control, adjustment of the mA and/or kV according to patient size and/or use of iterative reconstruction technique. CONTRAST:  123m OMNIPAQUE IOHEXOL 300 MG/ML  SOLN COMPARISON:  03/26/2021 FINDINGS: Lower chest: No acute abnormality. Hepatobiliary: Multiple large, hypodense liver lesions are not appreciably changed, largest again in the liver dome measuring 12.8 x 9.9 cm (series 2, image 16). Evidence of prior left and right partial resections. Status post cholecystectomy. No biliary ductal dilatation. Pancreas: Unremarkable. No pancreatic ductal dilatation or surrounding inflammatory changes. Spleen: Normal in size without significant abnormality. Adrenals/Urinary Tract: Adrenal glands are unremarkable. Kidneys are normal, without renal calculi, solid lesion, or hydronephrosis. Bladder is unremarkable. Stomach/Bowel: Stomach is within normal limits. Appendix appears normal. Air and fluid-filled loop of mid small bowel in the central left hemiabdomen, with an abrupt transition point in the midline ventral abdomen (series 2, image 57, series 5, image 67). This loop measures no greater than 3.0 cm in caliber but appears mildly distended. Vascular/Lymphatic: No significant vascular findings are present. No enlarged abdominal or pelvic lymph nodes. Reproductive: Uterine fibroids. Other: No abdominal wall hernia or abnormality. No ascites. Musculoskeletal: No acute or significant osseous findings. IMPRESSION: 1. Air and fluid-filled loop of mid small bowel in the central left hemiabdomen, with an abrupt transition point in the midline ventral abdomen. This loop measures no  greater than 3.0 cm in caliber but appears mildly distended. Findings are consistent with partial or developing small bowel obstruction. 2. Multiple large, hypodense liver lesions are not appreciably changed, largest again in the liver dome measuring 12.8 x 9.9 cm. Evidence of prior left and right partial resections. By report, these have been previously characterized as hepatic adenomas, and there is no evidence of complicating hemorrhage or other acute associated finding at this time. 3. Uterine fibroids. Electronically Signed   By: ADelanna AhmadiM.D.   On: 02/02/2023 14:33    Anti-infectives: Anti-infectives (From admission, onward)    None        Assessment/Plan SBO - CT w/ dilated mid small bowel in the central left hemiabdomen with transition in midline ventral abdomen. Hx left lateral and partial right hepatectomy's as well as a cholecystectomy.  - Xray this am is reassuring but patient with ongoing abdominal pain, nausea and has not had return of bowel function. WBC up 14.2 (from 12.4). Would like to repeat gastrografin study to further eval. Will keep NPO and on LIWS today.  - Hopefully patient will improve with conservative management. If patient fails to improve with conservative management, they may require exploratory surgery during admission. I do not think she needs emergency surgery today. Proceed with plan as outlined above. If study is not reassuring  or she is not opening up tomorrow, will need to start considering surgery.  -We will follow with you.   FEN - NPO, NGT to LIWS, IVF per TRH. Keep K>4 and Mg>2 for bowel function.  VTE - SCDs, okay for chem ppx from a general surgery standpoint ID - None. Afebrile. Tachycardic in low 100's this am (103), no hypotension. WBC up at 14.2 from 12.4  I reviewed nursing notes, hospitalist notes, last 24 h vitals and pain scores, last 48 h intake and output, last 24 h labs and trends, and last 24 h imaging results.    LOS: 2 days     Jillyn Ledger , [facility] Surgery 02/04/2023, 7:50 AM Please see Amion for pager number during day hours 7:00am-4:30pm

## 2023-02-04 NOTE — Progress Notes (Signed)
PROGRESS NOTE    Jodi Forbes  ZHY:[id number] DOB: 11-Apr-1966 DOA: 02/02/2023 PCP: Patient, No Pcp Per    Brief Narrative:   Jodi Forbes is a 57 years old female with past medical history of hepatic adenoma status postsurgical resections in the past at [place],presented to hospital with sharp abdominal pain with some nausea and vomiting.  Symptoms did not improve so she decided to come to the hospital.  In the ED, patient had generalized abdominal tenderness.  Initial blood pressure was elevated.  CBC showed a WBC of 10.9.  BMP showed creatinine at 0.6.  Lipase of 24.  CT scan of the abdomen and pelvis was done which showed air and fluid filled loops of small bowel in the central left hemiabdomen with appropriate transition with multiple large hypodense liver lesions largest one up to 12.8 x 9.9 cm.  General surgery was consulted and patient was admitted hospital for further evaluation and treatment.  Assessment and Plan:  Partial small bowel obstruction. General surgery on board.  Patient on supportive care including n.p.o. IV fluids NG tube.  Abdominal x-ray improvement but no bowel function.  Plan for Gastrografin study today.  Still on NG tube.  Patient might need exploratory laparotomy if not improved.  History of hepatic adenomas Elevated LFT likely from hepatic adenomas.  Largest one at 12.8 x 9.9 cm.  Patient stated that she did have multiple hepatic adenoma resections in the past.  Continue analgesia and focus on pain relief.  Follow surgical recommendation.   DVT prophylaxis: SCDs Start: 02/02/23 1703   Code Status:     Code Status: Full Code  Disposition: Likely home 1 to 2 days pending upon clinical improvement and surgical input.  Status is: Inpatient  Remains inpatient appropriate because: Small bowel obstruction, IV fluids, pending clinical improvement   Family Communication:  Spoke with the patient's mother at bedside on 02/04/2023  Consultants:   General surgery  Procedures:  NG tube placement  Antimicrobials:  None  Anti-infectives (From admission, onward)    None      Subjective: Today, patient was seen and examined at bedside.  Patient did have some nausea and vomiting yesterday.  Still on NG tube.  Has not had any bowel movement or passed flatus.   Objective: Vitals:   02/03/23 1024 02/03/23 1330 02/03/23 2135 02/04/23 0510  BP: (!) 142/90 126/76 130/73 137/83  Pulse: 83 81 85 (!) 103  Resp: '18 18 18 18  '$ Temp: 98.6 F (37 C) 98.9 F (37.2 C) 99.1 F (37.3 C) 99.3 F (37.4 C)  TempSrc: Oral Oral Oral Oral  SpO2: 99% 100% 97% 97%  Weight:      Height:        Intake/Output Summary (Last 24 hours) at 02/04/2023 0743 Last data filed at 02/04/2023 0600 Gross per 24 hour  Intake 1130.88 ml  Output 2150 ml  Net -1019.12 ml    Filed Weights   02/02/23 1253  Weight: 86.2 kg    Physical Examination: Body mass index is 32.61 kg/m.   General: Alert awake in oriented, not in obvious distress, NG tube in place, obese build. HENT:   No scleral pallor or icterus noted. Oral mucosa is moist.  Chest:  Clear breath sounds.  Diminished breath sounds bilaterally. No crackles or wheezes.  CVS: S1 &S2 heard. No murmur.  Regular rate and rhythm. Abdomen: Soft, mildly distended, nonspecific tenderness on palpation, bowel sounds not heard. Extremities: No cyanosis, clubbing or edema.  Peripheral pulses are palpable.  Psych: Alert, awake and oriented, normal mood CNS:  No cranial nerve deficits.  Power equal in all extremities.   Skin: Warm and dry.  No rashes noted.  Data Reviewed:   CBC: Recent Labs  Lab 02/02/23 1254 02/03/23 0419 02/04/23 0416  WBC 10.9* 12.4* 14.2*  HGB 12.0 12.0 11.3*  HCT 36.9 37.3 35.6*  MCV 88.9 90.3 91.3  PLT 406* 406* 360     Basic Metabolic Panel: Recent Labs  Lab 02/02/23 1254 02/03/23 0419 02/04/23 0416  NA 136 137 139  K 3.9 3.6 3.6  CL 105 103 104  CO2 20* 23 25   GLUCOSE 128* 118* 104*  BUN 19 16 22*  CREATININE 0.66 0.58 0.73  CALCIUM 9.0 9.1 8.8*  MG  --   --  2.0     Liver Function Tests: Recent Labs  Lab 02/02/23 1254 02/03/23 0419  AST 21 44*  ALT 19 35  ALKPHOS 263* 230*  BILITOT 0.9 1.0  PROT 7.6 7.4  ALBUMIN 3.6 3.5      Radiology Studies: DG Abd 2 Views  Result Date: 02/04/2023 CLINICAL DATA:  Small-bowel obstruction. EXAM: ABDOMEN - 2 VIEW COMPARISON:  Abdomen 02/03/2023.  CT 02/02/2023. FINDINGS: NG tube in stable position. No bowel distention noted. Stool is noted the colon. No free air noted. Surgical sutures and dystrophic calcifications again noted over the upper abdomen. Calcified fibroid again noted. Pelvic calcifications consistent phleboliths. No acute bony abnormality. IMPRESSION: NG tube noted stable position. No bowel distention or free air. Exam stable from prior exam. Electronically Signed   By: Marcello Moores  Register M.D.   On: 02/04/2023 05:44   DG Abd Portable 1V-Small Bowel Obstruction Protocol-initial, 8 hr delay  Result Date: 02/03/2023 CLINICAL DATA:  Small-bowel obstruction, 8 hour delay. EXAM: PORTABLE ABDOMEN - 1 VIEW COMPARISON:  None Available. FINDINGS: The bowel gas pattern is normal. Enteric tube terminates in the stomach. Calcified fibroid is noted in the pelvis. No radio-opaque calculi or other significant radiographic abnormality are seen. IMPRESSION: 1. No evidence of small-bowel obstruction. 2. NG tube terminates in the stomach. Electronically Signed   By: Brett Fairy M.D.   On: 02/03/2023 02:36   DG Abdomen 1 View  Result Date: 02/02/2023 CLINICAL DATA:  NG tube placement EXAM: ABDOMEN - 1 VIEW COMPARISON:  02/02/23 FINDINGS: 0 there is been interval placement of in the anterior to. The tip and side port are well below the GE junction. Tip is in the expected location of the distal stomach. Dilated loop of small bowel is noted within the imaged portion of the mid abdomen. IMPRESSION: Interval placement of  NG tube with tip in the distal stomach. Electronically Signed   By: Kerby Moors M.D.   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 EXAM: CT ABDOMEN AND PELVIS WITH CONTRAST TECHNIQUE: Multidetector CT imaging of the abdomen and pelvis was performed using the standard protocol following bolus administration of intravenous contrast. RADIATION DOSE REDUCTION: This exam was performed according to the departmental dose-optimization program which includes automated exposure control, adjustment of the mA and/or kV according to patient size and/or use of iterative reconstruction technique. CONTRAST:  124m OMNIPAQUE IOHEXOL 300 MG/ML  SOLN COMPARISON:  03/26/2021 FINDINGS: Lower chest: No acute abnormality. Hepatobiliary: Multiple large, hypodense liver lesions are not appreciably changed, largest again in the liver dome measuring 12.8 x 9.9 cm (  series 2, image 16). Evidence of prior left and right partial resections. Status post cholecystectomy. No biliary ductal dilatation. Pancreas: Unremarkable. No pancreatic ductal dilatation or surrounding inflammatory changes. Spleen: Normal in size without significant abnormality. Adrenals/Urinary Tract: Adrenal glands are unremarkable. Kidneys are normal, without renal calculi, solid lesion, or hydronephrosis. Bladder is unremarkable. Stomach/Bowel: Stomach is within normal limits. Appendix appears normal. Air and fluid-filled loop of mid small bowel in the central left hemiabdomen, with an abrupt transition point in the midline ventral abdomen (series 2, image 57, series 5, image 67). This loop measures no greater than 3.0 cm in caliber but appears mildly distended. Vascular/Lymphatic: No significant vascular findings are present. No enlarged abdominal or pelvic lymph nodes. Reproductive: Uterine fibroids. Other: No abdominal wall  hernia or abnormality. No ascites. Musculoskeletal: No acute or significant osseous findings. IMPRESSION: 1. Air and fluid-filled loop of mid small bowel in the central left hemiabdomen, with an abrupt transition point in the midline ventral abdomen. This loop measures no greater than 3.0 cm in caliber but appears mildly distended. Findings are consistent with partial or developing small bowel obstruction. 2. Multiple large, hypodense liver lesions are not appreciably changed, largest again in the liver dome measuring 12.8 x 9.9 cm. Evidence of prior left and right partial resections. By report, these have been previously characterized as hepatic adenomas, and there is no evidence of complicating hemorrhage or other acute associated finding at this time. 3. Uterine fibroids. Electronically Signed   By: Delanna Ahmadi M.D.   On: 02/02/2023 14:33      LOS: 2 days    Flora Lipps, MD [facility] Available via Epic secure chat 7am-7pm After these hours, please refer to coverage provider listed on [email or web address] 02/04/2023, 7:43 AM

## 2023-02-05 ENCOUNTER — Inpatient Hospital Stay (HOSPITAL_COMMUNITY): Payer: Medicaid Other

## 2023-02-05 DIAGNOSIS — D259 Leiomyoma of uterus, unspecified: Secondary | ICD-10-CM | POA: Diagnosis not present

## 2023-02-05 DIAGNOSIS — R509 Fever, unspecified: Secondary | ICD-10-CM | POA: Diagnosis not present

## 2023-02-05 DIAGNOSIS — K5669 Other partial intestinal obstruction: Secondary | ICD-10-CM | POA: Diagnosis not present

## 2023-02-05 DIAGNOSIS — Z8719 Personal history of other diseases of the digestive system: Secondary | ICD-10-CM | POA: Diagnosis not present

## 2023-02-05 DIAGNOSIS — D134 Benign neoplasm of liver: Secondary | ICD-10-CM | POA: Diagnosis not present

## 2023-02-05 DIAGNOSIS — K56600 Partial intestinal obstruction, unspecified as to cause: Secondary | ICD-10-CM | POA: Diagnosis not present

## 2023-02-05 DIAGNOSIS — R748 Abnormal levels of other serum enzymes: Secondary | ICD-10-CM | POA: Diagnosis not present

## 2023-02-05 LAB — BASIC METABOLIC PANEL
Anion gap: 10 (ref 5–15)
BUN: 21 mg/dL — ABNORMAL HIGH (ref 6–20)
CO2: 24 mmol/L (ref 22–32)
Calcium: 8.5 mg/dL — ABNORMAL LOW (ref 8.9–10.3)
Chloride: 107 mmol/L (ref 98–111)
Creatinine, Ser: 0.57 mg/dL (ref 0.44–1.00)
GFR, Estimated: >60 mL/min (ref >60)
Glucose, Bld: 114 mg/dL — ABNORMAL HIGH (ref 70–99)
Potassium: 3.2 mmol/L — ABNORMAL LOW (ref 3.5–5.1)
Sodium: 141 mmol/L (ref 135–145)

## 2023-02-05 LAB — MAGNESIUM: Magnesium: 2.3 mg/dL (ref 1.7–2.4)

## 2023-02-05 LAB — CBC
HCT: 36.7 % (ref 36.0–46.0)
Hemoglobin: 11.3 g/dL — ABNORMAL LOW (ref 12.0–15.0)
MCH: 28.6 pg (ref 26.0–34.0)
MCHC: 30.8 g/dL (ref 30.0–36.0)
MCV: 92.9 fL (ref 80.0–100.0)
Platelets: 302 10*3/uL (ref 150–400)
RBC: 3.95 MIL/uL (ref 3.87–5.11)
RDW: 12.5 % (ref 11.5–15.5)
WBC: 20.1 10*3/uL — ABNORMAL HIGH (ref 4.0–10.5)
nRBC: 0 % (ref 0.0–0.2)

## 2023-02-05 MED ORDER — LACTATED RINGERS IV BOLUS
1000.0000 mL | Freq: Once | INTRAVENOUS | Status: AC
  Administered 2023-02-05: 1000 mL via INTRAVENOUS

## 2023-02-05 MED ORDER — SODIUM CHLORIDE 0.9 % IV SOLN: INTRAVENOUS | Status: DC

## 2023-02-05 MED ORDER — POTASSIUM CHLORIDE 10 MEQ/100ML IV SOLN
10.0000 meq | INTRAVENOUS | Status: AC
  Administered 2023-02-05 (×4): 10 meq via INTRAVENOUS
  Filled 2023-02-05 (×4): qty 100

## 2023-02-05 NOTE — Progress Notes (Signed)
Mobility Specialist - Progress Note   02/05/23 0905  Mobility  Activity Ambulated independently in hallway  Level of Assistance Independent after set-up  Assistive Device None  Distance Ambulated (ft) 480 ft  Range of Motion/Exercises Active  Activity Response Tolerated well  Mobility Referral Yes  $Mobility charge 1 Mobility   Pt was found in bed and agreeable to ambulate. Still having some tenderness on her abdomin. At EOS returned to recliner chair with necessities and family in room. LPN notified.  Ferd Hibbs Mobility Specialist

## 2023-02-05 NOTE — Progress Notes (Signed)
NP Ouma was notified again patient being a red MEWs. Temp is 103.1 and HR is 121.

## 2023-02-05 NOTE — Progress Notes (Signed)
57 yr old female presented with abdominal pain, nausea, and vomiting on 02/02/23. VS are BP 143/89 T98.6 R16 O2 97. Pulse was 119 rechecked 45 minutes later and was 117.  The x-ray on 02/05/23 showed normal bowel gas and contrast in the colon. Received an order to clamp the NG tube for 4 hours and check residual. Pt can have ice chips and small sips of water. Pt is alert and oriented x4. Skin is dry and intact. Patient is independent. Neuro check vision is impaired and wears glasses. Respiratory, cardiac, vascular and musculoskeletal assessments were within defined limits.  Pts abdomen is soft and tender with faint bowel sounds. Capillary refill was 3 seconds.   Pt had a bowel movement on 02/05/23.

## 2023-02-05 NOTE — Progress Notes (Signed)
PROGRESS NOTE    Audery Wassenaar  WJX:[id number] DOB: February 24, 1966 DOA: 02/02/2023 PCP: Patient, No Pcp Per    Brief Narrative:   Manasvini Whatley is a 57 years old female with past medical history of hepatic adenoma status postsurgical resections in the past at [facility], presented to hospital with sharp abdominal pain with some nausea and vomiting.  Symptoms did not improve so she decided to come to the hospital.  In the ED, patient had generalized abdominal tenderness.  Initial blood pressure was elevated.  CBC showed a WBC of 10.9.  BMP showed creatinine at 0.6.  Lipase of 24.  CT scan of the abdomen and pelvis was done which showed air and fluid filled loops of small bowel in the central left hemiabdomen with appropriate transition with multiple large hypodense liver lesions largest one up to 12.8 x 9.9 cm.  General surgery was consulted and patient was admitted hospital for further evaluation and treatment.  Assessment and Plan:   Partial small bowel obstruction. General surgery on board.  Patient on supportive care including n.p.o. IV fluids NG tube.  Status post Gastrografin study.  Still on NG tube with high output. No bowel movement or gas but has less nauseas.  General surgery with plans for NG clamp and clears.   Follow general surgery recommendation for  History of hepatic adenomas Elevated LFT likely from hepatic adenomas.  Largest one at 12.8 x 9.9 cm.  Patient stated that she did have multiple hepatic adenoma resections in the past.  Continue analgesia and focus on pain relief.  Follow surgical recommendation.  Hypokalemia will replace with IV KCl.  Check levels in AM.  Magnesium level was within normal limits.  Mild leukocytosis.  Temperature max of 99.9 F.  Leukocytosis trending up.  Urinalysis was negative initial WBC at 10.9.  WBC today at 20.1.  Initial chest x-ray showed some patchy opacities over the right lower lung.  Will add incentive spirometry.  Focus on pain  management.  Continues to have fever rising leukocytosis might need initiation of antibiotic. NO respiratory symptoms.   DVT prophylaxis: SCDs Start: 02/02/23 1703   Code Status:     Code Status: Full Code  Disposition: Likely home 1 to 2 days pending upon clinical improvement and surgical input.  Status is: Inpatient  Remains inpatient appropriate because: Small bowel obstruction, IV fluids, pending clinical improvement, rising leukocytosis   Family Communication:  Spoke with the patient's mother at bedside on 02/05/2023  Consultants:  General surgery  Procedures:  NG tube placement  Antimicrobials:  None  Anti-infectives (From admission, onward)    None      Subjective: Today, patient was seen and examined at bedside. Patient denies nausea and vomiting but has mild pain.  Denies any bowel movements.  Objective: Vitals:   02/04/23 1406 02/04/23 2146 02/05/23 0434 02/05/23 0454  BP: 134/74 (!) 142/84 (!) 144/84   Pulse: 100 (!) 108 90   Resp: '18 18 18   '$ Temp: 99 F (37.2 C) 99.9 F (37.7 C)  99.3 F (37.4 C)  TempSrc: Oral Oral Oral Oral  SpO2: 94% 94% 94%   Weight:      Height:        Intake/Output Summary (Last 24 hours) at 02/05/2023 0751 Last data filed at 02/05/2023 0600 Gross per 24 hour  Intake 1234.01 ml  Output 2200 ml  Net -965.99 ml    Filed Weights   02/02/23 1253  Weight: 86.2 kg    Physical Examination: Body  mass index is 32.61 kg/m.   General: Alert and awake, oriented.  Obese HENT:   No scleral pallor or icterus noted. Oral mucosa is moist.  Chest:  Clear breath sounds.  Diminished breath sounds bilaterally. No crackles or wheezes.  CVS: S1 &S2 heard. No murmur.  Regular rate and rhythm. Abdomen: Soft, mildly distended, nonspecific tenderness, bowel sounds not heard. Extremities: No cyanosis, clubbing or edema.  Peripheral pulses are palpable. Psych: Alert, awake and oriented, normal mood CNS:  No cranial nerve deficits.  Power  equal in all extremities.   Skin: Warm and dry.  No rashes noted.  Data Reviewed:   CBC: Recent Labs  Lab 02/02/23 1254 02/03/23 0419 02/04/23 0416 02/05/23 0451  WBC 10.9* 12.4* 14.2* 20.1*  HGB 12.0 12.0 11.3* 11.3*  HCT 36.9 37.3 35.6* 36.7  MCV 88.9 90.3 91.3 92.9  PLT 406* 406* 360 302     Basic Metabolic Panel: Recent Labs  Lab 02/02/23 1254 02/03/23 0419 02/04/23 0416 02/05/23 0451  NA 136 137 139 141  K 3.9 3.6 3.6 3.2*  CL 105 103 104 107  CO2 20* '23 25 24  '$ GLUCOSE 128* 118* 104* 114*  BUN 19 16 22* 21*  CREATININE 0.66 0.58 0.73 0.57  CALCIUM 9.0 9.1 8.8* 8.5*  MG  --   --  2.0 2.3     Liver Function Tests: Recent Labs  Lab 02/02/23 1254 02/03/23 0419  AST 21 44*  ALT 19 35  ALKPHOS 263* 230*  BILITOT 0.9 1.0  PROT 7.6 7.4  ALBUMIN 3.6 3.5      Radiology Studies: DG Abd Portable 1V-Small Bowel Obstruction Protocol-initial, 8 hr delay  Result Date: 02/04/2023 CLINICAL DATA:  Small-bowel obstruction EXAM: PORTABLE ABDOMEN - 1 VIEW COMPARISON:  Earlier films of the same day FINDINGS: Nasogastric tube extends to the gastric antrum. There is hyperdense material in the nondilated stomach and in multiple loops of small bowel which are mildly distended but nondilated. Colon remains decompressed. Peripherally calcified uterine fibroid in the left pelvis. Regional bones unremarkable. IMPRESSION: 1. Nonobstructive bowel gas pattern. 2. Nasogastric tube in the gastric antrum. Electronically Signed   By: Lucrezia Europe M.D.   On: 02/04/2023 20:02   DG Abd 2 Views  Result Date: 02/04/2023 CLINICAL DATA:  Small-bowel obstruction. EXAM: ABDOMEN - 2 VIEW COMPARISON:  Abdomen 02/03/2023.  CT 02/02/2023. FINDINGS: NG tube in stable position. No bowel distention noted. Stool is noted the colon. No free air noted. Surgical sutures and dystrophic calcifications again noted over the upper abdomen. Calcified fibroid again noted. Pelvic calcifications consistent phleboliths.  No acute bony abnormality. IMPRESSION: NG tube noted stable position. No bowel distention or free air. Exam stable from prior exam. Electronically Signed   By: Marcello Moores  Register M.D.   On: 02/04/2023 05:44      LOS: 3 days    Flora Lipps, MD [facility] Available via Epic secure chat 7am-7pm After these hours, please refer to coverage provider listed on [email or web address] 02/05/2023, 7:51 AM

## 2023-02-05 NOTE — Progress Notes (Signed)
Patient is a yellow mews, her temp is 100.6 and her HR is 118.Yellow MEWs protocol initiated and NP Ouma was notified. See new orders.

## 2023-02-05 NOTE — Progress Notes (Signed)
Subjective: CC: Feels better overall. Still having abdominal pain that she describes as crampy pain in her lower abdomen radiating to her periumbilical and epigastrium. Pain is worse at time when she feels she needs to pass flatus but can't. Still requiring IV pain medication (x4 yesterday, x1 this am). Feels distension has improved. Nausea resolved yesterday and no longer is requiring zofran or compazine over the last 24 hours. No flatus or bm. NGT w/ 1300cc/24 hours. Mobilizing with ngt clamped and tolerating.    Objective: Vital signs in last 24 hours: Temp:  [99 F (37.2 C)-99.9 F (37.7 C)] 99.3 F (37.4 C) (02/07 0454) Pulse Rate:  [90-108] 90 (02/07 0434) Resp:  [18] 18 (02/07 0434) BP: (134-144)/(74-84) 144/84 (02/07 0434) SpO2:  [94 %] 94 % (02/07 0434) Last BM Date : 02/01/23  Intake/Output from previous day: 02/06 0701 - 02/07 0700 In: 1234 [I.V.:1234] Out: 2200 [Urine:900; Emesis/NG output:1300] Intake/Output this shift: No intake/output data recorded.  PE: Gen:  Alert, NAD, pleasant Abd: Still mildly distended but appears improved and abdomen remains soft. Central abdominal ttp without rigidity or involuntary guarding that appears improved from yesterday. More active bowel sounds today. Prior upper midline and R transverse abdominal scars are well healed. NGT w/ thin bilious output, 1300cc/24 hours - currently clamped  Lab Results:  Recent Labs    02/04/23 0416 02/05/23 0451  WBC 14.2* 20.1*  HGB 11.3* 11.3*  HCT 35.6* 36.7  PLT 360 302    BMET Recent Labs    02/04/23 0416 02/05/23 0451  NA 139 141  K 3.6 3.2*  CL 104 107  CO2 25 24  GLUCOSE 104* 114*  BUN 22* 21*  CREATININE 0.73 0.57  CALCIUM 8.8* 8.5*    PT/INR No results for input(s): "LABPROT", "INR" in the last 72 hours. CMP     Component Value Date/Time   NA 141 02/05/2023 0451   K 3.2 (L) 02/05/2023 0451   CL 107 02/05/2023 0451   CO2 24 02/05/2023 0451   GLUCOSE 114 (H)  02/05/2023 0451   BUN 21 (H) 02/05/2023 0451   CREATININE 0.57 02/05/2023 0451   CALCIUM 8.5 (L) 02/05/2023 0451   PROT 7.4 02/03/2023 0419   ALBUMIN 3.5 02/03/2023 0419   AST 44 (H) 02/03/2023 0419   ALT 35 02/03/2023 0419   ALKPHOS 230 (H) 02/03/2023 0419   BILITOT 1.0 02/03/2023 0419   GFRNONAA >60 02/05/2023 0451   GFRAA >60 01/08/2019 0609   Lipase     Component Value Date/Time   LIPASE 24 02/02/2023 1254    Studies/Results: DG Abd 2 Views  Result Date: 02/05/2023 CLINICAL DATA:  Small bowel obstruction. EXAM: ABDOMEN - 2 VIEW COMPARISON:  February 04, 2023. FINDINGS: Distal tip of nasogastric tube is seen in expected position of distal stomach. Residual contrast is noted in nondilated colon. No small bowel dilatation is noted. Calcified uterine fibroid is noted in the pelvis. IMPRESSION: No abnormal bowel dilatation. Electronically Signed   By: Marijo Conception M.D.   On: 02/05/2023 08:40   DG Abd Portable 1V-Small Bowel Obstruction Protocol-initial, 8 hr delay  Result Date: 02/04/2023 CLINICAL DATA:  Small-bowel obstruction EXAM: PORTABLE ABDOMEN - 1 VIEW COMPARISON:  Earlier films of the same day FINDINGS: Nasogastric tube extends to the gastric antrum. There is hyperdense material in the nondilated stomach and in multiple loops of small bowel which are mildly distended but nondilated. Colon remains decompressed. Peripherally calcified uterine fibroid in the left  pelvis. Regional bones unremarkable. IMPRESSION: 1. Nonobstructive bowel gas pattern. 2. Nasogastric tube in the gastric antrum. Electronically Signed   By: Lucrezia Europe M.D.   On: 02/04/2023 20:02   DG Abd 2 Views  Result Date: 02/04/2023 CLINICAL DATA:  Small-bowel obstruction. EXAM: ABDOMEN - 2 VIEW COMPARISON:  Abdomen 02/03/2023.  CT 02/02/2023. FINDINGS: NG tube in stable position. No bowel distention noted. Stool is noted the colon. No free air noted. Surgical sutures and dystrophic calcifications again noted over the  upper abdomen. Calcified fibroid again noted. Pelvic calcifications consistent phleboliths. No acute bony abnormality. IMPRESSION: NG tube noted stable position. No bowel distention or free air. Exam stable from prior exam. Electronically Signed   By: Marcello Moores  Register M.D.   On: 02/04/2023 05:44    Anti-infectives: Anti-infectives (From admission, onward)    None        Assessment/Plan SBO - CT w/ dilated mid small bowel in the central left hemiabdomen with transition in midline ventral abdomen. Hx left lateral and partial right hepatectomy's as well as a cholecystectomy.  - Xray this am is very reassuring with no obvious dilated small bowel and contrast in colon. Her nausea has resolved however patients NGT still high output with 1.3L bilious output/24 hours, no return of bowel function, and although her pain has improved, it is still present. Her vitals are reassuring without fever or hypotension. Tachycardia resolved this am (HR low 100's yesterday). WBC up today at 20.1. I do not think she needs emergency surgery but would like to go slow by starting with NGT clamping trial. Will check residuals in 4 hours and see if she tolerates before determining if NGT can be removed.    FEN - Clamp ngt as noted above. IVF per TRH. K 3.2 (getting replaced) VTE - SCDs, okay for chem ppx from a general surgery standpoint ID - None. Afebrile. Tachycardic resolved this am. No hypotension. WBC up (12.4 > 14.2 > 20.1)  I reviewed nursing notes, hospitalist notes, last 24 h vitals and pain scores, last 48 h intake and output, last 24 h labs and trends, and last 24 h imaging results.    LOS: 3 days    Jillyn Ledger , [facility] 02/05/2023, 9:24 AM Please see Amion for pager number during day hours 7:00am-4:30pm

## 2023-02-05 NOTE — Progress Notes (Addendum)
Patient is a red mews, HR is 122 and temp is 102.4. Red Mews protocol initiated. NP Ouma notified. RR RN Leafy Ro was notified too. Ice bags was placed on patient, blankets removed, and room temp was lowered.

## 2023-02-05 NOTE — Progress Notes (Signed)
Patient tolerated NGT clamping. Residuals ~50cc. Denies nausea. Stable/improved abdominal pain from this am, no longer requiring prn pain medication. Had large loose bm this afternoon. Will d/c ngt and start liquids.   Jillyn Ledger, PA-C

## 2023-02-06 ENCOUNTER — Encounter (HOSPITAL_COMMUNITY): Payer: Self-pay | Admitting: Osteopathic Medicine

## 2023-02-06 ENCOUNTER — Inpatient Hospital Stay (HOSPITAL_COMMUNITY): Payer: Medicaid Other

## 2023-02-06 DIAGNOSIS — K56609 Unspecified intestinal obstruction, unspecified as to partial versus complete obstruction: Secondary | ICD-10-CM | POA: Diagnosis not present

## 2023-02-06 DIAGNOSIS — K56600 Partial intestinal obstruction, unspecified as to cause: Secondary | ICD-10-CM | POA: Diagnosis not present

## 2023-02-06 DIAGNOSIS — K922 Gastrointestinal hemorrhage, unspecified: Secondary | ICD-10-CM | POA: Diagnosis not present

## 2023-02-06 DIAGNOSIS — R918 Other nonspecific abnormal finding of lung field: Secondary | ICD-10-CM | POA: Diagnosis not present

## 2023-02-06 DIAGNOSIS — D376 Neoplasm of uncertain behavior of liver, gallbladder and bile ducts: Secondary | ICD-10-CM | POA: Diagnosis not present

## 2023-02-06 DIAGNOSIS — R509 Fever, unspecified: Secondary | ICD-10-CM

## 2023-02-06 DIAGNOSIS — D134 Benign neoplasm of liver: Secondary | ICD-10-CM | POA: Diagnosis not present

## 2023-02-06 DIAGNOSIS — K5669 Other partial intestinal obstruction: Secondary | ICD-10-CM | POA: Diagnosis not present

## 2023-02-06 DIAGNOSIS — K7689 Other specified diseases of liver: Secondary | ICD-10-CM | POA: Diagnosis not present

## 2023-02-06 DIAGNOSIS — A419 Sepsis, unspecified organism: Secondary | ICD-10-CM | POA: Diagnosis not present

## 2023-02-06 DIAGNOSIS — R748 Abnormal levels of other serum enzymes: Secondary | ICD-10-CM | POA: Diagnosis not present

## 2023-02-06 HISTORY — PX: IR ANGIOGRAM SELECTIVE EACH ADDITIONAL VESSEL: IMG667

## 2023-02-06 HISTORY — PX: IR ANGIOGRAM VISCERAL SELECTIVE: IMG657

## 2023-02-06 HISTORY — PX: IR US GUIDE VASC ACCESS RIGHT: IMG2390

## 2023-02-06 HISTORY — PX: IR EMBO ART  VEN HEMORR LYMPH EXTRAV  INC GUIDE ROADMAPPING: IMG5450

## 2023-02-06 LAB — COMPREHENSIVE METABOLIC PANEL
ALT: 281 U/L — ABNORMAL HIGH (ref 0–44)
AST: 119 U/L — ABNORMAL HIGH (ref 15–41)
Albumin: 2.6 g/dL — ABNORMAL LOW (ref 3.5–5.0)
Alkaline Phosphatase: 231 U/L — ABNORMAL HIGH (ref 38–126)
Anion gap: 9 (ref 5–15)
BUN: 12 mg/dL (ref 6–20)
CO2: 22 mmol/L (ref 22–32)
Calcium: 8 mg/dL — ABNORMAL LOW (ref 8.9–10.3)
Chloride: 105 mmol/L (ref 98–111)
Creatinine, Ser: 0.55 mg/dL (ref 0.44–1.00)
GFR, Estimated: >60 mL/min (ref >60)
Glucose, Bld: 102 mg/dL — ABNORMAL HIGH (ref 70–99)
Potassium: 3.3 mmol/L — ABNORMAL LOW (ref 3.5–5.1)
Sodium: 136 mmol/L (ref 135–145)
Total Bilirubin: 1.4 mg/dL — ABNORMAL HIGH (ref 0.3–1.2)
Total Protein: 6.3 g/dL — ABNORMAL LOW (ref 6.5–8.1)

## 2023-02-06 LAB — RESP PANEL BY RT-PCR (RSV, FLU A&B, COVID)  RVPGX2
Influenza A by PCR: NEGATIVE
Influenza B by PCR: NEGATIVE
Resp Syncytial Virus by PCR: NEGATIVE
SARS Coronavirus 2 by RT PCR: NEGATIVE

## 2023-02-06 LAB — CBC WITH DIFFERENTIAL/PLATELET
Abs Immature Granulocytes: 0.11 10*3/uL — ABNORMAL HIGH (ref 0.00–0.07)
Basophils Absolute: 0.1 10*3/uL (ref 0.0–0.1)
Basophils Relative: 0 %
Eosinophils Absolute: 0 10*3/uL (ref 0.0–0.5)
Eosinophils Relative: 0 %
HCT: 30.7 % — ABNORMAL LOW (ref 36.0–46.0)
Hemoglobin: 9.6 g/dL — ABNORMAL LOW (ref 12.0–15.0)
Immature Granulocytes: 1 %
Lymphocytes Relative: 9 %
Lymphs Abs: 1.5 10*3/uL (ref 0.7–4.0)
MCH: 29.4 pg (ref 26.0–34.0)
MCHC: 31.3 g/dL (ref 30.0–36.0)
MCV: 94.2 fL (ref 80.0–100.0)
Monocytes Absolute: 2.2 10*3/uL — ABNORMAL HIGH (ref 0.1–1.0)
Monocytes Relative: 13 %
Neutro Abs: 13 10*3/uL — ABNORMAL HIGH (ref 1.7–7.7)
Neutrophils Relative %: 77 %
Platelets: 270 10*3/uL (ref 150–400)
RBC: 3.26 MIL/uL — ABNORMAL LOW (ref 3.87–5.11)
RDW: 12.6 % (ref 11.5–15.5)
WBC: 16.9 10*3/uL — ABNORMAL HIGH (ref 4.0–10.5)
nRBC: 0 % (ref 0.0–0.2)

## 2023-02-06 LAB — URINALYSIS, W/ REFLEX TO CULTURE (INFECTION SUSPECTED)
Bacteria, UA: NONE SEEN
Bilirubin Urine: NEGATIVE
Glucose, UA: NEGATIVE mg/dL
Ketones, ur: 80 mg/dL — AB
Nitrite: NEGATIVE
Protein, ur: NEGATIVE mg/dL
Specific Gravity, Urine: 1.019 (ref 1.005–1.030)
pH: 5 (ref 5.0–8.0)

## 2023-02-06 LAB — CBC
HCT: 29.6 % — ABNORMAL LOW (ref 36.0–46.0)
HCT: 29.4 % — ABNORMAL LOW (ref 36.0–46.0)
Hemoglobin: 9.3 g/dL — ABNORMAL LOW (ref 12.0–15.0)
Hemoglobin: 9.2 g/dL — ABNORMAL LOW (ref 12.0–15.0)
MCH: 29.2 pg (ref 26.0–34.0)
MCH: 29.3 pg (ref 26.0–34.0)
MCHC: 31.4 g/dL (ref 30.0–36.0)
MCHC: 31.3 g/dL (ref 30.0–36.0)
MCV: 92.8 fL (ref 80.0–100.0)
MCV: 93.6 fL (ref 80.0–100.0)
Platelets: 260 10*3/uL (ref 150–400)
Platelets: 251 10*3/uL (ref 150–400)
RBC: 3.19 MIL/uL — ABNORMAL LOW (ref 3.87–5.11)
RBC: 3.14 MIL/uL — ABNORMAL LOW (ref 3.87–5.11)
RDW: 12.3 % (ref 11.5–15.5)
RDW: 12.3 % (ref 11.5–15.5)
WBC: 15.6 10*3/uL — ABNORMAL HIGH (ref 4.0–10.5)
WBC: 13.9 10*3/uL — ABNORMAL HIGH (ref 4.0–10.5)
nRBC: 0 % (ref 0.0–0.2)
nRBC: 0 % (ref 0.0–0.2)

## 2023-02-06 LAB — PROTIME-INR
INR: 1.4 — ABNORMAL HIGH (ref 0.8–1.2)
Prothrombin Time: 16.8 seconds — ABNORMAL HIGH (ref 11.4–15.2)

## 2023-02-06 LAB — LACTIC ACID, PLASMA
Lactic Acid, Venous: 1 mmol/L (ref 0.5–1.9)
Lactic Acid, Venous: 0.7 mmol/L (ref 0.5–1.9)

## 2023-02-06 LAB — MAGNESIUM: Magnesium: 1.7 mg/dL (ref 1.7–2.4)

## 2023-02-06 LAB — CORTISOL: Cortisol, Plasma: 13.4 ug/dL

## 2023-02-06 LAB — APTT: aPTT: 34 seconds (ref 24–36)

## 2023-02-06 LAB — PROCALCITONIN: Procalcitonin: 0.86 ng/mL

## 2023-02-06 MED ORDER — ACETAMINOPHEN 325 MG PO TABS
650.0000 mg | ORAL_TABLET | Freq: Four times a day (QID) | ORAL | Status: DC | PRN
  Administered 2023-02-06 – 2023-02-09 (×6): 650 mg via ORAL
  Filled 2023-02-06 (×6): qty 2

## 2023-02-06 MED ORDER — GELATIN ABSORBABLE 12-7 MM EX MISC
CUTANEOUS | Status: AC
  Filled 2023-02-06: qty 1

## 2023-02-06 MED ORDER — IOHEXOL 300 MG/ML  SOLN
100.0000 mL | Freq: Once | INTRAMUSCULAR | Status: AC | PRN
  Administered 2023-02-06: 75 mL

## 2023-02-06 MED ORDER — FENTANYL CITRATE (PF) 100 MCG/2ML IJ SOLN
INTRAMUSCULAR | Status: AC | PRN
  Administered 2023-02-06 (×2): 25 ug via INTRAVENOUS
  Administered 2023-02-06: 50 ug via INTRAVENOUS

## 2023-02-06 MED ORDER — PIPERACILLIN-TAZOBACTAM 3.375 G IVPB
INTRAVENOUS | Status: AC | PRN
  Administered 2023-02-06: 3.375 g via INTRAVENOUS

## 2023-02-06 MED ORDER — VANCOMYCIN HCL IN DEXTROSE 1-5 GM/200ML-% IV SOLN: 1000.0000 mg | Freq: Once | INTRAVENOUS | Status: DC

## 2023-02-06 MED ORDER — PIPERACILLIN-TAZOBACTAM 3.375 G IVPB
INTRAVENOUS | Status: AC
  Filled 2023-02-06: qty 50

## 2023-02-06 MED ORDER — IOHEXOL 9 MG/ML PO SOLN
500.0000 mL | ORAL | Status: AC
  Administered 2023-02-06 (×2): 500 mL via ORAL

## 2023-02-06 MED ORDER — ONDANSETRON HCL 4 MG/2ML IJ SOLN
INTRAMUSCULAR | Status: AC
  Filled 2023-02-06: qty 2

## 2023-02-06 MED ORDER — MIDAZOLAM HCL 2 MG/2ML IJ SOLN
INTRAMUSCULAR | Status: AC
  Filled 2023-02-06: qty 2

## 2023-02-06 MED ORDER — IOHEXOL 300 MG/ML  SOLN
100.0000 mL | Freq: Once | INTRAMUSCULAR | Status: AC | PRN
  Administered 2023-02-06: 100 mL via INTRAVENOUS

## 2023-02-06 MED ORDER — CHLORHEXIDINE GLUCONATE CLOTH 2 % EX PADS
6.0000 | MEDICATED_PAD | Freq: Every day | CUTANEOUS | Status: DC
  Administered 2023-02-06 – 2023-02-09 (×4): 6 via TOPICAL

## 2023-02-06 MED ORDER — IOHEXOL 300 MG/ML  SOLN
100.0000 mL | Freq: Once | INTRAMUSCULAR | Status: AC | PRN
  Administered 2023-02-06: 10 mL via INTRAVENOUS

## 2023-02-06 MED ORDER — LACTATED RINGERS IV BOLUS (SEPSIS)
1000.0000 mL | Freq: Once | INTRAVENOUS | Status: AC
  Administered 2023-02-06: 1000 mL via INTRAVENOUS

## 2023-02-06 MED ORDER — IOHEXOL 9 MG/ML PO SOLN
ORAL | Status: AC
  Administered 2023-02-06: 500 mL
  Filled 2023-02-06: qty 1000

## 2023-02-06 MED ORDER — VANCOMYCIN HCL 1250 MG/250ML IV SOLN
1250.0000 mg | INTRAVENOUS | Status: DC
  Administered 2023-02-07 – 2023-02-08 (×2): 1250 mg via INTRAVENOUS
  Filled 2023-02-06 (×3): qty 250

## 2023-02-06 MED ORDER — ONDANSETRON HCL 4 MG/2ML IJ SOLN
INTRAMUSCULAR | Status: AC | PRN
  Administered 2023-02-06: 4 mg via INTRAVENOUS

## 2023-02-06 MED ORDER — METRONIDAZOLE 500 MG/100ML IV SOLN
500.0000 mg | Freq: Two times a day (BID) | INTRAVENOUS | Status: DC
  Administered 2023-02-06 – 2023-02-09 (×8): 500 mg via INTRAVENOUS
  Filled 2023-02-06 (×8): qty 100

## 2023-02-06 MED ORDER — LIDOCAINE HCL 1 % IJ SOLN
INTRAMUSCULAR | Status: AC
  Administered 2023-02-06: 3 mL via INTRADERMAL
  Filled 2023-02-06: qty 20

## 2023-02-06 MED ORDER — ORAL CARE MOUTH RINSE: 15.0000 mL | OROMUCOSAL | Status: DC | PRN

## 2023-02-06 MED ORDER — ORAL CARE MOUTH RINSE
15.0000 mL | OROMUCOSAL | Status: DC
  Administered 2023-02-07 – 2023-02-10 (×8): 15 mL via OROMUCOSAL

## 2023-02-06 MED ORDER — FENTANYL CITRATE (PF) 100 MCG/2ML IJ SOLN
INTRAMUSCULAR | Status: AC
  Filled 2023-02-06: qty 2

## 2023-02-06 MED ORDER — MIDAZOLAM HCL 2 MG/2ML IJ SOLN
INTRAMUSCULAR | Status: AC | PRN
  Administered 2023-02-06: .5 mg via INTRAVENOUS
  Administered 2023-02-06: 1 mg via INTRAVENOUS
  Administered 2023-02-06: .5 mg via INTRAVENOUS

## 2023-02-06 MED ORDER — SODIUM CHLORIDE 0.9 % IV SOLN
2.0000 g | Freq: Once | INTRAVENOUS | Status: AC
  Administered 2023-02-06: 2 g via INTRAVENOUS
  Filled 2023-02-06: qty 12.5

## 2023-02-06 MED ORDER — VANCOMYCIN HCL 1750 MG/350ML IV SOLN
1750.0000 mg | Freq: Once | INTRAVENOUS | Status: AC
  Administered 2023-02-06: 1750 mg via INTRAVENOUS
  Filled 2023-02-06: qty 350

## 2023-02-06 MED ORDER — SODIUM CHLORIDE 0.9 % IV SOLN
2.0000 g | Freq: Three times a day (TID) | INTRAVENOUS | Status: DC
  Administered 2023-02-06 – 2023-02-09 (×11): 2 g via INTRAVENOUS
  Filled 2023-02-06 (×13): qty 12.5

## 2023-02-06 NOTE — Progress Notes (Signed)
Pharmacy Antibiotic Note  Jodi Forbes is a 57 y.o. female admitted on 02/02/2023 with past medical history of hepatic adenoma status postsurgical resections in the past at [facility], presented to hospital with sharp abdominal pain with some nausea and vomiting. Marland Kitchen  Pharmacy has been consulted to dose vancomycin and cefepime for sepsis.  Plan: Vancomycin '1750mg'$  IV x 1 then '1250mg'$  q24h (AUC 472.7, Scr 0.8) Cefepime 2gm IV q8h Follow renal function, cultures and clinical course  Height: '5\' 4"'$  (162.6 cm) Weight: 86.2 kg (190 lb) IBW/kg (Calculated) : 54.7  Temp (24hrs), Avg:100.8 F (38.2 C), Min:98.6 F (37 C), Max:103.1 F (39.5 C)  Recent Labs  Lab 02/02/23 1254 02/03/23 0419 02/04/23 0416 02/05/23 0451  WBC 10.9* 12.4* 14.2* 20.1*  CREATININE 0.66 0.58 0.73 0.57    Estimated Creatinine Clearance: 84.4 mL/min (by C-G formula based on SCr of 0.57 mg/dL).    No Known Allergies  Antimicrobials this admission: 2/8 vanc >> 2/8 cefepime >> 2/8 flagyl x 1  Dose adjustments this admission:   Microbiology results: 2/8 BCx:   Thank you for allowing pharmacy to be a part of this patient's care.  Dolly Rias RPh 02/06/2023, 12:16 AM

## 2023-02-06 NOTE — Progress Notes (Signed)
Subjective: CC: Patient tolerated NGT clamping yesterday. Residuals ~50cc. She denied nausea and reported stable/improved abdominal pain, no longer requiring prn pain medication. Also had large loose bm this afternoon. NGT was d/c'd and she was started on liquids.   Reports she did well with liquids yesterday into this morning. No increased abdominal pain, nausea or vomiting. Notes 3 bm's since yesterday morning that were loose.   Had fever to 103.1 overnight with tachycardia. No hypotension. On RA.  Given fluid bolus. CXR negative. Lactic wnl. UA pending. TRH has ordered CT CAP. Blood cx's obtained. She was started on broad spectrum abx (Cefepime/flagyl/vanc).   She is afebrile this am. HR 110's. No hypotension. On RA. She denies URI symptoms (no nasal congestion, sore throat, chest pain, sob, cough). Reports stable/improved central abdominal pain from her epigastrium to umbilicus. No lower abdominal pain today. Still tolerating po without increased abdominal pain, n/v. Having bm's as noted above. Denies urinary symptoms (dysuria, hematuria, foul odor, change in frequency) but RN did report change in color. She denies LE pain or swelling.   Objective: Vital signs in last 24 hours: Temp:  [98.6 F (37 C)-103.1 F (39.5 C)] 100.2 F (37.9 C) (02/08 0639) Pulse Rate:  [110-122] 110 (02/08 0639) Resp:  [18] 18 (02/08 0639) BP: (135-169)/(73-98) 146/81 (02/08 0639) SpO2:  [95 %-97 %] 97 % (02/08 0639) Last BM Date : 02/05/23  Intake/Output from previous day: 02/07 0701 - 02/08 0700 In: 4774.9 [P.O.:840; I.V.:994.5; IV Piggyback:2940.4] Out: 951 [Urine:950; Stool:1] Intake/Output this shift: No intake/output data recorded.  PE: Gen:  Alert, NAD, pleasant Card:  Tachycardic with regular rhythm Pulm:  CTAB, no W/R/R, effort normal Abd: She appears to have mild distension which actually appears improved from yesterday. Her abdomen remains soft. Central abdominal ttp of the  epigastrium and supraumbilical abdomen without rigidity or guarding that also appears improved from yesterday. Otherwise NT. + bowel sounds today. Prior upper midline and R transverse abdominal scars are well healed.  Ext:  No LE edema or calf tenderness Psych: A&Ox3   Lab Results:  Recent Labs    02/05/23 0451 02/06/23 0106  WBC 20.1* 16.9*  HGB 11.3* 9.6*  HCT 36.7 30.7*  PLT 302 270   BMET Recent Labs    02/05/23 0451 02/06/23 0106  NA 141 136  K 3.2* 3.3*  CL 107 105  CO2 24 22  GLUCOSE 114* 102*  BUN 21* 12  CREATININE 0.57 0.55  CALCIUM 8.5* 8.0*   PT/INR Recent Labs    02/06/23 0106  LABPROT 16.8*  INR 1.4*   CMP     Component Value Date/Time   NA 136 02/06/2023 0106   K 3.3 (L) 02/06/2023 0106   CL 105 02/06/2023 0106   CO2 22 02/06/2023 0106   GLUCOSE 102 (H) 02/06/2023 0106   BUN 12 02/06/2023 0106   CREATININE 0.55 02/06/2023 0106   CALCIUM 8.0 (L) 02/06/2023 0106   PROT 6.3 (L) 02/06/2023 0106   ALBUMIN 2.6 (L) 02/06/2023 0106   AST 119 (H) 02/06/2023 0106   ALT 281 (H) 02/06/2023 0106   ALKPHOS 231 (H) 02/06/2023 0106   BILITOT 1.4 (H) 02/06/2023 0106   GFRNONAA >60 02/06/2023 0106   GFRAA >60 01/08/2019 0609   Lipase     Component Value Date/Time   LIPASE 24 02/02/2023 1254    Studies/Results: DG Chest 1 View  Result Date: 02/06/2023 CLINICAL DATA:  Sepsis EXAM: CHEST  1 VIEW COMPARISON:  01/06/2019 FINDINGS: Heart and mediastinal contours are within normal limits. No focal opacities or effusions. No acute bony abnormality. IMPRESSION: No active disease. Electronically Signed   By: Rolm Baptise M.D.   On: 02/06/2023 00:37   DG Abd 2 Views  Result Date: 02/05/2023 CLINICAL DATA:  Small bowel obstruction. EXAM: ABDOMEN - 2 VIEW COMPARISON:  February 04, 2023. FINDINGS: Distal tip of nasogastric tube is seen in expected position of distal stomach. Residual contrast is noted in nondilated colon. No small bowel dilatation is noted. Calcified  uterine fibroid is noted in the pelvis. IMPRESSION: No abnormal bowel dilatation. Electronically Signed   By: Marijo Conception M.D.   On: 02/05/2023 08:40   DG Abd Portable 1V-Small Bowel Obstruction Protocol-initial, 8 hr delay  Result Date: 02/04/2023 CLINICAL DATA:  Small-bowel obstruction EXAM: PORTABLE ABDOMEN - 1 VIEW COMPARISON:  Earlier films of the same day FINDINGS: Nasogastric tube extends to the gastric antrum. There is hyperdense material in the nondilated stomach and in multiple loops of small bowel which are mildly distended but nondilated. Colon remains decompressed. Peripherally calcified uterine fibroid in the left pelvis. Regional bones unremarkable. IMPRESSION: 1. Nonobstructive bowel gas pattern. 2. Nasogastric tube in the gastric antrum. Electronically Signed   By: Lucrezia Europe M.D.   On: 02/04/2023 20:02    Anti-infectives: Anti-infectives (From admission, onward)    Start     Dose/Rate Route Frequency Ordered Stop   02/07/23 0200  vancomycin (VANCOREADY) IVPB 1250 mg/250 mL        1,250 mg 166.7 mL/hr over 90 Minutes Intravenous Every 24 hours 02/06/23 0334     02/06/23 0800  ceFEPIme (MAXIPIME) 2 g in sodium chloride 0.9 % 100 mL IVPB        2 g 200 mL/hr over 30 Minutes Intravenous Every 8 hours 02/06/23 0103     02/06/23 0100  ceFEPIme (MAXIPIME) 2 g in sodium chloride 0.9 % 100 mL IVPB        2 g 200 mL/hr over 30 Minutes Intravenous  Once 02/06/23 0006 02/06/23 0051   02/06/23 0100  metroNIDAZOLE (FLAGYL) IVPB 500 mg        500 mg 100 mL/hr over 60 Minutes Intravenous Every 12 hours 02/06/23 0006 02/13/23 0059   02/06/23 0100  vancomycin (VANCOCIN) IVPB 1000 mg/200 mL premix  Status:  Discontinued        1,000 mg 200 mL/hr over 60 Minutes Intravenous  Once 02/06/23 0006 02/06/23 0011   02/06/23 0100  vancomycin (VANCOREADY) IVPB 1750 mg/350 mL        1,750 mg 175 mL/hr over 120 Minutes Intravenous  Once 02/06/23 0011 02/06/23 0443         Assessment/Plan SBO - CT on admission w/ dilated mid small bowel in the central left hemiabdomen with transition in midline ventral abdomen. Hx left lateral and partial right hepatectomy's as well as a cholecystectomy.  - Xray yesterday very reassuring with no obvious dilated small bowel and contrast in colon. She has return of bowel function. NGT was able to be removed and she is tolerating cld without increased abdominal pain, n/v. Exam is stable to improved as above. WBC down at 16.9 from 20.1. Given fever overnight, agree with [facility] plan for CT A/P with IV and PO contrast. Her lactic was wnl. Will follow up on results. Can keep NPO for now. She is undergoing w/u by [facility] to rule out other sources of fever.    FEN - NPO as above. IVF  per TRH. K 3.2 (getting replaced) VTE - SCDs, okay for chem ppx from a general surgery standpoint ID - Cefepime/Vanc/Flagyl. Tmax 103.1. Tachycardic. No hypotension. Lactic wnl. WBC down at 16.9.  I reviewed nursing notes, hospitalist notes, last 24 h vitals and pain scores, last 48 h intake and output, last 24 h labs and trends, and last 24 h imaging results.   LOS: 4 days    Jillyn Ledger , [facility] 02/06/2023, 7:07 AM Please see Amion for pager number during day hours 7:00am-4:30pm

## 2023-02-06 NOTE — Progress Notes (Addendum)
  [facility] CROSS COVERING REPORT   BRIEF HPI Jodi Forbes is a 57 y.o. 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  SUBJECTIVE Overnight patient developed worsening fevers up to 103.1 as below and was also noted to be Tachy in the 120's. Given worsening leukocytosis and fevers concerning for possible sepsis,  patient given 30 cc/kg of fluids and started on broad-spectrum antibiotics Vanco cefepime and Flagyl   OBJECTIVE Temp (24hrs), Avg:100.8 F (38.2 C), Min:98.6 F (37 C), Max:103.1 F (39.5 C)   Last Labs        Recent Labs  Lab 02/02/23 1254 02/03/23 0419 02/04/23 0416 02/05/23 0451  WBC 10.9* 12.4* 14.2* 20.1*  CREATININE 0.66 0.58 0.73 0.57      Antimicrobials  2/8 vanc >> 2/8 cefepime >> 2/8 flagyl x 1   Microbiology results: 2/8 BCx:    ASSESSMENT AND PLAN  #Sepsis due to suspected Intraabdominal Source meets SIRS criteria: Heart Rate 120 beats/minute, Respiratory Rate 22 breaths/minute,Temperature 103.1 -Supplemental oxygen as needed, to maintain SpO2 > 90% -Repeat xray negative -F/u cultures, trend lactic/ PCT -Monitor WBC/ fever curve -Start broad spectrum IV antibiotics: cefepime & vancomycin & Flagyl narrowing as source becomes more clear -IVF hydration as needed:  -Strict I/O's      Rufina Falco, DNP, CCRN, FNP-C, AGACNP-BC Acute Care & Family Nurse Practitioner  [facility]  See Amion for personal pager PCCM on call pager [phone] until 7 am

## 2023-02-06 NOTE — Consult Note (Signed)
Chief Complaint: Patient was seen in consultation today for hemorrhagic liver mass Chief Complaint  Patient presents with   Abdominal Pain   at the request of [name]  Referring Physician(s): Alferd Apa PA-C CCS  Supervising Physician: Corrie Mckusick  Patient Status: [name] - In-pt  History of Present Illness: Jodi Forbes is a 57 y.o. 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   Patient is known to IR service for right liver lesion bx in 2020, procedure was complicated by post bx bleeding.    She came to ED on 2/4 due to abd pain, had mildly elevated WBCc and plt and normal LFTs. CT in ED showed partial or developing small bowel obstruction, patient was admitted for further eval and management, general surgery has been following. Patient's abdominal sx appeared to improve with NGT and bowel rest, but she continued to have leukocytosis and develop fever this morning, CMP today showed markedly elevated LFTs with AST 119, ALT 281, alk phos 231 and T bili 1.2. Hgb dropped from 11.3 yesterday to 9.6. Normotensive but tachycardia but she has been tachycardic for couple days. CT CAP with contrast was obtained for further eval which showed:  1. Question of hemorrhagic conversion of dominant RIGHT hepatic lobe mass, previously characterized as adenoma. Small focus of enhancement medial to the mass is suspicious for active extravasation. See key image. 2. Trace LEFT perirenal and anterior pararenal space fat stranding is nonspecific. Early pyelonephritis can appear similar. Correlate with urinalysis. 3. Bilateral 8 mm part-solid pulmonary nodules. Per Fleischner Society Guidelines, recommend a non-contrast Chest CT at 3-6 months. These guidelines do not apply to immunocompromised patients and patients  with cancer. Reference: Radiology. 2017; 284(1):228-43.  IR was consulted for possible intervention, case reviewed and approved for emergent angiogram and possible embolization by Dr. Earleen Newport.  Patient currently in the unit where does not accept arterial puncture, bed control and ICU were reached out and patient will be transferred to [facility] after the embolization.   Patient laying in bed, not in acute distress.  Repost mild abdominal pain today, she has known SBO.  Denise headache, fever, chills, shortness of breath, cough, chest pain,nausea ,vomiting, and bleeding.    Past Medical History:  Diagnosis Date   Medical history non-contributory    Psoriasis 2013   bx'd by dermatology per report   Tumor liver     Past Surgical History:  Procedure Laterality Date   CHOLECYSTECTOMY     IR US GUIDE BX ASP/DRAIN  01/01/2019   TUMOR REMOVAL      Allergies: Patient has no known allergies.  Medications: Prior to Admission medications   Medication Sig Start Date End Date Taking? Authorizing Provider  acetaminophen (TYLENOL) 325 MG tablet Take 2 tablets (650 mg total) by mouth every 6 (six) hours as needed for mild pain (or Fever >/= 101). Patient not taking: Reported on 02/02/2023 01/09/19   [name], MD  ceFEPIme 1 g in sodium chloride 0.9 % 100 mL Inject 1 g into the vein every 8 (eight) hours. Patient not taking: Reported on 02/02/2023 01/09/19   [name], MD  hydrALAZINE (APRESOLINE) 20 MG/ML injection Inject 0.5 mLs (10 mg total) into the vein every 6 (six) hours as needed (SBP more than 150). Patient not taking: Reported on 02/02/2023 01/09/19   [name], MD  HYDROmorphone (DILAUDID) 1 MG/ML injection  Inject 0.5 mLs (0.5 mg total) into the vein every 2 (two) hours as needed for moderate pain. Patient not taking: Reported on 02/02/2023 01/09/19   [name], MD  levalbuterol Penne Lash) 0.63 MG/3ML nebulizer solution Take 3 mLs (0.63 mg total) by nebulization every 6  (six) hours as needed for wheezing or shortness of breath. Patient not taking: Reported on 02/02/2023 01/09/19   [name], MD  LORazepam (ATIVAN) 0.5 MG tablet Take 1 tablet (0.5 mg total) by mouth every 6 (six) hours as needed for anxiety. Patient not taking: Reported on 02/02/2023 01/09/19   [name], MD  mouth rinse LIQD solution 15 mLs by Mouth Rinse route 2 (two) times daily. Patient not taking: Reported on 02/02/2023 01/09/19   [name], MD  ondansetron (ZOFRAN) 4 MG tablet Take 1 tablet (4 mg total) by mouth every 6 (six) hours as needed for nausea. Patient not taking: Reported on 02/02/2023 01/09/19   [name], MD  oxyCODONE (OXY IR/ROXICODONE) 5 MG immediate release tablet Take 1-2 tablets (5-10 mg total) by mouth every 6 (six) hours as needed for moderate pain. Patient not taking: Reported on 02/02/2023 01/09/19   [name], MD  pantoprazole (PROTONIX) 40 MG injection Inject 40 mg into the vein every 12 (twelve) hours. Patient not taking: Reported on 02/02/2023 01/09/19   [name], MD  polyethylene glycol [facility] / Floria Raveling) packet Take 17 g by mouth daily as needed for mild constipation. Patient not taking: Reported on 02/02/2023 01/09/19   [name], MD  senna-docusate (SENOKOT-S) 8.6-50 MG tablet Take 1 tablet by mouth at bedtime. Patient not taking: Reported on 02/02/2023 01/09/19   [name], MD     Family History  Problem Relation Age of Onset   Diabetes Mother    CAD Father    Breast cancer Maternal Grandmother    Colon cancer Maternal Grandfather    Pancreatic cancer Other        Maternal Great Uncle    Social History   Socioeconomic History   Marital status: Single    Spouse name: Not on file   Number of children: Not on file   Years of education: Not on file   Highest education level: Not on file  Occupational History   Not on file  Tobacco Use   Smoking status: Never   Smokeless tobacco: Never  Vaping Use   Vaping Use: Never  used  Substance and Sexual Activity   Alcohol use: Never   Drug use: Never   Sexual activity: Not on file  Other Topics Concern   Not on file  Social History Narrative   Not on file   Social Determinants of Health   Financial Resource Strain: Not on file  Food Insecurity: No Food Insecurity (02/02/2023)   Hunger Vital Sign    Worried About Running Out of Food in the Last Year: Never true    [place] in the Last Year: Never true  Transportation Needs: No Transportation Needs (02/02/2023)   PRAPARE - Hydrologist (Medical): No    Lack of Transportation (Non-Medical): No  Physical Activity: Not on file  Stress: Not on file  Social Connections: Not on file     Review of Systems: A 12 point ROS discussed and pertinent positives are indicated in the HPI above.  All other systems are negative.  Vital Signs: BP 131/73 (BP Location: Right Arm)   Pulse (!) 109   Temp 99.8 F (37.7  C) (Oral)   Resp 18   Ht '5\' 4"'$  (1.626 m)   Wt 190 lb (86.2 kg)   SpO2 98%   BMI 32.61 kg/m    Physical Exam Vitals and nursing note reviewed.  Constitutional:      General: She is not in acute distress.    Appearance: She is not ill-appearing.  HENT:     Head: Normocephalic.  Cardiovascular:     Rate and Rhythm: Regular rhythm. Tachycardia present.  Pulmonary:     Effort: Pulmonary effort is normal.     Breath sounds: Normal breath sounds.  Abdominal:     General: Abdomen is flat. Bowel sounds are normal.     Palpations: Abdomen is soft.  Skin:    General: Skin is warm and dry.     Coloration: Skin is not cyanotic or jaundiced.  Neurological:     Mental Status: She is alert and oriented to person, place, and time.  Psychiatric:        Mood and Affect: Mood normal.        Behavior: Behavior normal.     MD Evaluation Airway: WNL Heart: WNL Abdomen: WNL Chest/ Lungs: WNL ASA  Classification: 3 Mallampati/Airway Score: Two  Imaging: CT CHEST  ABDOMEN PELVIS W CONTRAST  Result Date: 02/06/2023 CLINICAL DATA:  Sepsis fever, abdominal pain, history of hepatic adenoma in addition to bowel obstruction, rising leukocytosis. EXAM: CT CHEST, ABDOMEN, AND PELVIS WITH CONTRAST TECHNIQUE: Multidetector CT imaging of the chest, abdomen and pelvis was performed following the standard protocol during bolus administration of intravenous contrast. RADIATION DOSE REDUCTION: This exam was performed according to the departmental dose-optimization program which includes automated exposure control, adjustment of the mA and/or kV according to patient size and/or use of iterative reconstruction technique. CONTRAST:  115m OMNIPAQUE IOHEXOL 300 MG/ML  SOLN COMPARISON:  CT AP, 02/02/2023 and 03/26/2021. Chest XR, concurrent. FINDINGS: CT CHEST FINDINGS Cardiovascular: No acute intrathoracic vascular abnormality. Normal heart size. No pericardial effusion. Mediastinum/Nodes: No enlarged mediastinal, hilar, or axillary lymph nodes. Thyroid gland, trachea, and esophagus demonstrate no significant findings. Lungs/Pleura: Bilateral 8 mm subsolid pulmonary nodules, within the LEFT upper and RIGHT lower lobes. Additional 5 mm nodularity along the RIGHT major fissure is likely to represent an intrapulmonary lymph node. Lungs are otherwise clear without focal consolidation or mass. No pleural effusion or pneumothorax. Musculoskeletal: No acute chest wall mass or suspicious bone lesion. CT ABDOMEN PELVIS FINDINGS Hepatobiliary: *Dominant RIGHT superior hepatic lobe mass appears heterogeneously dense with new prominent hypodense component. *The mass measures approximately 8.0 x 10.2 x 10.0 cm (AP by transaxial by CC), previously 9.9 x 12.8 (AP by transaxial). *Question enhancing focus medial to the mass, suspicious for angiographic spot sign and active extravasation. *Postsurgical changes of prior RIGHT and LEFT liver mass resections. Status post cholecystectomy. No biliary dilatation.  Pancreas: No pancreatic ductal dilatation. Spleen: Normal in size without focal abnormality. Adrenals/Urinary Tract: Adrenal glands are unremarkable. Kidneys are normal, without renal calculi, focal lesion, or hydronephrosis. Bladder is unremarkable. Stomach/Bowel: Stomach is within normal limits. Appendix appears normal. Nonobstructed small bowel. Nondilated colon. Contrast opacification of distal small bowel and colon without extraluminal extravasation. No evidence of bowel wall thickening, distention, or inflammatory changes. Vascular/Lymphatic: No significant vascular findings are present. No enlarged abdominal or pelvic lymph nodes. Reproductive: Peripherally-calcified RIGHT fibroid. Prominent ovarian follicles. No adnexal mass. Other: Trace perirenal, peripancreatic and anterior pararenal space fat stranding. Trace body wall edema. No abdominal wall hernia or abnormality. No abdominopelvic ascites.  Musculoskeletal: No acute or significant osseous findings. IMPRESSION: 1. Question of hemorrhagic conversion of dominant RIGHT hepatic lobe mass, previously characterized as adenoma. Small focus of enhancement medial to the mass is suspicious for active extravasation. See key image. 2. Trace LEFT perirenal and anterior pararenal space fat stranding is nonspecific. Early pyelonephritis can appear similar. Correlate with urinalysis. 3. Bilateral 8 mm part-solid pulmonary nodules. Per Fleischner Society Guidelines, recommend a non-contrast Chest CT at 3-6 months. These guidelines do not apply to immunocompromised patients and patients with cancer. Reference: Radiology. 2017; 284(1):228-43. These results were called by telephone at the time of interpretation on 02/06/2023 at 3:46 pm to provider [facility] , who verbally acknowledged these results. Electronically Signed   By: Michaelle Birks M.D.   On: 02/06/2023 15:51   DG Chest 1 View  Result Date: 02/06/2023 CLINICAL DATA:  Sepsis EXAM: CHEST  1 VIEW COMPARISON:   01/06/2019 FINDINGS: Heart and mediastinal contours are within normal limits. No focal opacities or effusions. No acute bony abnormality. IMPRESSION: No active disease. Electronically Signed   By: Rolm Baptise M.D.   On: 02/06/2023 00:37   DG Abd 2 Views  Result Date: 02/05/2023 CLINICAL DATA:  Small bowel obstruction. EXAM: ABDOMEN - 2 VIEW COMPARISON:  February 04, 2023. FINDINGS: Distal tip of nasogastric tube is seen in expected position of distal stomach. Residual contrast is noted in nondilated colon. No small bowel dilatation is noted. Calcified uterine fibroid is noted in the pelvis. IMPRESSION: No abnormal bowel dilatation. Electronically Signed   By: Marijo Conception M.D.   On: 02/05/2023 08:40   DG Abd Portable 1V-Small Bowel Obstruction Protocol-initial, 8 hr delay  Result Date: 02/04/2023 CLINICAL DATA:  Small-bowel obstruction EXAM: PORTABLE ABDOMEN - 1 VIEW COMPARISON:  Earlier films of the same day FINDINGS: Nasogastric tube extends to the gastric antrum. There is hyperdense material in the nondilated stomach and in multiple loops of small bowel which are mildly distended but nondilated. Colon remains decompressed. Peripherally calcified uterine fibroid in the left pelvis. Regional bones unremarkable. IMPRESSION: 1. Nonobstructive bowel gas pattern. 2. Nasogastric tube in the gastric antrum. Electronically Signed   By: Lucrezia Europe M.D.   On: 02/04/2023 20:02   DG Abd 2 Views  Result Date: 02/04/2023 CLINICAL DATA:  Small-bowel obstruction. EXAM: ABDOMEN - 2 VIEW COMPARISON:  Abdomen 02/03/2023.  CT 02/02/2023. FINDINGS: NG tube in stable position. No bowel distention noted. Stool is noted the colon. No free air noted. Surgical sutures and dystrophic calcifications again noted over the upper abdomen. Calcified fibroid again noted. Pelvic calcifications consistent phleboliths. No acute bony abnormality. IMPRESSION: NG tube noted stable position. No bowel distention or free air. Exam stable from  prior exam. Electronically Signed   By: Marcello Moores  Register M.D.   On: 02/04/2023 05:44   DG Abd Portable 1V-Small Bowel Obstruction Protocol-initial, 8 hr delay  Result Date: 02/03/2023 CLINICAL DATA:  Small-bowel obstruction, 8 hour delay. EXAM: PORTABLE ABDOMEN - 1 VIEW COMPARISON:  None Available. FINDINGS: The bowel gas pattern is normal. Enteric tube terminates in the stomach. Calcified fibroid is noted in the pelvis. No radio-opaque calculi or other significant radiographic abnormality are seen. IMPRESSION: 1. No evidence of small-bowel obstruction. 2. NG tube terminates in the stomach. Electronically Signed   By: Brett Fairy M.D.   On: 02/03/2023 02:36   DG Abdomen 1 View  Result Date: 02/02/2023 CLINICAL DATA:  NG tube placement EXAM: ABDOMEN - 1 VIEW COMPARISON:  02/02/23 FINDINGS: 0 there is  been interval placement of in the anterior to. The tip and side port are well below the GE junction. Tip is in the expected location of the distal stomach. Dilated loop of small bowel is noted within the imaged portion of the mid abdomen. IMPRESSION: Interval placement of NG tube with tip in the distal stomach. Electronically Signed   By: Kerby Moors M.D.   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 EXAM: CT ABDOMEN AND PELVIS WITH CONTRAST TECHNIQUE: Multidetector CT imaging of the abdomen and pelvis was performed using the standard protocol following bolus administration of intravenous contrast. RADIATION DOSE REDUCTION: This exam was performed according to the departmental dose-optimization program which includes automated exposure control, adjustment of the mA and/or kV according to patient size and/or use of iterative reconstruction technique. CONTRAST:  128m OMNIPAQUE IOHEXOL 300 MG/ML  SOLN COMPARISON:  03/26/2021 FINDINGS: Lower chest: No acute abnormality.  Hepatobiliary: Multiple large, hypodense liver lesions are not appreciably changed, largest again in the liver dome measuring 12.8 x 9.9 cm (series 2, image 16). Evidence of prior left and right partial resections. Status post cholecystectomy. No biliary ductal dilatation. Pancreas: Unremarkable. No pancreatic ductal dilatation or surrounding inflammatory changes. Spleen: Normal in size without significant abnormality. Adrenals/Urinary Tract: Adrenal glands are unremarkable. Kidneys are normal, without renal calculi, solid lesion, or hydronephrosis. Bladder is unremarkable. Stomach/Bowel: Stomach is within normal limits. Appendix appears normal. Air and fluid-filled loop of mid small bowel in the central left hemiabdomen, with an abrupt transition point in the midline ventral abdomen (series 2, image 57, series 5, image 67). This loop measures no greater than 3.0 cm in caliber but appears mildly distended. Vascular/Lymphatic: No significant vascular findings are present. No enlarged abdominal or pelvic lymph nodes. Reproductive: Uterine fibroids. Other: No abdominal wall hernia or abnormality. No ascites. Musculoskeletal: No acute or significant osseous findings. IMPRESSION: 1. Air and fluid-filled loop of mid small bowel in the central left hemiabdomen, with an abrupt transition point in the midline ventral abdomen. This loop measures no greater than 3.0 cm in caliber but appears mildly distended. Findings are consistent with partial or developing small bowel obstruction. 2. Multiple large, hypodense liver lesions are not appreciably changed, largest again in the liver dome measuring 12.8 x 9.9 cm. Evidence of prior left and right partial resections. By report, these have been previously characterized as hepatic adenomas, and there is no evidence of complicating hemorrhage or other acute associated finding at this time. 3. Uterine fibroids. Electronically Signed   By: ADelanna AhmadiM.D.   On: 02/02/2023 14:33     Labs:  CBC: Recent Labs    02/04/23 0416 02/05/23 0451 02/06/23 0106 02/06/23 1615  WBC 14.2* 20.1* 16.9* 15.6*  HGB 11.3* 11.3* 9.6* 9.3*  HCT 35.6* 36.7 30.7* 29.6*  PLT 360 302 270 260    COAGS: Recent Labs    02/06/23 0106  INR 1.4*  APTT 34    BMP: Recent Labs    02/03/23 0419 02/04/23 0416 02/05/23 0451 02/06/23 0106  NA 137 139 141 136  K 3.6 3.6 3.2* 3.3*  CL 103 104 107 105  CO2 '23 25 24 22  '$ GLUCOSE 118* 104* 114* 102*  BUN 16 22* 21* 12  CALCIUM 9.1 8.8* 8.5* 8.0*  CREATININE 0.58 0.73 0.57 0.55  GFRNONAA >60 >60 >60 >60    LIVER FUNCTION TESTS:  Recent Labs    02/02/23 1254 02/03/23 0419 02/06/23 0106  BILITOT 0.9 1.0 1.4*  AST 21 44* 119*  ALT 19 35 281*  ALKPHOS 263* 230* 231*  PROT 7.6 7.4 6.3*  ALBUMIN 3.6 3.5 2.6*    TUMOR MARKERS: No results for input(s): "AFPTM", "CEA", "CA199", "CHROMGRNA" in the last 8760 hours.  Assessment and Plan: 57 y.o. 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   NPO since 8 am today  VS tachycardic but normotensive  Hgb 9.6, plt 270, INR 1.4 today  Rf normal  Not allergic to contrast   Risks and benefits of hepatic artery angiogram with possible embolization were discussed with the patient including, but not limited to bleeding, infection, vascular injury or contrast induced renal failure.  This interventional procedure involves the use of X-rays and because of the nature of the planned procedure, it is possible that we will have prolonged use of X-ray fluoroscopy.  Potential radiation risks to you include (but are not limited to) the following: - A slightly elevated risk for cancer  several years later in life. This risk is typically less than 0.5% percent. This risk is low in comparison to the normal incidence of human cancer, which is 33% for women and 50% for men according to the [place]. - Radiation induced injury can include skin redness, resembling a rash, tissue breakdown / ulcers and hair loss (which can be temporary or permanent).   The likelihood of either of these occurring depends on the difficulty of the procedure and whether you are sensitive to radiation due to previous procedures, disease, or genetic conditions.   IF your procedure requires a prolonged use of radiation, you will be notified and given written instructions for further action.  It is your responsibility to monitor the irradiated area for the 2 weeks following the procedure and to notify your physician if you are concerned that you have suffered a radiation induced injury.    All of the patient's questions were answered, patient is agreeable to proceed.  Consent signed and in chart.   Thank you for this interesting consult.  I greatly enjoyed meeting Kalysta Kneisley and look forward to participating in their care.  A copy of this report was sent to the requesting provider on this date.  Electronically Signed: Tera Mater, PA-C 02/06/2023, 5:06 PM   I spent a total of 20 Minutes    in face to face in clinical consultation, greater than 50% of which was counseling/coordinating care for hepatic angiogram and possible embolization.   This chart was dictated using voice recognition software.  Despite best efforts to proofread,  errors can occur which can change the documentation meaning.

## 2023-02-06 NOTE — Progress Notes (Signed)
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  Symptoms did not improve so she decided to come to the hospital.  In the ED, patient had generalized abdominal tenderness.  Initial blood pressure was elevated.  CBC showed a WBC of 10.9.  BMP showed creatinine at 0.6.  Lipase of 24.  CT scan of the abdomen and pelvis was done which showed air and fluid filled loops of small bowel in the central left hemiabdomen with appropriate transition with multiple large hypodense liver lesions largest one up to 12.8 x 9.9 cm.  General surgery was consulted and patient was admitted hospital for further evaluation and treatment.  Assessment and Plan:   Partial small bowel obstruction. General surgery on board.  Patient was on supportive care including n.p.o. IV fluids NG tube initially and now has been discontinued and advanced on oral diet.   Status post Gastrografin study with contrast in the colon..  Clinically improving  New fever with leukocytosis.   Temperature max of 103.1 F.  Leukocytosis with a WBC at 16.9, initial WBC at 10.9.  Urinalysis negative.   Initial chest x-ray showed some patchy opacities over the right lower lung but a repeat chest x-ray negative for acute infiltrate.  Continue incentive spirometry.  Follow-up blood culture sent, procalcitonin was 0.8.  Patient has been empirically started on Vanco, cefepime and Flagyl for possible intra-abdominal source.   Lactate was 0.7.  Will get urinalysis with urine culture.  Add respiratory viral panel. Spoke with general surgery and will get CT chest abdomen and pelvis with oral contrast to rule out any intra-abdominal etiology for fever.     History of hepatic adenomas Elevated LFT likely from hepatic adenomas.  Largest one at 12.8 x 9.9 cm.  Patient stated that she did have multiple hepatic adenoma resections in the past.  Continue analgesia and focus on pain relief.  Follow surgical recommendation.  INR elevated at 1.4.  Hypokalemia will continue to replenish through IV.  Potassium today at 3.3.  Check levels in am.   DVT prophylaxis: SCDs Start: 02/02/23 1703 is a 39 schertz discussed   Code Status:     Code Status: Full Code  Disposition:  Likely home 1 to 2 days pending upon clinical improvement and surgical input.  Status is: Inpatient  Remains inpatient appropriate because: Small bowel obstruction, IV fluids, pending clinical improvement, rising leukocytosis with fever, pending clinical improvement.   Family Communication:  Spoke with the patient's mother at bedside on 02/06/2023  Consultants:  General surgery  Procedures:  NG tube placement and removal  Antimicrobials:  Vancomycin, cefepime and Flagyl.  Anti-infectives (From admission, onward)    Start     Dose/Rate Route Frequency Ordered Stop   02/07/23 0200  vancomycin (VANCOREADY) IVPB 1250 mg/250 mL        1,250 mg 166.7 mL/hr over 90 Minutes Intravenous Every 24 hours 02/06/23 0334     02/06/23 0800  ceFEPIme (MAXIPIME) 2 g in sodium chloride 0.9 % 100 mL IVPB        2 g 200 mL/hr over 30 Minutes Intravenous Every 8 hours 02/06/23 0103     02/06/23 0100  ceFEPIme (MAXIPIME) 2 g in sodium  chloride 0.9 % 100 mL IVPB        2 g 200 mL/hr over 30 Minutes Intravenous  Once 02/06/23 0006 02/06/23 0051   02/06/23 0100  metroNIDAZOLE (FLAGYL) IVPB 500 mg        500 mg 100 mL/hr over 60 Minutes Intravenous Every 12 hours 02/06/23 0006 02/13/23 0059   02/06/23 0100  vancomycin (VANCOCIN) IVPB 1000 mg/200 mL premix  Status:  Discontinued        1,000 mg 200 mL/hr over 60 Minutes Intravenous  Once 02/06/23 0006 02/06/23 0011   02/06/23 0100  vancomycin  (VANCOREADY) IVPB 1750 mg/350 mL        1,750 mg 175 mL/hr over 120 Minutes Intravenous  Once 02/06/23 0011 02/06/23 0443      Subjective:  Today, patient patient was seen and examined at bedside. Patient denies nausea and vomiting  Has had bowel movement.  Was able to tolerate orally.  CT however did have a high-grade fever.  Denies any urinary symptoms are respiratory symptoms.  Does complain of abdominal pain.  Objective: Vitals:   02/06/23 0038 02/06/23 0140 02/06/23 0235 02/06/23 0639  BP: (!) 169/98 135/73 136/77 (!) 146/81  Pulse: (!) 110 (!) 110 (!) 110 (!) 110  Resp: '18 18 18 18  '$ Temp: (!) 101.5 F (38.6 C) (!) 101.1 F (38.4 C) (!) 100.4 F (38 C) 100.2 F (37.9 C)  TempSrc: Oral Oral Oral Oral  SpO2: 95% 96% 97% 97%  Weight:      Height:        Intake/Output Summary (Last 24 hours) at 02/06/2023 0716 Last data filed at 02/06/2023 0600 Gross per 24 hour  Intake 4774.88 ml  Output 951 ml  Net 3823.88 ml    Filed Weights   02/02/23 1253  Weight: 86.2 kg    Physical Examination: Body mass index is 32.61 kg/m.   General: Alert and awake, oriented.  Obese, off NG tube HENT:   No scleral pallor or icterus noted. Oral mucosa is moist.  Chest:  Clear breath sounds.  Diminished breath sounds bilaterally. No crackles or wheezes.  CVS: S1 &S2 heard. No murmur.  Regular rate and rhythm. Abdomen: Soft, mild distention with nonspecific tenderness.  tbowel sounds not heard. Extremities: No cyanosis, clubbing or edema.  Peripheral pulses are palpable. Psych: Alert, awake and oriented, normal mood CNS:  No cranial nerve deficits.  Power equal in all extremities.   Skin: Warm and dry.  No rashes noted.  Data Reviewed:   CBC: Recent Labs  Lab 02/02/23 1254 02/03/23 0419 02/04/23 0416 02/05/23 0451 02/06/23 0106  WBC 10.9* 12.4* 14.2* 20.1* 16.9*  NEUTROABS  --   --   --   --  13.0*  HGB 12.0 12.0 11.3* 11.3* 9.6*  HCT 36.9 37.3 35.6* 36.7 30.7*  MCV 88.9 90.3  91.3 92.9 94.2  PLT 406* 406* 360 302 270     Basic Metabolic Panel: Recent Labs  Lab 02/02/23 1254 02/03/23 0419 02/04/23 0416 02/05/23 0451 02/06/23 0106 02/06/23 0118  NA 136 137 139 141 136  --   K 3.9 3.6 3.6 3.2* 3.3*  --   CL 105 103 104 107 105  --   CO2 20* '23 25 24 22  '$ --   GLUCOSE 128* 118* 104* 114* 102*  --   BUN 19 16 22* 21* 12  --   CREATININE 0.66 0.58 0.73 0.57 0.55  --   CALCIUM 9.0 9.1 8.8* 8.5* 8.0*  --  MG  --   --  2.0 2.3  --  1.7     Liver Function Tests: Recent Labs  Lab 02/02/23 1254 02/03/23 0419 02/06/23 0106  AST 21 44* 119*  ALT 19 35 281*  ALKPHOS 263* 230* 231*  BILITOT 0.9 1.0 1.4*  PROT 7.6 7.4 6.3*  ALBUMIN 3.6 3.5 2.6*      Radiology Studies: DG Chest 1 View  Result Date: 02/06/2023 CLINICAL DATA:  Sepsis EXAM: CHEST  1 VIEW COMPARISON:  01/06/2019 FINDINGS: Heart and mediastinal contours are within normal limits. No focal opacities or effusions. No acute bony abnormality. IMPRESSION: No active disease. Electronically Signed   By: Rolm Baptise M.D.   On: 02/06/2023 00:37   DG Abd 2 Views  Result Date: 02/05/2023 CLINICAL DATA:  Small bowel obstruction. EXAM: ABDOMEN - 2 VIEW COMPARISON:  February 04, 2023. FINDINGS: Distal tip of nasogastric tube is seen in expected position of distal stomach. Residual contrast is noted in nondilated colon. No small bowel dilatation is noted. Calcified uterine fibroid is noted in the pelvis. IMPRESSION: No abnormal bowel dilatation. Electronically Signed   By: Marijo Conception M.D.   On: 02/05/2023 08:40   DG Abd Portable 1V-Small Bowel Obstruction Protocol-initial, 8 hr delay  Result Date: 02/04/2023 CLINICAL DATA:  Small-bowel obstruction EXAM: PORTABLE ABDOMEN - 1 VIEW COMPARISON:  Earlier films of the same day FINDINGS: Nasogastric tube extends to the gastric antrum. There is hyperdense material in the nondilated stomach and in multiple loops of small bowel which are mildly distended but  nondilated. Colon remains decompressed. Peripherally calcified uterine fibroid in the left pelvis. Regional bones unremarkable. IMPRESSION: 1. Nonobstructive bowel gas pattern. 2. Nasogastric tube in the gastric antrum. Electronically Signed   By: Lucrezia Europe M.D.   On: 02/04/2023 20:02      LOS: 4 days    Flora Lipps, MD [facility] Available via Epic secure chat 7am-7pm After these hours, please refer to coverage provider listed on [email or web address] 02/06/2023, 7:16 AM

## 2023-02-06 NOTE — Procedures (Addendum)
Vascular and Interventional Radiology Procedure Note  Patient: Jodi Forbes DOB: [birth year] Medical Record Number: [id number] Note Date/Time: 02/06/23 8:00 PM   Performing Physician: Michaelle Birks, MD Assistant(s): None  Diagnosis: Hemorrhagic liver mass  Procedure:  CELIAC and RIGHT HEPATIC ARTERIOGRAPHY GEL FOAM EMBOLIZATION of HEMORRHAGIC RIGHT HEPATIC LOBE MASS   Anesthesia: Conscious Sedation Complications: None Estimated Blood Loss: Minimal Specimens: None  Findings:  - access via the RIGHT femoral artery. - hepatic arteriography with active extravasation at large R hepatic lobe mass  - Gel foam embolization of branch R hepatic artery to stasis - AngioSeal closure at R groin - Palpable pulses at RLE at the end of the Case.  Plan: Post sheath removal precautions including RIGHT lower extremity slight straight x2 hours.  Serial abdominal examinations and H/h trend to evaluate for potential ongoing bleeding.   Final report to follow once all images are reviewed and compared with previous studies.  See detailed dictation with images in PACS. The patient tolerated the procedure well without incident or complication and was returned to ICU in stable condition.    Michaelle Birks, MD [facility]   Pager. [place]

## 2023-02-06 NOTE — Progress Notes (Signed)
Follow up on CT. SBO seems to have improved/resolved.  Now w/ question of hemorrhagic conversion of dominant RIGHT hepatic lobe mass, previously characterized as adenoma with small focus of enhancement medial to the mass is suspicious for active extravasation.  She has a hx of prior Left lateral hepatectomy, Right inferior hepatectomy, cholecystectomy in 2020 by [facility] at [place].  Patient is not on any prophylactic or therapeutic anticoagulation/antiplatelet medications.  Her hgb is 9.6 this am from 11.3 yesterday (received 2L IVF per chart review last night so question if a degree of this is at least dilutional). I have obtained repeat CBC and hgb overall stable at 9.3 this pm. Will continue to trend w/ every 6 hours cbc's. She is overall HDS w/ stable tachycardia ~110 throughout today (also w/ intermittent fevers that could be at least partially contributing to HR) and no hypotension (SBP has been > 130 all day). Continue to trend.  Discussed case with my attending.  I have reached out to IR for evaluation. Discussed case with Dr. Earleen Newport and PA Sherlie Ban. They will plan for procedure tonight. I have updated the patient. Keep NPO (NPO ordered since 808am today).  In the meantime, if there are any changes in hemodynamics, please reach out to the on call surgeon. I have called [facility] and update them on the plan.   Jillyn Ledger, [facility]

## 2023-02-07 ENCOUNTER — Inpatient Hospital Stay (HOSPITAL_COMMUNITY): Payer: Medicaid Other

## 2023-02-07 DIAGNOSIS — Z8719 Personal history of other diseases of the digestive system: Secondary | ICD-10-CM | POA: Diagnosis not present

## 2023-02-07 DIAGNOSIS — D134 Benign neoplasm of liver: Secondary | ICD-10-CM | POA: Diagnosis not present

## 2023-02-07 DIAGNOSIS — R748 Abnormal levels of other serum enzymes: Secondary | ICD-10-CM | POA: Diagnosis not present

## 2023-02-07 DIAGNOSIS — K56600 Partial intestinal obstruction, unspecified as to cause: Secondary | ICD-10-CM | POA: Diagnosis not present

## 2023-02-07 DIAGNOSIS — R509 Fever, unspecified: Secondary | ICD-10-CM | POA: Diagnosis not present

## 2023-02-07 DIAGNOSIS — Z9889 Other specified postprocedural states: Secondary | ICD-10-CM | POA: Diagnosis not present

## 2023-02-07 DIAGNOSIS — D259 Leiomyoma of uterus, unspecified: Secondary | ICD-10-CM | POA: Diagnosis not present

## 2023-02-07 DIAGNOSIS — K5669 Other partial intestinal obstruction: Secondary | ICD-10-CM | POA: Diagnosis not present

## 2023-02-07 DIAGNOSIS — K7689 Other specified diseases of liver: Secondary | ICD-10-CM | POA: Diagnosis present

## 2023-02-07 LAB — CBC
HCT: 27.7 % — ABNORMAL LOW (ref 36.0–46.0)
HCT: 29.3 % — ABNORMAL LOW (ref 36.0–46.0)
HCT: 28.6 % — ABNORMAL LOW (ref 36.0–46.0)
Hemoglobin: 8.8 g/dL — ABNORMAL LOW (ref 12.0–15.0)
Hemoglobin: 8.9 g/dL — ABNORMAL LOW (ref 12.0–15.0)
Hemoglobin: 9.2 g/dL — ABNORMAL LOW (ref 12.0–15.0)
MCH: 29.3 pg (ref 26.0–34.0)
MCH: 29.3 pg (ref 26.0–34.0)
MCH: 29 pg (ref 26.0–34.0)
MCHC: 31.8 g/dL (ref 30.0–36.0)
MCHC: 30.4 g/dL (ref 30.0–36.0)
MCHC: 32.2 g/dL (ref 30.0–36.0)
MCV: 92.3 fL (ref 80.0–100.0)
MCV: 96.4 fL (ref 80.0–100.0)
MCV: 90.2 fL (ref 80.0–100.0)
Platelets: 234 10*3/uL (ref 150–400)
Platelets: 269 10*3/uL (ref 150–400)
Platelets: 244 10*3/uL (ref 150–400)
RBC: 3 MIL/uL — ABNORMAL LOW (ref 3.87–5.11)
RBC: 3.04 MIL/uL — ABNORMAL LOW (ref 3.87–5.11)
RBC: 3.17 MIL/uL — ABNORMAL LOW (ref 3.87–5.11)
RDW: 12.3 % (ref 11.5–15.5)
RDW: 12.1 % (ref 11.5–15.5)
RDW: 12.2 % (ref 11.5–15.5)
WBC: 13.7 10*3/uL — ABNORMAL HIGH (ref 4.0–10.5)
WBC: 15.1 10*3/uL — ABNORMAL HIGH (ref 4.0–10.5)
WBC: 14.7 10*3/uL — ABNORMAL HIGH (ref 4.0–10.5)
nRBC: 0 % (ref 0.0–0.2)
nRBC: 0 % (ref 0.0–0.2)
nRBC: 0 % (ref 0.0–0.2)

## 2023-02-07 LAB — BASIC METABOLIC PANEL
Anion gap: 14 (ref 5–15)
BUN: 8 mg/dL (ref 6–20)
CO2: 22 mmol/L (ref 22–32)
Calcium: 7.9 mg/dL — ABNORMAL LOW (ref 8.9–10.3)
Chloride: 101 mmol/L (ref 98–111)
Creatinine, Ser: 0.51 mg/dL (ref 0.44–1.00)
GFR, Estimated: >60 mL/min (ref >60)
Glucose, Bld: 83 mg/dL (ref 70–99)
Potassium: 3 mmol/L — ABNORMAL LOW (ref 3.5–5.1)
Sodium: 137 mmol/L (ref 135–145)

## 2023-02-07 LAB — MAGNESIUM: Magnesium: 1.9 mg/dL (ref 1.7–2.4)

## 2023-02-07 LAB — MRSA NEXT GEN BY PCR, NASAL: MRSA by PCR Next Gen: DETECTED — AB

## 2023-02-07 LAB — PROCALCITONIN: Procalcitonin: 0.94 ng/mL

## 2023-02-07 MED ORDER — SODIUM CHLORIDE 0.9 % IV SOLN: INTRAVENOUS | Status: DC

## 2023-02-07 MED ORDER — MUPIROCIN 2 % EX OINT
1.0000 | TOPICAL_OINTMENT | Freq: Two times a day (BID) | CUTANEOUS | Status: DC
  Administered 2023-02-07 – 2023-02-09 (×6): 1 via NASAL
  Filled 2023-02-07: qty 22

## 2023-02-07 MED ORDER — DOCUSATE SODIUM 100 MG PO CAPS
100.0000 mg | ORAL_CAPSULE | Freq: Two times a day (BID) | ORAL | Status: DC
  Administered 2023-02-07 (×2): 100 mg via ORAL
  Filled 2023-02-07 (×3): qty 1

## 2023-02-07 MED ORDER — POTASSIUM CHLORIDE 20 MEQ PO PACK
20.0000 meq | PACK | Freq: Once | ORAL | Status: AC
  Administered 2023-02-07: 20 meq via ORAL
  Filled 2023-02-07: qty 1

## 2023-02-07 MED ORDER — POTASSIUM CHLORIDE 10 MEQ/100ML IV SOLN
10.0000 meq | INTRAVENOUS | Status: AC
  Administered 2023-02-07 (×2): 10 meq via INTRAVENOUS
  Filled 2023-02-07 (×2): qty 100

## 2023-02-07 MED ORDER — POTASSIUM CHLORIDE 10 MEQ/100ML IV SOLN
10.0000 meq | INTRAVENOUS | Status: AC
  Administered 2023-02-07 (×6): 10 meq via INTRAVENOUS
  Filled 2023-02-07 (×6): qty 100

## 2023-02-07 MED ORDER — SIMETHICONE 80 MG PO CHEW
80.0000 mg | CHEWABLE_TABLET | Freq: Four times a day (QID) | ORAL | Status: DC | PRN
  Administered 2023-02-07 – 2023-02-08 (×2): 80 mg via ORAL
  Filled 2023-02-07 (×2): qty 1

## 2023-02-07 MED ORDER — MAGNESIUM SULFATE IN D5W 1-5 GM/100ML-% IV SOLN
1.0000 g | Freq: Once | INTRAVENOUS | Status: AC
  Administered 2023-02-07: 1 g via INTRAVENOUS
  Filled 2023-02-07: qty 100

## 2023-02-07 NOTE — Progress Notes (Signed)
PROGRESS NOTE    Jodi Forbes  [id number] DOB: 08-13-1966 DOA: 02/02/2023 PCP: Patient, No Pcp Per    Brief Narrative:   Jodi Forbes is a 57 years old female with past medical history of hepatic adenoma status postsurgical resections in the past at [facility], presented to hospital with sharp abdominal pain with some nausea and vomiting.  Symptoms did not improve so she decided to come to the hospital.  In the ED, patient had generalized abdominal tenderness.  Initial blood pressure was elevated.  CBC showed a WBC of 10.9.  BMP showed creatinine at 0.6.  Lipase of 24.  CT scan of the abdomen and pelvis was done which showed air and fluid filled loops of small bowel in the central left hemiabdomen with appropriate transition with multiple large hypodense liver lesions largest one up to 12.8 x 9.9 cm.  General surgery was consulted and patient was admitted hospital for further evaluation and treatment.  Assessment and Plan:   Partial small bowel obstruction. General surgery on board.    Status post Gastrografin study with contrast in the colon..  Clinically improving.  Patient has been restarted back on clears after IR intervention yesterday.  Hepatic adenoma with  intratumoral hemorrhage.  Patient had CT scan of the chest abdomen and pelvis yesterday to see any source of the infection since she was having fever but was noted to have a intra tumoral hemorrhage in the hepatic adenoma.  Status post IR guided embolization for acute extravasation from large right hepatic lobe mass.  Will continue to monitor hemoglobin levels.  Hemoglobin today at 8.8.  Patient still complains of a 7/ 10 pain.  Seen by general surgery and has been started on clears.  History of hepatic adenomas Elevated LFT likely from hepatic adenomas.  Largest one at 12.8 x 9.9 cm.  Patient stated that she did have multiple hepatic adenoma resections in the past at [facility] in [place]..  Continue analgesia and focus on  pain relief.  Follow surgical recommendation.  INR elevated at 1.4.  New fever with leukocytosis.   Temperature max of 100.6 F today.  Leukocytosis with a WBC at 15.1 from 16.9 yesterday, initial WBC at 10.9.  Urinalysis negative.   Initial chest x-ray showed some patchy opacities over the right lower lung but a repeat chest x-ray negative for acute infiltrate.  Continue incentive spirometry, procalcitonin was 0.8.  Blood cultures negative in 1 day.  Patient has been empirically started on Vanco, cefepime and Flagyl for possible intra-abdominal source but CT scan of the chest abdomen pelvis without obvious source of infection except for hepatic adenoma with intratumoral hemorrhage.  MRSA PCR positive.  COVID influenza and RSV negative..   Lactate was 0.7.  Urinalysis with 6-10 white cells only.    Hypokalemia will replenish orally and through IV.  Check levels in AM.    DVT prophylaxis: SCDs Start: 02/02/23 1703    Code Status:     Code Status: Full Code  Disposition:  Likely home 1 to 2 days pending upon clinical improvement and surgical input.  Status is: Inpatient  Remains inpatient appropriate because: Resolving small bowel obstruction, IV fluids,   rising leukocytosis with fever, status post IR embolization, pending clinical improvement.   Family Communication:  Spoke with the patient's mother at bedside on 02/07/2023  Consultants:  General surgery Interventional radiology  Procedures:  NG tube placement and removal Interventional radiology with the celiac and right hepatic arteriography with embolization on 02/06/2023.  Antimicrobials:  Vancomycin,  cefepime and Flagyl.  Anti-infectives (From admission, onward)    Start     Dose/Rate Route Frequency Ordered Stop   02/07/23 0200  vancomycin (VANCOREADY) IVPB 1250 mg/250 mL        1,250 mg 166.7 mL/hr over 90 Minutes Intravenous Every 24 hours 02/06/23 0334     02/06/23 1928  piperacillin-tazobactam (ZOSYN) IVPB        over  240 Minutes  Continuous PRN 02/06/23 1932 02/06/23 1928   02/06/23 0800  ceFEPIme (MAXIPIME) 2 g in sodium chloride 0.9 % 100 mL IVPB        2 g 200 mL/hr over 30 Minutes Intravenous Every 8 hours 02/06/23 0103     02/06/23 0100  ceFEPIme (MAXIPIME) 2 g in sodium chloride 0.9 % 100 mL IVPB        2 g 200 mL/hr over 30 Minutes Intravenous  Once 02/06/23 0006 02/06/23 0051   02/06/23 0100  metroNIDAZOLE (FLAGYL) IVPB 500 mg        500 mg 100 mL/hr over 60 Minutes Intravenous Every 12 hours 02/06/23 0006 02/13/23 0059   02/06/23 0100  vancomycin (VANCOCIN) IVPB 1000 mg/200 mL premix  Status:  Discontinued        1,000 mg 200 mL/hr over 60 Minutes Intravenous  Once 02/06/23 0006 02/06/23 0011   02/06/23 0100  vancomycin (VANCOREADY) IVPB 1750 mg/350 mL        1,750 mg 175 mL/hr over 120 Minutes Intravenous  Once 02/06/23 0011 02/06/23 0443      Subjective:  Today, patient was seen and examined at bedside.  Still complains of 7/10 pain in the epigastric region.  Has not had gas or bowel movement today but feels like she might need to pass flatus.  Objective: Vitals:   02/07/23 0600 02/07/23 0759 02/07/23 0800 02/07/23 0900  BP: (!) 144/72  (!) 146/76 (!) 142/70  Pulse:      Resp: 20  14 19  $ Temp:  100 F (37.8 C)    TempSrc:  Oral    SpO2:   97%   Weight:      Height:        Intake/Output Summary (Last 24 hours) at 02/07/2023 1052 Last data filed at 02/07/2023 [id number] Gross per 24 hour  Intake 831.36 ml  Output 750 ml  Net 81.36 ml    Filed Weights   02/02/23 1253 02/06/23 2019  Weight: 86.2 kg 83.1 kg    Physical Examination: Body mass index is 31.45 kg/m.   General: Obese built, alert awake and Communicative, not in obvious distress, HENT:   No scleral pallor or icterus noted. Oral mucosa is moist.  Chest:  Clear breath sounds.  Diminished breath sounds bilaterally. No crackles or wheezes.  CVS: S1 &S2 heard. No murmur.  Regular rate and rhythm. Abdomen: Tenderness  over the epigastric region, mild distention, no rebound tenderness, extremities: No cyanosis, clubbing or edema.  Peripheral pulses are palpable. Psych: Alert, awake and oriented, normal mood CNS:  No cranial nerve deficits.  Power equal in all extremities.   Skin: Warm and dry.  No rashes noted.  Data Reviewed:   CBC: Recent Labs  Lab 02/06/23 0106 02/06/23 1615 02/06/23 2204 02/07/23 0505 02/07/23 0942  WBC 16.9* 15.6* 13.9* 13.7* 15.1*  NEUTROABS 13.0*  --   --   --   --   HGB 9.6* 9.3* 9.2* 8.8* 8.9*  HCT 30.7* 29.6* 29.4* 27.7* 29.3*  MCV 94.2 92.8 93.6 92.3 96.4  PLT 270  260 251 234 269     Basic Metabolic Panel: Recent Labs  Lab 02/03/23 0419 02/04/23 0416 02/05/23 0451 02/06/23 0106 02/06/23 0118 02/07/23 0505  NA 137 139 141 136  --  137  K 3.6 3.6 3.2* 3.3*  --  3.0*  CL 103 104 107 105  --  101  CO2 23 25 24 22  $ --  22  GLUCOSE 118* 104* 114* 102*  --  83  BUN 16 22* 21* 12  --  8  CREATININE 0.58 0.73 0.57 0.55  --  0.51  CALCIUM 9.1 8.8* 8.5* 8.0*  --  7.9*  MG  --  2.0 2.3  --  1.7 1.9     Liver Function Tests: Recent Labs  Lab 02/02/23 1254 02/03/23 0419 02/06/23 0106  AST 21 44* 119*  ALT 19 35 281*  ALKPHOS 263* 230* 231*  BILITOT 0.9 1.0 1.4*  PROT 7.6 7.4 6.3*  ALBUMIN 3.6 3.5 2.6*      Radiology Studies: DG Abd 1 View  Result Date: 02/07/2023 CLINICAL DATA:  Ileus.  Liver embolization yesterday. EXAM: ABDOMEN - 1 VIEW COMPARISON:  CT abdomen 02/06/2023 FINDINGS: Contrast medium noted in the colon. No dilated small bowel identified, a left abdominal small bowel loop measures 2.6 cm in diameter. Calcified uterine fibroid noted. Stable high density material lining the inferior margin of the right hepatic lobe likely residual from prior resections. Contrast medium in the urinary bladder. IMPRESSION: 1. No dilated small bowel identified. Contrast medium in the colon. 2. Calcified uterine fibroid. 3. Contrast medium in the urinary bladder.  Electronically Signed   By: Van Clines M.D.   On: 02/07/2023 10:05   IR US Guide Vasc Access Right  Result Date: 02/06/2023 INDICATION: Hemorrhagic liver mass Briefly, 57 year old female with history of multifocal hepatic adenomas with previous resections presenting with hemorrhagic dominant RIGHT hepatic lobe tumor. EXAM: Procedures: 1. CELIAC and HEPATIC ARTERIOGRAPHY 2. GEL FOAM EMBOLIZATION of HEMORRHAGIC RIGHT HEPATIC LOBE MASS COMPARISON:  CT CAP, earlier same day. MEDICATIONS: 3.375 Zosyn IV. The antibiotic was administered within 1 hour of the procedure. 4 mg Zofran IV. ANESTHESIA/SEDATION: Moderate (conscious) sedation was employed during this procedure. A total of Versed 2 mg and Fentanyl 100 mcg was administered intravenously. Moderate Sedation Time: 71 minutes. The patient's level of consciousness and vital signs were monitored continuously by radiology nursing throughout the procedure under my direct supervision. CONTRAST:  50 mL Omnipaque 300 FLUOROSCOPY TIME:  Fluoroscopic dose; [id number] mGy COMPLICATIONS: None immediate. PROCEDURE: Informed consent was obtained from the patient and/or patient's representative following explanation of the procedure, risks, benefits and alternatives. All questions were addressed. A time out was performed prior to the initiation of the procedure. Maximal barrier sterile technique utilized including caps, mask, sterile gowns, sterile gloves, large sterile drape, hand hygiene, and chlorhexidine prep. At the RIGHT groin 1% lidocaine was used for local anesthesia. Limited ultrasound imaging of the groin shows the RIGHT femoral artery to be patent. An ultrasound image was saved and sent to PACS. Arterial access was obtained with a 21-G, 7-cm needle under direct ultrasound guidance, through which a 0.018-inch guidewire was advanced under fluoroscopy. The 21-G needle was then exchanged for a micropuncture catheter and a 0.035-inch Bentson guidewire. The micropuncture  catheter was exchanged for a 5 Fr sheath. Over the wire, a 5 Fr Cobra C2 catheter was positioned within the proximal abdominal aorta and used to select the celiac axis. Once catheterized, a digital subtraction angiogram (DSA) was obtained.  A 2.8 Fr 110 cm Progreat microcatheter and 0.016 inch Fathom microwire was used to gain access to the common then proper then LEFT and RIGHT arteries. Selective DSAs were performed at each level. Finally the target vessel branching off the RIGHT hepatic artery was isolated and a subselective DSA was performed. PURPOSE OF THE ARTERIOGRAM: No previous catheter-directed angiogram was available. Therefore a new complete diagnostic angiography was performed. The decision to proceed with an interventional procedure was made based on this new diagnostic angiogram. Selective branch RIGHT hepatic artery embolization was then performed using Gelfoam slurry. A final angiogram was performed. Images were reviewed and the procedure was terminated. All wires, catheters and sheaths were removed from the patient. Hemostasis was achieved at the RIGHT groin access site with Angio-Seal closure. The patient tolerated the procedure well without immediate post procedural complication. FINDINGS: *Hepatic arteriography with active extravasation at the large RIGHT hepatic lobe mass, originating off a cephalad-projecting branch. *Successful selective Gelfoam slurry embolization of a branch RIGHT hepatic artery, as above. IMPRESSION: Active extravasation from large RIGHT hepatic lobe mass, successfully treated with selective catheter-directed Gelfoam embolization. PLAN: *Post sheath removal precautions including RIGHT lower extremity slight straight x2 hours. *Serial abdominal examinations and H/h trend to evaluate potential ongoing hepatic bleeding. Michaelle Birks, MD [facility] Electronically Signed   By: Michaelle Birks M.D.   On: 02/06/2023 20:57    IR EMBO ART  VEN HEMORR LYMPH EXTRAV  INC GUIDE ROADMAPPING  Result Date: 02/06/2023 INDICATION: Hemorrhagic liver mass Briefly, 57 year old female with history of multifocal hepatic adenomas with previous resections presenting with hemorrhagic dominant RIGHT hepatic lobe tumor. EXAM: Procedures: 1. CELIAC and HEPATIC ARTERIOGRAPHY 2. GEL FOAM EMBOLIZATION of HEMORRHAGIC RIGHT HEPATIC LOBE MASS COMPARISON:  CT CAP, earlier same day. MEDICATIONS: 3.375 Zosyn IV. The antibiotic was administered within 1 hour of the procedure. 4 mg Zofran IV. ANESTHESIA/SEDATION: Moderate (conscious) sedation was employed during this procedure. A total of Versed 2 mg and Fentanyl 100 mcg was administered intravenously. Moderate Sedation Time: 71 minutes. The patient's level of consciousness and vital signs were monitored continuously by radiology nursing throughout the procedure under my direct supervision. CONTRAST:  50 mL Omnipaque 300 FLUOROSCOPY TIME:  Fluoroscopic dose; [id number] mGy COMPLICATIONS: None immediate. PROCEDURE: Informed consent was obtained from the patient and/or patient's representative following explanation of the procedure, risks, benefits and alternatives. All questions were addressed. A time out was performed prior to the initiation of the procedure. Maximal barrier sterile technique utilized including caps, mask, sterile gowns, sterile gloves, large sterile drape, hand hygiene, and chlorhexidine prep. At the RIGHT groin 1% lidocaine was used for local anesthesia. Limited ultrasound imaging of the groin shows the RIGHT femoral artery to be patent. An ultrasound image was saved and sent to PACS. Arterial access was obtained with a 21-G, 7-cm needle under direct ultrasound guidance, through which a 0.018-inch guidewire was advanced under fluoroscopy. The 21-G needle was then exchanged for a micropuncture catheter and a 0.035-inch Bentson guidewire. The micropuncture catheter was exchanged for a 5 Fr sheath. Over  the wire, a 5 Fr Cobra C2 catheter was positioned within the proximal abdominal aorta and used to select the celiac axis. Once catheterized, a digital subtraction angiogram (DSA) was obtained. A 2.8 Fr 110 cm Progreat microcatheter and 0.016 inch Fathom microwire was used to gain access to the common then proper then LEFT and RIGHT arteries. Selective DSAs were performed at each level. Finally the target vessel branching off the  RIGHT hepatic artery was isolated and a subselective DSA was performed. PURPOSE OF THE ARTERIOGRAM: No previous catheter-directed angiogram was available. Therefore a new complete diagnostic angiography was performed. The decision to proceed with an interventional procedure was made based on this new diagnostic angiogram. Selective branch RIGHT hepatic artery embolization was then performed using Gelfoam slurry. A final angiogram was performed. Images were reviewed and the procedure was terminated. All wires, catheters and sheaths were removed from the patient. Hemostasis was achieved at the RIGHT groin access site with Angio-Seal closure. The patient tolerated the procedure well without immediate post procedural complication. FINDINGS: *Hepatic arteriography with active extravasation at the large RIGHT hepatic lobe mass, originating off a cephalad-projecting branch. *Successful selective Gelfoam slurry embolization of a branch RIGHT hepatic artery, as above. IMPRESSION: Active extravasation from large RIGHT hepatic lobe mass, successfully treated with selective catheter-directed Gelfoam embolization. PLAN: *Post sheath removal precautions including RIGHT lower extremity slight straight x2 hours. *Serial abdominal examinations and H/h trend to evaluate potential ongoing hepatic bleeding. Michaelle Birks, MD [facility] Radiology Electronically Signed   By: Michaelle Birks M.D.   On: 02/06/2023 20:57   IR Angiogram Visceral Selective  Result Date:  02/06/2023 INDICATION: Hemorrhagic liver mass Briefly, 57 year old female with history of multifocal hepatic adenomas with previous resections presenting with hemorrhagic dominant RIGHT hepatic lobe tumor. EXAM: Procedures: 1. CELIAC and HEPATIC ARTERIOGRAPHY 2. GEL FOAM EMBOLIZATION of HEMORRHAGIC RIGHT HEPATIC LOBE MASS COMPARISON:  CT CAP, earlier same day. MEDICATIONS: 3.375 Zosyn IV. The antibiotic was administered within 1 hour of the procedure. 4 mg Zofran IV. ANESTHESIA/SEDATION: Moderate (conscious) sedation was employed during this procedure. A total of Versed 2 mg and Fentanyl 100 mcg was administered intravenously. Moderate Sedation Time: 71 minutes. The patient's level of consciousness and vital signs were monitored continuously by radiology nursing throughout the procedure under my direct supervision. CONTRAST:  50 mL Omnipaque 300 FLUOROSCOPY TIME:  Fluoroscopic dose; [id number] mGy COMPLICATIONS: None immediate. PROCEDURE: Informed consent was obtained from the patient and/or patient's representative following explanation of the procedure, risks, benefits and alternatives. All questions were addressed. A time out was performed prior to the initiation of the procedure. Maximal barrier sterile technique utilized including caps, mask, sterile gowns, sterile gloves, large sterile drape, hand hygiene, and chlorhexidine prep. At the RIGHT groin 1% lidocaine was used for local anesthesia. Limited ultrasound imaging of the groin shows the RIGHT femoral artery to be patent. An ultrasound image was saved and sent to PACS. Arterial access was obtained with a 21-G, 7-cm needle under direct ultrasound guidance, through which a 0.018-inch guidewire was advanced under fluoroscopy. The 21-G needle was then exchanged for a micropuncture catheter and a 0.035-inch Bentson guidewire. The micropuncture catheter was exchanged for a 5 Fr sheath. Over the wire, a 5 Fr Cobra C2 catheter was positioned within the proximal abdominal  aorta and used to select the celiac axis. Once catheterized, a digital subtraction angiogram (DSA) was obtained. A 2.8 Fr 110 cm Progreat microcatheter and 0.016 inch Fathom microwire was used to gain access to the common then proper then LEFT and RIGHT arteries. Selective DSAs were performed at each level. Finally the target vessel branching off the RIGHT hepatic artery was isolated and a subselective DSA was performed. PURPOSE OF THE ARTERIOGRAM: No previous catheter-directed angiogram was available. Therefore a new complete diagnostic angiography was performed. The decision to proceed with an interventional procedure was made based on this new diagnostic angiogram. Selective branch RIGHT hepatic  artery embolization was then performed using Gelfoam slurry. A final angiogram was performed. Images were reviewed and the procedure was terminated. All wires, catheters and sheaths were removed from the patient. Hemostasis was achieved at the RIGHT groin access site with Angio-Seal closure. The patient tolerated the procedure well without immediate post procedural complication. FINDINGS: *Hepatic arteriography with active extravasation at the large RIGHT hepatic lobe mass, originating off a cephalad-projecting branch. *Successful selective Gelfoam slurry embolization of a branch RIGHT hepatic artery, as above. IMPRESSION: Active extravasation from large RIGHT hepatic lobe mass, successfully treated with selective catheter-directed Gelfoam embolization. PLAN: *Post sheath removal precautions including RIGHT lower extremity slight straight x2 hours. *Serial abdominal examinations and H/h trend to evaluate potential ongoing hepatic bleeding. Michaelle Birks, MD [facility] Radiology Electronically Signed   By: Michaelle Birks M.D.   On: 02/06/2023 20:57   IR Angiogram Selective Each Additional Vessel  Result Date: 02/06/2023 INDICATION: Hemorrhagic liver mass Briefly, 57 year old  female with history of multifocal hepatic adenomas with previous resections presenting with hemorrhagic dominant RIGHT hepatic lobe tumor. EXAM: Procedures: 1. CELIAC and HEPATIC ARTERIOGRAPHY 2. GEL FOAM EMBOLIZATION of HEMORRHAGIC RIGHT HEPATIC LOBE MASS COMPARISON:  CT CAP, earlier same day. MEDICATIONS: 3.375 Zosyn IV. The antibiotic was administered within 1 hour of the procedure. 4 mg Zofran IV. ANESTHESIA/SEDATION: Moderate (conscious) sedation was employed during this procedure. A total of Versed 2 mg and Fentanyl 100 mcg was administered intravenously. Moderate Sedation Time: 71 minutes. The patient's level of consciousness and vital signs were monitored continuously by radiology nursing throughout the procedure under my direct supervision. CONTRAST:  50 mL Omnipaque 300 FLUOROSCOPY TIME:  Fluoroscopic dose; [id number] mGy COMPLICATIONS: None immediate. PROCEDURE: Informed consent was obtained from the patient and/or patient's representative following explanation of the procedure, risks, benefits and alternatives. All questions were addressed. A time out was performed prior to the initiation of the procedure. Maximal barrier sterile technique utilized including caps, mask, sterile gowns, sterile gloves, large sterile drape, hand hygiene, and chlorhexidine prep. At the RIGHT groin 1% lidocaine was used for local anesthesia. Limited ultrasound imaging of the groin shows the RIGHT femoral artery to be patent. An ultrasound image was saved and sent to PACS. Arterial access was obtained with a 21-G, 7-cm needle under direct ultrasound guidance, through which a 0.018-inch guidewire was advanced under fluoroscopy. The 21-G needle was then exchanged for a micropuncture catheter and a 0.035-inch Bentson guidewire. The micropuncture catheter was exchanged for a 5 Fr sheath. Over the wire, a 5 Fr Cobra C2 catheter was positioned within the proximal abdominal aorta and used to select the celiac axis. Once catheterized, a  digital subtraction angiogram (DSA) was obtained. A 2.8 Fr 110 cm Progreat microcatheter and 0.016 inch Fathom microwire was used to gain access to the common then proper then LEFT and RIGHT arteries. Selective DSAs were performed at each level. Finally the target vessel branching off the RIGHT hepatic artery was isolated and a subselective DSA was performed. PURPOSE OF THE ARTERIOGRAM: No previous catheter-directed angiogram was available. Therefore a new complete diagnostic angiography was performed. The decision to proceed with an interventional procedure was made based on this new diagnostic angiogram. Selective branch RIGHT hepatic artery embolization was then performed using Gelfoam slurry. A final angiogram was performed. Images were reviewed and the procedure was terminated. All wires, catheters and sheaths were removed from the patient. Hemostasis was achieved at the RIGHT groin access site with Angio-Seal closure. The patient tolerated the  procedure well without immediate post procedural complication. FINDINGS: *Hepatic arteriography with active extravasation at the large RIGHT hepatic lobe mass, originating off a cephalad-projecting branch. *Successful selective Gelfoam slurry embolization of a branch RIGHT hepatic artery, as above. IMPRESSION: Active extravasation from large RIGHT hepatic lobe mass, successfully treated with selective catheter-directed Gelfoam embolization. PLAN: *Post sheath removal precautions including RIGHT lower extremity slight straight x2 hours. *Serial abdominal examinations and H/h trend to evaluate potential ongoing hepatic bleeding. Michaelle Birks, MD [facility] Electronically Signed   By: Michaelle Birks M.D.   On: 02/06/2023 20:57   IR Angiogram Selective Each Additional Vessel  Result Date: 02/06/2023 INDICATION: Hemorrhagic liver mass Briefly, 57 year old female with history of multifocal hepatic adenomas with  previous resections presenting with hemorrhagic dominant RIGHT hepatic lobe tumor. EXAM: Procedures: 1. CELIAC and HEPATIC ARTERIOGRAPHY 2. GEL FOAM EMBOLIZATION of HEMORRHAGIC RIGHT HEPATIC LOBE MASS COMPARISON:  CT CAP, earlier same day. MEDICATIONS: 3.375 Zosyn IV. The antibiotic was administered within 1 hour of the procedure. 4 mg Zofran IV. ANESTHESIA/SEDATION: Moderate (conscious) sedation was employed during this procedure. A total of Versed 2 mg and Fentanyl 100 mcg was administered intravenously. Moderate Sedation Time: 71 minutes. The patient's level of consciousness and vital signs were monitored continuously by radiology nursing throughout the procedure under my direct supervision. CONTRAST:  50 mL Omnipaque 300 FLUOROSCOPY TIME:  Fluoroscopic dose; [id number] mGy COMPLICATIONS: None immediate. PROCEDURE: Informed consent was obtained from the patient and/or patient's representative following explanation of the procedure, risks, benefits and alternatives. All questions were addressed. A time out was performed prior to the initiation of the procedure. Maximal barrier sterile technique utilized including caps, mask, sterile gowns, sterile gloves, large sterile drape, hand hygiene, and chlorhexidine prep. At the RIGHT groin 1% lidocaine was used for local anesthesia. Limited ultrasound imaging of the groin shows the RIGHT femoral artery to be patent. An ultrasound image was saved and sent to PACS. Arterial access was obtained with a 21-G, 7-cm needle under direct ultrasound guidance, through which a 0.018-inch guidewire was advanced under fluoroscopy. The 21-G needle was then exchanged for a micropuncture catheter and a 0.035-inch Bentson guidewire. The micropuncture catheter was exchanged for a 5 Fr sheath. Over the wire, a 5 Fr Cobra C2 catheter was positioned within the proximal abdominal aorta and used to select the celiac axis. Once catheterized, a digital subtraction angiogram (DSA) was obtained. A 2.8 Fr  110 cm Progreat microcatheter and 0.016 inch Fathom microwire was used to gain access to the common then proper then LEFT and RIGHT arteries. Selective DSAs were performed at each level. Finally the target vessel branching off the RIGHT hepatic artery was isolated and a subselective DSA was performed. PURPOSE OF THE ARTERIOGRAM: No previous catheter-directed angiogram was available. Therefore a new complete diagnostic angiography was performed. The decision to proceed with an interventional procedure was made based on this new diagnostic angiogram. Selective branch RIGHT hepatic artery embolization was then performed using Gelfoam slurry. A final angiogram was performed. Images were reviewed and the procedure was terminated. All wires, catheters and sheaths were removed from the patient. Hemostasis was achieved at the RIGHT groin access site with Angio-Seal closure. The patient tolerated the procedure well without immediate post procedural complication. FINDINGS: *Hepatic arteriography with active extravasation at the large RIGHT hepatic lobe mass, originating off a cephalad-projecting branch. *Successful selective Gelfoam slurry embolization of a branch RIGHT hepatic artery, as above. IMPRESSION: Active extravasation from large RIGHT hepatic lobe mass,  successfully treated with selective catheter-directed Gelfoam embolization. PLAN: *Post sheath removal precautions including RIGHT lower extremity slight straight x2 hours. *Serial abdominal examinations and H/h trend to evaluate potential ongoing hepatic bleeding. Michaelle Birks, MD [facility] Electronically Signed   By: Michaelle Birks M.D.   On: 02/06/2023 20:57   IR Angiogram Selective Each Additional Vessel  Result Date: 02/06/2023 INDICATION: Hemorrhagic liver mass Briefly, 57 year old female with history of multifocal hepatic adenomas with previous resections presenting with hemorrhagic dominant RIGHT  hepatic lobe tumor. EXAM: Procedures: 1. CELIAC and HEPATIC ARTERIOGRAPHY 2. GEL FOAM EMBOLIZATION of HEMORRHAGIC RIGHT HEPATIC LOBE MASS COMPARISON:  CT CAP, earlier same day. MEDICATIONS: 3.375 Zosyn IV. The antibiotic was administered within 1 hour of the procedure. 4 mg Zofran IV. ANESTHESIA/SEDATION: Moderate (conscious) sedation was employed during this procedure. A total of Versed 2 mg and Fentanyl 100 mcg was administered intravenously. Moderate Sedation Time: 71 minutes. The patient's level of consciousness and vital signs were monitored continuously by radiology nursing throughout the procedure under my direct supervision. CONTRAST:  50 mL Omnipaque 300 FLUOROSCOPY TIME:  Fluoroscopic dose; [id number] mGy COMPLICATIONS: None immediate. PROCEDURE: Informed consent was obtained from the patient and/or patient's representative following explanation of the procedure, risks, benefits and alternatives. All questions were addressed. A time out was performed prior to the initiation of the procedure. Maximal barrier sterile technique utilized including caps, mask, sterile gowns, sterile gloves, large sterile drape, hand hygiene, and chlorhexidine prep. At the RIGHT groin 1% lidocaine was used for local anesthesia. Limited ultrasound imaging of the groin shows the RIGHT femoral artery to be patent. An ultrasound image was saved and sent to PACS. Arterial access was obtained with a 21-G, 7-cm needle under direct ultrasound guidance, through which a 0.018-inch guidewire was advanced under fluoroscopy. The 21-G needle was then exchanged for a micropuncture catheter and a 0.035-inch Bentson guidewire. The micropuncture catheter was exchanged for a 5 Fr sheath. Over the wire, a 5 Fr Cobra C2 catheter was positioned within the proximal abdominal aorta and used to select the celiac axis. Once catheterized, a digital subtraction angiogram (DSA) was obtained. A 2.8 Fr 110 cm Progreat microcatheter and 0.016 inch Fathom microwire  was used to gain access to the common then proper then LEFT and RIGHT arteries. Selective DSAs were performed at each level. Finally the target vessel branching off the RIGHT hepatic artery was isolated and a subselective DSA was performed. PURPOSE OF THE ARTERIOGRAM: No previous catheter-directed angiogram was available. Therefore a new complete diagnostic angiography was performed. The decision to proceed with an interventional procedure was made based on this new diagnostic angiogram. Selective branch RIGHT hepatic artery embolization was then performed using Gelfoam slurry. A final angiogram was performed. Images were reviewed and the procedure was terminated. All wires, catheters and sheaths were removed from the patient. Hemostasis was achieved at the RIGHT groin access site with Angio-Seal closure. The patient tolerated the procedure well without immediate post procedural complication. FINDINGS: *Hepatic arteriography with active extravasation at the large RIGHT hepatic lobe mass, originating off a cephalad-projecting branch. *Successful selective Gelfoam slurry embolization of a branch RIGHT hepatic artery, as above. IMPRESSION: Active extravasation from large RIGHT hepatic lobe mass, successfully treated with selective catheter-directed Gelfoam embolization. PLAN: *Post sheath removal precautions including RIGHT lower extremity slight straight x2 hours. *Serial abdominal examinations and H/h trend to evaluate potential ongoing hepatic bleeding. Michaelle Birks, MD [facility] Electronically Signed   By:  Michaelle Birks M.D.   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 EXAM: CT CHEST, ABDOMEN, AND PELVIS WITH CONTRAST TECHNIQUE: Multidetector CT imaging of the chest, abdomen and pelvis was  performed following the standard protocol during bolus administration of intravenous contrast. RADIATION DOSE REDUCTION: This exam was performed according to the departmental dose-optimization program which includes automated exposure control, adjustment of the mA and/or kV according to patient size and/or use of iterative reconstruction technique. CONTRAST:  133m OMNIPAQUE IOHEXOL 300 MG/ML  SOLN COMPARISON:  CT AP, 02/02/2023 and 03/26/2021. Chest XR, concurrent. FINDINGS: CT CHEST FINDINGS Cardiovascular: No acute intrathoracic vascular abnormality. Normal heart size. No pericardial effusion. Mediastinum/Nodes: No enlarged mediastinal, hilar, or axillary lymph nodes. Thyroid gland, trachea, and esophagus demonstrate no significant findings. Lungs/Pleura: Bilateral 8 mm subsolid pulmonary nodules, within the LEFT upper and RIGHT lower lobes. Additional 5 mm nodularity along the RIGHT major fissure is likely to represent an intrapulmonary lymph node. Lungs are otherwise clear without focal consolidation or mass. No pleural effusion or pneumothorax. Musculoskeletal: No acute chest wall mass or suspicious bone lesion. CT ABDOMEN PELVIS FINDINGS Hepatobiliary: *Dominant RIGHT superior hepatic lobe mass appears heterogeneously dense with new prominent hypodense component. *The mass measures approximately 8.0 x 10.2 x 10.0 cm (AP by transaxial by CC), previously 9.9 x 12.8 (AP by transaxial). *Question enhancing focus medial to the mass, suspicious for angiographic spot sign and active extravasation. *Postsurgical changes of prior RIGHT and LEFT liver mass resections. Status post cholecystectomy. No biliary dilatation. Pancreas: No pancreatic ductal dilatation. Spleen: Normal in size without focal abnormality. Adrenals/Urinary Tract: Adrenal glands are unremarkable. Kidneys are normal, without renal calculi, focal lesion, or hydronephrosis. Bladder is unremarkable. Stomach/Bowel: Stomach is within normal limits.  Appendix appears normal. Nonobstructed small bowel. Nondilated colon. Contrast opacification of distal small bowel and colon without extraluminal extravasation. No evidence of bowel wall thickening, distention, or inflammatory changes. Vascular/Lymphatic: No significant vascular findings are present. No enlarged abdominal or pelvic lymph nodes. Reproductive: Peripherally-calcified RIGHT fibroid. Prominent ovarian follicles. No adnexal mass. Other: Trace perirenal, peripancreatic and anterior pararenal space fat stranding. Trace body wall edema. No abdominal wall hernia or abnormality. No abdominopelvic ascites. Musculoskeletal: No acute or significant osseous findings. IMPRESSION: 1. Question of hemorrhagic conversion of dominant RIGHT hepatic lobe mass, previously characterized as adenoma. Small focus of enhancement medial to the mass is suspicious for active extravasation. See key image. 2. Trace LEFT perirenal and anterior pararenal space fat stranding is nonspecific. Early pyelonephritis can appear similar. Correlate with urinalysis. 3. Bilateral 8 mm part-solid pulmonary nodules. Per Fleischner Society Guidelines, recommend a non-contrast Chest CT at 3-6 months. These guidelines do not apply to immunocompromised patients and patients with cancer. Reference: Radiology. 2017; 284(1):228-43. These results were called by telephone at the time of interpretation on 02/06/2023 at 3:46 pm [facility] who verbally acknowledged these results. Electronically Signed   By: JMichaelle BirksM.D.   On: 02/06/2023 15:51   DG Chest 1 View  Result Date: 02/06/2023 CLINICAL DATA:  Sepsis EXAM: CHEST  1 VIEW COMPARISON:  01/06/2019 FINDINGS: Heart and mediastinal contours are within normal limits. No focal opacities or effusions. No acute bony abnormality. IMPRESSION: No active disease. Electronically Signed   By: KRolm BaptiseM.D.   On: 02/06/2023 00:37      LOS: 5 days  Flora Lipps, MD [facility] Available via Epic secure chat 7am-7pm After these hours, please refer to coverage provider listed on [email or web address] 02/07/2023, 10:52 AM

## 2023-02-07 NOTE — Progress Notes (Signed)
Progress Note     Subjective: Pt reports some generalized discomfort this AM. Denies n/v. Feels like she needs to pass flatus but is not since procedure. Would like something to drink.   Objective: Vital signs in last 24 hours: Temp:  [99.4 F (37.4 C)-100.6 F (38.1 C)] 99.4 F (37.4 C) (02/09 0400) Pulse Rate:  [86-110] 92 (02/08 2019) Resp:  [12-28] 14 (02/09 0800) BP: (122-153)/(65-83) 146/76 (02/09 0800) SpO2:  [93 %-100 %] 97 % (02/09 0800) Weight:  [83.1 kg] 83.1 kg (02/08 2019) Last BM Date : 02/05/23  Intake/Output from previous day: 02/08 0701 - 02/09 0700 In: 62 [IV Piggyback:50] Out: -  Intake/Output this shift: Total I/O In: 781.4 [IV Piggyback:781.4] Out: 750 [Urine:750]  PE: Gen:  Alert, NAD, pleasant Card:  Tachycardic with regular rhythm Pulm:  CTAB, no W/R/R, effort normal Abd: mild distension, soft. +BS, mild ttp in LLQ without peritonitis  Psych: A&Ox3    Lab Results:  Recent Labs    02/06/23 2204 02/07/23 0505  WBC 13.9* 13.7*  HGB 9.2* 8.8*  HCT 29.4* 27.7*  PLT 251 234   BMET Recent Labs    02/06/23 0106 02/07/23 0505  NA 136 137  K 3.3* 3.0*  CL 105 101  CO2 22 22  GLUCOSE 102* 83  BUN 12 8  CREATININE 0.55 0.51  CALCIUM 8.0* 7.9*   PT/INR Recent Labs    02/06/23 0106  LABPROT 16.8*  INR 1.4*   CMP     Component Value Date/Time   NA 137 02/07/2023 0505   K 3.0 (L) 02/07/2023 0505   CL 101 02/07/2023 0505   CO2 22 02/07/2023 0505   GLUCOSE 83 02/07/2023 0505   BUN 8 02/07/2023 0505   CREATININE 0.51 02/07/2023 0505   CALCIUM 7.9 (L) 02/07/2023 0505   PROT 6.3 (L) 02/06/2023 0106   ALBUMIN 2.6 (L) 02/06/2023 0106   AST 119 (H) 02/06/2023 0106   ALT 281 (H) 02/06/2023 0106   ALKPHOS 231 (H) 02/06/2023 0106   BILITOT 1.4 (H) 02/06/2023 0106   GFRNONAA >60 02/07/2023 0505   GFRAA >60 01/08/2019 0609   Lipase     Component Value Date/Time   LIPASE 24 02/02/2023 1254       Studies/Results: IR US  Guide Vasc Access Right  Result Date: 02/06/2023 INDICATION: Hemorrhagic liver mass Briefly, 57 year old female with history of multifocal hepatic adenomas with previous resections presenting with hemorrhagic dominant RIGHT hepatic lobe tumor. EXAM: Procedures: 1. CELIAC and HEPATIC ARTERIOGRAPHY 2. GEL FOAM EMBOLIZATION of HEMORRHAGIC RIGHT HEPATIC LOBE MASS COMPARISON:  CT CAP, earlier same day. MEDICATIONS: 3.375 Zosyn IV. The antibiotic was administered within 1 hour of the procedure. 4 mg Zofran IV. ANESTHESIA/SEDATION: Moderate (conscious) sedation was employed during this procedure. A total of Versed 2 mg and Fentanyl 100 mcg was administered intravenously. Moderate Sedation Time: 71 minutes. The patient's level of consciousness and vital signs were monitored continuously by radiology nursing throughout the procedure under my direct supervision. CONTRAST:  50 mL Omnipaque 300 FLUOROSCOPY TIME:  Fluoroscopic dose; [id number] mGy COMPLICATIONS: None immediate. PROCEDURE: Informed consent was obtained from the patient and/or patient's representative following explanation of the procedure, risks, benefits and alternatives. All questions were addressed. A time out was performed prior to the initiation of the procedure. Maximal barrier sterile technique utilized including caps, mask, sterile gowns, sterile gloves, large sterile drape, hand hygiene, and chlorhexidine prep. At the RIGHT groin 1% lidocaine was used for local anesthesia. Limited ultrasound imaging  of the groin shows the RIGHT femoral artery to be patent. An ultrasound image was saved and sent to PACS. Arterial access was obtained with a 21-G, 7-cm needle under direct ultrasound guidance, through which a 0.018-inch guidewire was advanced under fluoroscopy. The 21-G needle was then exchanged for a micropuncture catheter and a 0.035-inch Bentson guidewire. The micropuncture catheter was exchanged for a 5 Fr sheath. Over the wire, a 5 Fr Cobra C2 catheter was  positioned within the proximal abdominal aorta and used to select the celiac axis. Once catheterized, a digital subtraction angiogram (DSA) was obtained. A 2.8 Fr 110 cm Progreat microcatheter and 0.016 inch Fathom microwire was used to gain access to the common then proper then LEFT and RIGHT arteries. Selective DSAs were performed at each level. Finally the target vessel branching off the RIGHT hepatic artery was isolated and a subselective DSA was performed. PURPOSE OF THE ARTERIOGRAM: No previous catheter-directed angiogram was available. Therefore a new complete diagnostic angiography was performed. The decision to proceed with an interventional procedure was made based on this new diagnostic angiogram. Selective branch RIGHT hepatic artery embolization was then performed using Gelfoam slurry. A final angiogram was performed. Images were reviewed and the procedure was terminated. All wires, catheters and sheaths were removed from the patient. Hemostasis was achieved at the RIGHT groin access site with Angio-Seal closure. The patient tolerated the procedure well without immediate post procedural complication. FINDINGS: *Hepatic arteriography with active extravasation at the large RIGHT hepatic lobe mass, originating off a cephalad-projecting branch. *Successful selective Gelfoam slurry embolization of a branch RIGHT hepatic artery, as above. IMPRESSION: Active extravasation from large RIGHT hepatic lobe mass, successfully treated with selective catheter-directed Gelfoam embolization. PLAN: *Post sheath removal precautions including RIGHT lower extremity slight straight x2 hours. *Serial abdominal examinations and H/h trend to evaluate potential ongoing hepatic bleeding. Michaelle Birks, MD [facility] Radiology Electronically Signed   By: Michaelle Birks M.D.   On: 02/06/2023 20:57   IR EMBO ART  VEN HEMORR LYMPH EXTRAV  INC GUIDE ROADMAPPING  Result Date:  02/06/2023 INDICATION: Hemorrhagic liver mass Briefly, 57 year old female with history of multifocal hepatic adenomas with previous resections presenting with hemorrhagic dominant RIGHT hepatic lobe tumor. EXAM: Procedures: 1. CELIAC and HEPATIC ARTERIOGRAPHY 2. GEL FOAM EMBOLIZATION of HEMORRHAGIC RIGHT HEPATIC LOBE MASS COMPARISON:  CT CAP, earlier same day. MEDICATIONS: 3.375 Zosyn IV. The antibiotic was administered within 1 hour of the procedure. 4 mg Zofran IV. ANESTHESIA/SEDATION: Moderate (conscious) sedation was employed during this procedure. A total of Versed 2 mg and Fentanyl 100 mcg was administered intravenously. Moderate Sedation Time: 71 minutes. The patient's level of consciousness and vital signs were monitored continuously by radiology nursing throughout the procedure under my direct supervision. CONTRAST:  50 mL Omnipaque 300 FLUOROSCOPY TIME:  Fluoroscopic dose; [id number] mGy COMPLICATIONS: None immediate. PROCEDURE: Informed consent was obtained from the patient and/or patient's representative following explanation of the procedure, risks, benefits and alternatives. All questions were addressed. A time out was performed prior to the initiation of the procedure. Maximal barrier sterile technique utilized including caps, mask, sterile gowns, sterile gloves, large sterile drape, hand hygiene, and chlorhexidine prep. At the RIGHT groin 1% lidocaine was used for local anesthesia. Limited ultrasound imaging of the groin shows the RIGHT femoral artery to be patent. An ultrasound image was saved and sent to PACS. Arterial access was obtained with a 21-G, 7-cm needle under direct ultrasound guidance, through which a 0.018-inch guidewire was advanced under  fluoroscopy. The 21-G needle was then exchanged for a micropuncture catheter and a 0.035-inch Bentson guidewire. The micropuncture catheter was exchanged for a 5 Fr sheath. Over the wire, a 5 Fr Cobra C2 catheter was positioned within the proximal abdominal  aorta and used to select the celiac axis. Once catheterized, a digital subtraction angiogram (DSA) was obtained. A 2.8 Fr 110 cm Progreat microcatheter and 0.016 inch Fathom microwire was used to gain access to the common then proper then LEFT and RIGHT arteries. Selective DSAs were performed at each level. Finally the target vessel branching off the RIGHT hepatic artery was isolated and a subselective DSA was performed. PURPOSE OF THE ARTERIOGRAM: No previous catheter-directed angiogram was available. Therefore a new complete diagnostic angiography was performed. The decision to proceed with an interventional procedure was made based on this new diagnostic angiogram. Selective branch RIGHT hepatic artery embolization was then performed using Gelfoam slurry. A final angiogram was performed. Images were reviewed and the procedure was terminated. All wires, catheters and sheaths were removed from the patient. Hemostasis was achieved at the RIGHT groin access site with Angio-Seal closure. The patient tolerated the procedure well without immediate post procedural complication. FINDINGS: *Hepatic arteriography with active extravasation at the large RIGHT hepatic lobe mass, originating off a cephalad-projecting branch. *Successful selective Gelfoam slurry embolization of a branch RIGHT hepatic artery, as above. IMPRESSION: Active extravasation from large RIGHT hepatic lobe mass, successfully treated with selective catheter-directed Gelfoam embolization. PLAN: *Post sheath removal precautions including RIGHT lower extremity slight straight x2 hours. *Serial abdominal examinations and H/h trend to evaluate potential ongoing hepatic bleeding. Michaelle Birks, MD [facility] Radiology Electronically Signed   By: Michaelle Birks M.D.   On: 02/06/2023 20:57   IR Angiogram Visceral Selective  Result Date: 02/06/2023 INDICATION: Hemorrhagic liver mass Briefly, 57 year old female with  history of multifocal hepatic adenomas with previous resections presenting with hemorrhagic dominant RIGHT hepatic lobe tumor. EXAM: Procedures: 1. CELIAC and HEPATIC ARTERIOGRAPHY 2. GEL FOAM EMBOLIZATION of HEMORRHAGIC RIGHT HEPATIC LOBE MASS COMPARISON:  CT CAP, earlier same day. MEDICATIONS: 3.375 Zosyn IV. The antibiotic was administered within 1 hour of the procedure. 4 mg Zofran IV. ANESTHESIA/SEDATION: Moderate (conscious) sedation was employed during this procedure. A total of Versed 2 mg and Fentanyl 100 mcg was administered intravenously. Moderate Sedation Time: 71 minutes. The patient's level of consciousness and vital signs were monitored continuously by radiology nursing throughout the procedure under my direct supervision. CONTRAST:  50 mL Omnipaque 300 FLUOROSCOPY TIME:  Fluoroscopic dose; [id number] mGy COMPLICATIONS: None immediate. PROCEDURE: Informed consent was obtained from the patient and/or patient's representative following explanation of the procedure, risks, benefits and alternatives. All questions were addressed. A time out was performed prior to the initiation of the procedure. Maximal barrier sterile technique utilized including caps, mask, sterile gowns, sterile gloves, large sterile drape, hand hygiene, and chlorhexidine prep. At the RIGHT groin 1% lidocaine was used for local anesthesia. Limited ultrasound imaging of the groin shows the RIGHT femoral artery to be patent. An ultrasound image was saved and sent to PACS. Arterial access was obtained with a 21-G, 7-cm needle under direct ultrasound guidance, through which a 0.018-inch guidewire was advanced under fluoroscopy. The 21-G needle was then exchanged for a micropuncture catheter and a 0.035-inch Bentson guidewire. The micropuncture catheter was exchanged for a 5 Fr sheath. Over the wire, a 5 Fr Cobra C2 catheter was positioned within the proximal abdominal aorta and used to select the celiac axis.  Once catheterized, a digital  subtraction angiogram (DSA) was obtained. A 2.8 Fr 110 cm Progreat microcatheter and 0.016 inch Fathom microwire was used to gain access to the common then proper then LEFT and RIGHT arteries. Selective DSAs were performed at each level. Finally the target vessel branching off the RIGHT hepatic artery was isolated and a subselective DSA was performed. PURPOSE OF THE ARTERIOGRAM: No previous catheter-directed angiogram was available. Therefore a new complete diagnostic angiography was performed. The decision to proceed with an interventional procedure was made based on this new diagnostic angiogram. Selective branch RIGHT hepatic artery embolization was then performed using Gelfoam slurry. A final angiogram was performed. Images were reviewed and the procedure was terminated. All wires, catheters and sheaths were removed from the patient. Hemostasis was achieved at the RIGHT groin access site with Angio-Seal closure. The patient tolerated the procedure well without immediate post procedural complication. FINDINGS: *Hepatic arteriography with active extravasation at the large RIGHT hepatic lobe mass, originating off a cephalad-projecting branch. *Successful selective Gelfoam slurry embolization of a branch RIGHT hepatic artery, as above. IMPRESSION: Active extravasation from large RIGHT hepatic lobe mass, successfully treated with selective catheter-directed Gelfoam embolization. PLAN: *Post sheath removal precautions including RIGHT lower extremity slight straight x2 hours. *Serial abdominal examinations and H/h trend to evaluate potential ongoing hepatic bleeding. Michaelle Birks, MD [facility] Radiology Electronically Signed   By: Michaelle Birks M.D.   On: 02/06/2023 20:57   IR Angiogram Selective Each Additional Vessel  Result Date: 02/06/2023 INDICATION: Hemorrhagic liver mass Briefly, 57 year old female with history of multifocal hepatic adenomas with previous  resections presenting with hemorrhagic dominant RIGHT hepatic lobe tumor. EXAM: Procedures: 1. CELIAC and HEPATIC ARTERIOGRAPHY 2. GEL FOAM EMBOLIZATION of HEMORRHAGIC RIGHT HEPATIC LOBE MASS COMPARISON:  CT CAP, earlier same day. MEDICATIONS: 3.375 Zosyn IV. The antibiotic was administered within 1 hour of the procedure. 4 mg Zofran IV. ANESTHESIA/SEDATION: Moderate (conscious) sedation was employed during this procedure. A total of Versed 2 mg and Fentanyl 100 mcg was administered intravenously. Moderate Sedation Time: 71 minutes. The patient's level of consciousness and vital signs were monitored continuously by radiology nursing throughout the procedure under my direct supervision. CONTRAST:  50 mL Omnipaque 300 FLUOROSCOPY TIME:  Fluoroscopic dose; [id number] mGy COMPLICATIONS: None immediate. PROCEDURE: Informed consent was obtained from the patient and/or patient's representative following explanation of the procedure, risks, benefits and alternatives. All questions were addressed. A time out was performed prior to the initiation of the procedure. Maximal barrier sterile technique utilized including caps, mask, sterile gowns, sterile gloves, large sterile drape, hand hygiene, and chlorhexidine prep. At the RIGHT groin 1% lidocaine was used for local anesthesia. Limited ultrasound imaging of the groin shows the RIGHT femoral artery to be patent. An ultrasound image was saved and sent to PACS. Arterial access was obtained with a 21-G, 7-cm needle under direct ultrasound guidance, through which a 0.018-inch guidewire was advanced under fluoroscopy. The 21-G needle was then exchanged for a micropuncture catheter and a 0.035-inch Bentson guidewire. The micropuncture catheter was exchanged for a 5 Fr sheath. Over the wire, a 5 Fr Cobra C2 catheter was positioned within the proximal abdominal aorta and used to select the celiac axis. Once catheterized, a digital subtraction angiogram (DSA) was obtained. A 2.8 Fr 110 cm  Progreat microcatheter and 0.016 inch Fathom microwire was used to gain access to the common then proper then LEFT and RIGHT arteries. Selective DSAs were performed at each level. Finally the target  vessel branching off the RIGHT hepatic artery was isolated and a subselective DSA was performed. PURPOSE OF THE ARTERIOGRAM: No previous catheter-directed angiogram was available. Therefore a new complete diagnostic angiography was performed. The decision to proceed with an interventional procedure was made based on this new diagnostic angiogram. Selective branch RIGHT hepatic artery embolization was then performed using Gelfoam slurry. A final angiogram was performed. Images were reviewed and the procedure was terminated. All wires, catheters and sheaths were removed from the patient. Hemostasis was achieved at the RIGHT groin access site with Angio-Seal closure. The patient tolerated the procedure well without immediate post procedural complication. FINDINGS: *Hepatic arteriography with active extravasation at the large RIGHT hepatic lobe mass, originating off a cephalad-projecting branch. *Successful selective Gelfoam slurry embolization of a branch RIGHT hepatic artery, as above. IMPRESSION: Active extravasation from large RIGHT hepatic lobe mass, successfully treated with selective catheter-directed Gelfoam embolization. PLAN: *Post sheath removal precautions including RIGHT lower extremity slight straight x2 hours. *Serial abdominal examinations and H/h trend to evaluate potential ongoing hepatic bleeding. Michaelle Birks, MD [facility] Radiology Electronically Signed   By: Michaelle Birks M.D.   On: 02/06/2023 20:57   IR Angiogram Selective Each Additional Vessel  Result Date: 02/06/2023 INDICATION: Hemorrhagic liver mass Briefly, 57 year old female with history of multifocal hepatic adenomas with previous resections presenting with hemorrhagic dominant RIGHT hepatic  lobe tumor. EXAM: Procedures: 1. CELIAC and HEPATIC ARTERIOGRAPHY 2. GEL FOAM EMBOLIZATION of HEMORRHAGIC RIGHT HEPATIC LOBE MASS COMPARISON:  CT CAP, earlier same day. MEDICATIONS: 3.375 Zosyn IV. The antibiotic was administered within 1 hour of the procedure. 4 mg Zofran IV. ANESTHESIA/SEDATION: Moderate (conscious) sedation was employed during this procedure. A total of Versed 2 mg and Fentanyl 100 mcg was administered intravenously. Moderate Sedation Time: 71 minutes. The patient's level of consciousness and vital signs were monitored continuously by radiology nursing throughout the procedure under my direct supervision. CONTRAST:  50 mL Omnipaque 300 FLUOROSCOPY TIME:  Fluoroscopic dose; [id number] mGy COMPLICATIONS: None immediate. PROCEDURE: Informed consent was obtained from the patient and/or patient's representative following explanation of the procedure, risks, benefits and alternatives. All questions were addressed. A time out was performed prior to the initiation of the procedure. Maximal barrier sterile technique utilized including caps, mask, sterile gowns, sterile gloves, large sterile drape, hand hygiene, and chlorhexidine prep. At the RIGHT groin 1% lidocaine was used for local anesthesia. Limited ultrasound imaging of the groin shows the RIGHT femoral artery to be patent. An ultrasound image was saved and sent to PACS. Arterial access was obtained with a 21-G, 7-cm needle under direct ultrasound guidance, through which a 0.018-inch guidewire was advanced under fluoroscopy. The 21-G needle was then exchanged for a micropuncture catheter and a 0.035-inch Bentson guidewire. The micropuncture catheter was exchanged for a 5 Fr sheath. Over the wire, a 5 Fr Cobra C2 catheter was positioned within the proximal abdominal aorta and used to select the celiac axis. Once catheterized, a digital subtraction angiogram (DSA) was obtained. A 2.8 Fr 110 cm Progreat microcatheter and 0.016 inch Fathom microwire was used  to gain access to the common then proper then LEFT and RIGHT arteries. Selective DSAs were performed at each level. Finally the target vessel branching off the RIGHT hepatic artery was isolated and a subselective DSA was performed. PURPOSE OF THE ARTERIOGRAM: No previous catheter-directed angiogram was available. Therefore a new complete diagnostic angiography was performed. The decision to proceed with an interventional procedure was made based on this new  diagnostic angiogram. Selective branch RIGHT hepatic artery embolization was then performed using Gelfoam slurry. A final angiogram was performed. Images were reviewed and the procedure was terminated. All wires, catheters and sheaths were removed from the patient. Hemostasis was achieved at the RIGHT groin access site with Angio-Seal closure. The patient tolerated the procedure well without immediate post procedural complication. FINDINGS: *Hepatic arteriography with active extravasation at the large RIGHT hepatic lobe mass, originating off a cephalad-projecting branch. *Successful selective Gelfoam slurry embolization of a branch RIGHT hepatic artery, as above. IMPRESSION: Active extravasation from large RIGHT hepatic lobe mass, successfully treated with selective catheter-directed Gelfoam embolization. PLAN: *Post sheath removal precautions including RIGHT lower extremity slight straight x2 hours. *Serial abdominal examinations and H/h trend to evaluate potential ongoing hepatic bleeding. Michaelle Birks, MD [facility] Radiology Electronically Signed   By: Michaelle Birks M.D.   On: 02/06/2023 20:57   IR Angiogram Selective Each Additional Vessel  Result Date: 02/06/2023 INDICATION: Hemorrhagic liver mass Briefly, 57 year old female with history of multifocal hepatic adenomas with previous resections presenting with hemorrhagic dominant RIGHT hepatic lobe tumor. EXAM: Procedures: 1. CELIAC and HEPATIC ARTERIOGRAPHY  2. GEL FOAM EMBOLIZATION of HEMORRHAGIC RIGHT HEPATIC LOBE MASS COMPARISON:  CT CAP, earlier same day. MEDICATIONS: 3.375 Zosyn IV. The antibiotic was administered within 1 hour of the procedure. 4 mg Zofran IV. ANESTHESIA/SEDATION: Moderate (conscious) sedation was employed during this procedure. A total of Versed 2 mg and Fentanyl 100 mcg was administered intravenously. Moderate Sedation Time: 71 minutes. The patient's level of consciousness and vital signs were monitored continuously by radiology nursing throughout the procedure under my direct supervision. CONTRAST:  50 mL Omnipaque 300 FLUOROSCOPY TIME:  Fluoroscopic dose; [id number] mGy COMPLICATIONS: None immediate. PROCEDURE: Informed consent was obtained from the patient and/or patient's representative following explanation of the procedure, risks, benefits and alternatives. All questions were addressed. A time out was performed prior to the initiation of the procedure. Maximal barrier sterile technique utilized including caps, mask, sterile gowns, sterile gloves, large sterile drape, hand hygiene, and chlorhexidine prep. At the RIGHT groin 1% lidocaine was used for local anesthesia. Limited ultrasound imaging of the groin shows the RIGHT femoral artery to be patent. An ultrasound image was saved and sent to PACS. Arterial access was obtained with a 21-G, 7-cm needle under direct ultrasound guidance, through which a 0.018-inch guidewire was advanced under fluoroscopy. The 21-G needle was then exchanged for a micropuncture catheter and a 0.035-inch Bentson guidewire. The micropuncture catheter was exchanged for a 5 Fr sheath. Over the wire, a 5 Fr Cobra C2 catheter was positioned within the proximal abdominal aorta and used to select the celiac axis. Once catheterized, a digital subtraction angiogram (DSA) was obtained. A 2.8 Fr 110 cm Progreat microcatheter and 0.016 inch Fathom microwire was used to gain access to the common then proper then LEFT and RIGHT  arteries. Selective DSAs were performed at each level. Finally the target vessel branching off the RIGHT hepatic artery was isolated and a subselective DSA was performed. PURPOSE OF THE ARTERIOGRAM: No previous catheter-directed angiogram was available. Therefore a new complete diagnostic angiography was performed. The decision to proceed with an interventional procedure was made based on this new diagnostic angiogram. Selective branch RIGHT hepatic artery embolization was then performed using Gelfoam slurry. A final angiogram was performed. Images were reviewed and the procedure was terminated. All wires, catheters and sheaths were removed from the patient. Hemostasis was achieved at the RIGHT groin access site with  Angio-Seal closure. The patient tolerated the procedure well without immediate post procedural complication. FINDINGS: *Hepatic arteriography with active extravasation at the large RIGHT hepatic lobe mass, originating off a cephalad-projecting branch. *Successful selective Gelfoam slurry embolization of a branch RIGHT hepatic artery, as above. IMPRESSION: Active extravasation from large RIGHT hepatic lobe mass, successfully treated with selective catheter-directed Gelfoam embolization. PLAN: *Post sheath removal precautions including RIGHT lower extremity slight straight x2 hours. *Serial abdominal examinations and H/h trend to evaluate potential ongoing hepatic bleeding. Michaelle Birks, MD [facility] Radiology Electronically Signed   By: Michaelle Birks M.D.   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 EXAM: CT CHEST, ABDOMEN, AND PELVIS WITH CONTRAST TECHNIQUE: Multidetector CT imaging of the chest, abdomen and pelvis was performed following the standard protocol during bolus administration  of intravenous contrast. RADIATION DOSE REDUCTION: This exam was performed according to the departmental dose-optimization program which includes automated exposure control, adjustment of the mA and/or kV according to patient size and/or use of iterative reconstruction technique. CONTRAST:  179m OMNIPAQUE IOHEXOL 300 MG/ML  SOLN COMPARISON:  CT AP, 02/02/2023 and 03/26/2021. Chest XR, concurrent. FINDINGS: CT CHEST FINDINGS Cardiovascular: No acute intrathoracic vascular abnormality. Normal heart size. No pericardial effusion. Mediastinum/Nodes: No enlarged mediastinal, hilar, or axillary lymph nodes. Thyroid gland, trachea, and esophagus demonstrate no significant findings. Lungs/Pleura: Bilateral 8 mm subsolid pulmonary nodules, within the LEFT upper and RIGHT lower lobes. Additional 5 mm nodularity along the RIGHT major fissure is likely to represent an intrapulmonary lymph node. Lungs are otherwise clear without focal consolidation or mass. No pleural effusion or pneumothorax. Musculoskeletal: No acute chest wall mass or suspicious bone lesion. CT ABDOMEN PELVIS FINDINGS Hepatobiliary: *Dominant RIGHT superior hepatic lobe mass appears heterogeneously dense with new prominent hypodense component. *The mass measures approximately 8.0 x 10.2 x 10.0 cm (AP by transaxial by CC), previously 9.9 x 12.8 (AP by transaxial). *Question enhancing focus medial to the mass, suspicious for angiographic spot sign and active extravasation. *Postsurgical changes of prior RIGHT and LEFT liver mass resections. Status post cholecystectomy. No biliary dilatation. Pancreas: No pancreatic ductal dilatation. Spleen: Normal in size without focal abnormality. Adrenals/Urinary Tract: Adrenal glands are unremarkable. Kidneys are normal, without renal calculi, focal lesion, or hydronephrosis. Bladder is unremarkable. Stomach/Bowel: Stomach is within normal limits. Appendix appears normal. Nonobstructed small bowel. Nondilated colon.  Contrast opacification of distal small bowel and colon without extraluminal extravasation. No evidence of bowel wall thickening, distention, or inflammatory changes. Vascular/Lymphatic: No significant vascular findings are present. No enlarged abdominal or pelvic lymph nodes. Reproductive: Peripherally-calcified RIGHT fibroid. Prominent ovarian follicles. No adnexal mass. Other: Trace perirenal, peripancreatic and anterior pararenal space fat stranding. Trace body wall edema. No abdominal wall hernia or abnormality. No abdominopelvic ascites. Musculoskeletal: No acute or significant osseous findings. IMPRESSION: 1. Question of hemorrhagic conversion of dominant RIGHT hepatic lobe mass, previously characterized as adenoma. Small focus of enhancement medial to the mass is suspicious for active extravasation. See key image. 2. Trace LEFT perirenal and anterior pararenal space fat stranding is nonspecific. Early pyelonephritis can appear similar. Correlate with urinalysis. 3. Bilateral 8 mm part-solid pulmonary nodules. Per Fleischner Society Guidelines, recommend a non-contrast Chest CT at 3-6 months. These guidelines do not apply to immunocompromised patients and patients with cancer. Reference: Radiology. 2017; 284(1):228-43. These results were called by telephone at  the time of interpretation on 02/06/2023 at 3:46 pm to provider [facility] , who verbally acknowledged these results. Electronically Signed   By: Michaelle Birks M.D.   On: 02/06/2023 15:51   DG Chest 1 View  Result Date: 02/06/2023 CLINICAL DATA:  Sepsis EXAM: CHEST  1 VIEW COMPARISON:  01/06/2019 FINDINGS: Heart and mediastinal contours are within normal limits. No focal opacities or effusions. No acute bony abnormality. IMPRESSION: No active disease. Electronically Signed   By: Rolm Baptise M.D.   On: 02/06/2023 00:37    Anti-infectives: Anti-infectives (From admission, onward)    Start     Dose/Rate Route Frequency Ordered Stop   02/07/23  0200  vancomycin (VANCOREADY) IVPB 1250 mg/250 mL        1,250 mg 166.7 mL/hr over 90 Minutes Intravenous Every 24 hours 02/06/23 0334     02/06/23 1928  piperacillin-tazobactam (ZOSYN) IVPB        over 240 Minutes  Continuous PRN 02/06/23 1932 02/06/23 1928   02/06/23 0800  ceFEPIme (MAXIPIME) 2 g in sodium chloride 0.9 % 100 mL IVPB        2 g 200 mL/hr over 30 Minutes Intravenous Every 8 hours 02/06/23 0103     02/06/23 0100  ceFEPIme (MAXIPIME) 2 g in sodium chloride 0.9 % 100 mL IVPB        2 g 200 mL/hr over 30 Minutes Intravenous  Once 02/06/23 0006 02/06/23 0051   02/06/23 0100  metroNIDAZOLE (FLAGYL) IVPB 500 mg        500 mg 100 mL/hr over 60 Minutes Intravenous Every 12 hours 02/06/23 0006 02/13/23 0059   02/06/23 0100  vancomycin (VANCOCIN) IVPB 1000 mg/200 mL premix  Status:  Discontinued        1,000 mg 200 mL/hr over 60 Minutes Intravenous  Once 02/06/23 0006 02/06/23 0011   02/06/23 0100  vancomycin (VANCOREADY) IVPB 1750 mg/350 mL        1,750 mg 175 mL/hr over 120 Minutes Intravenous  Once 02/06/23 0011 02/06/23 0443        Assessment/Plan  SBO Hepatic adenoma with hemorrhage - CT on admission w/ dilated mid small bowel in the central left hemiabdomen with transition in midline ventral abdomen. Hx left lateral and partial right hepatectomy's as well as a cholecystectomy.  - Xray 2/7 very reassuring with no obvious dilated small bowel and contrast in colon.  - CT yesterday afternoon with right hepatic adenoma with hemorrhagic conversion - s/p IR embolization overnight  - hgb 8.8 from 9.2 this AM, not on pressors this AM - KUB this AM, if ok likely start CLD - mobilize as able  - recommend keeping K>4.0 and Mg >2.0 to optimize bowel function - have ordered replacement of both Mg and K this AM - no emergent surgical intervention warranted at this time   FEN - ice chips. SLIV. K 3.0 (getting replaced) VTE - SCDs ID - Cefepime/Vanc/Flagyl. Tmax 100.5. WBC down at  13.   LOS: 5 days   I reviewed Consultant IR notes, hospitalist notes, last 24 h vitals and pain scores, last 48 h intake and output, last 24 h labs and trends, and last 24 h imaging results.    Norm Parcel, [facility] Surgery 02/07/2023, 9:40 AM Please see Amion for pager number during day hours 7:00am-4:30pm

## 2023-02-07 NOTE — Progress Notes (Addendum)
Referring Physician(s): Tsuei,M  Supervising Physician: Aletta Edouard  Patient Status:  [facility] - In-pt  Chief Complaint:  Abdominal/back pain  Subjective: Pt doing ok today; has some mild- mod abd/back pain; wants something to drink; no BM yet; abd film today with no obstruction; denies fever, HA,CP,dyspnea, N/V or visible bleeding   Allergies: Patient has no known allergies.  Medications: Prior to Admission medications   Medication Sig Start Date End Date Taking? Authorizing Provider  acetaminophen (TYLENOL) 325 MG tablet Take 2 tablets (650 mg total) by mouth every 6 (six) hours as needed for mild pain (or Fever >/= 101). Patient not taking: Reported on 02/02/2023 01/09/19   [name], MD  ceFEPIme 1 g in sodium chloride 0.9 % 100 mL Inject 1 g into the vein every 8 (eight) hours. Patient not taking: Reported on 02/02/2023 01/09/19   [name], MD  hydrALAZINE (APRESOLINE) 20 MG/ML injection Inject 0.5 mLs (10 mg total) into the vein every 6 (six) hours as needed (SBP more than 150). Patient not taking: Reported on 02/02/2023 01/09/19   [name], MD  HYDROmorphone (DILAUDID) 1 MG/ML injection Inject 0.5 mLs (0.5 mg total) into the vein every 2 (two) hours as needed for moderate pain. Patient not taking: Reported on 02/02/2023 01/09/19   [name], MD  levalbuterol Penne Lash) 0.63 MG/3ML nebulizer solution Take 3 mLs (0.63 mg total) by nebulization every 6 (six) hours as needed for wheezing or shortness of breath. Patient not taking: Reported on 02/02/2023 01/09/19   [name], MD  LORazepam (ATIVAN) 0.5 MG tablet Take 1 tablet (0.5 mg total) by mouth every 6 (six) hours as needed for anxiety. Patient not taking: Reported on 02/02/2023 01/09/19   [name], MD  mouth rinse LIQD solution 15 mLs by Mouth Rinse route 2 (two) times daily. Patient not taking: Reported on 02/02/2023 01/09/19   [name], MD  ondansetron (ZOFRAN) 4 MG tablet Take 1 tablet (4 mg  total) by mouth every 6 (six) hours as needed for nausea. Patient not taking: Reported on 02/02/2023 01/09/19   [name], MD  oxyCODONE (OXY IR/ROXICODONE) 5 MG immediate release tablet Take 1-2 tablets (5-10 mg total) by mouth every 6 (six) hours as needed for moderate pain. Patient not taking: Reported on 02/02/2023 01/09/19   [name], MD  pantoprazole (PROTONIX) 40 MG injection Inject 40 mg into the vein every 12 (twelve) hours. Patient not taking: Reported on 02/02/2023 01/09/19   [name], MD  polyethylene glycol [facility] Floria Raveling) packet Take 17 g by mouth daily as needed for mild constipation. Patient not taking: Reported on 02/02/2023 01/09/19   [name], MD  senna-docusate (SENOKOT-S) 8.6-50 MG tablet Take 1 tablet by mouth at bedtime. Patient not taking: Reported on 02/02/2023 01/09/19   [name], MD     Vital Signs: BP (!) 142/70   Pulse 92   Temp 99.4 F (37.4 C) (Oral)   Resp 19   Ht 5' 4"$  (1.626 m)   Wt 183 lb 3.2 oz (83.1 kg)   SpO2 97%   BMI 31.45 kg/m   Physical Exam awake/alert; abd mildly dist, some tenderness lower regions, access site rt CFA soft, mildly tender, no discrete hematoma, intact distal pulses  Imaging: DG Abd 1 View  Result Date: 02/07/2023 CLINICAL DATA:  Ileus.  Liver embolization yesterday. EXAM: ABDOMEN - 1 VIEW COMPARISON:  CT abdomen 02/06/2023 FINDINGS: Contrast medium noted in the colon. No dilated small bowel identified, a left abdominal small  bowel loop measures 2.6 cm in diameter. Calcified uterine fibroid noted. Stable high density material lining the inferior margin of the right hepatic lobe likely residual from prior resections. Contrast medium in the urinary bladder. IMPRESSION: 1. No dilated small bowel identified. Contrast medium in the colon. 2. Calcified uterine fibroid. 3. Contrast medium in the urinary bladder. Electronically Signed   By: Van Clines M.D.   On: 02/07/2023 10:05   IR US Guide Vasc Access  Right  Result Date: 02/06/2023 INDICATION: Hemorrhagic liver mass Briefly, 57 year old female with history of multifocal hepatic adenomas with previous resections presenting with hemorrhagic dominant RIGHT hepatic lobe tumor. EXAM: Procedures: 1. CELIAC and HEPATIC ARTERIOGRAPHY 2. GEL FOAM EMBOLIZATION of HEMORRHAGIC RIGHT HEPATIC LOBE MASS COMPARISON:  CT CAP, earlier same day. MEDICATIONS: 3.375 Zosyn IV. The antibiotic was administered within 1 hour of the procedure. 4 mg Zofran IV. ANESTHESIA/SEDATION: Moderate (conscious) sedation was employed during this procedure. A total of Versed 2 mg and Fentanyl 100 mcg was administered intravenously. Moderate Sedation Time: 71 minutes. The patient's level of consciousness and vital signs were monitored continuously by radiology nursing throughout the procedure under my direct supervision. CONTRAST:  50 mL Omnipaque 300 FLUOROSCOPY TIME:  Fluoroscopic dose; AB-123456789 mGy COMPLICATIONS: None immediate. PROCEDURE: Informed consent was obtained from the patient and/or patient's representative following explanation of the procedure, risks, benefits and alternatives. All questions were addressed. A time out was performed prior to the initiation of the procedure. Maximal barrier sterile technique utilized including caps, mask, sterile gowns, sterile gloves, large sterile drape, hand hygiene, and chlorhexidine prep. At the RIGHT groin 1% lidocaine was used for local anesthesia. Limited ultrasound imaging of the groin shows the RIGHT femoral artery to be patent. An ultrasound image was saved and sent to PACS. Arterial access was obtained with a 21-G, 7-cm needle under direct ultrasound guidance, through which a 0.018-inch guidewire was advanced under fluoroscopy. The 21-G needle was then exchanged for a micropuncture catheter and a 0.035-inch Bentson guidewire. The micropuncture catheter was exchanged for a 5 Fr sheath. Over the wire, a 5 Fr Cobra C2 catheter was positioned within  the proximal abdominal aorta and used to select the celiac axis. Once catheterized, a digital subtraction angiogram (DSA) was obtained. A 2.8 Fr 110 cm Progreat microcatheter and 0.016 inch Fathom microwire was used to gain access to the common then proper then LEFT and RIGHT arteries. Selective DSAs were performed at each level. Finally the target vessel branching off the RIGHT hepatic artery was isolated and a subselective DSA was performed. PURPOSE OF THE ARTERIOGRAM: No previous catheter-directed angiogram was available. Therefore a new complete diagnostic angiography was performed. The decision to proceed with an interventional procedure was made based on this new diagnostic angiogram. Selective branch RIGHT hepatic artery embolization was then performed using Gelfoam slurry. A final angiogram was performed. Images were reviewed and the procedure was terminated. All wires, catheters and sheaths were removed from the patient. Hemostasis was achieved at the RIGHT groin access site with Angio-Seal closure. The patient tolerated the procedure well without immediate post procedural complication. FINDINGS: *Hepatic arteriography with active extravasation at the large RIGHT hepatic lobe mass, originating off a cephalad-projecting branch. *Successful selective Gelfoam slurry embolization of a branch RIGHT hepatic artery, as above. IMPRESSION: Active extravasation from large RIGHT hepatic lobe mass, successfully treated with selective catheter-directed Gelfoam embolization. PLAN: *Post sheath removal precautions including RIGHT lower extremity slight straight x2 hours. *Serial abdominal examinations and H/h trend to evaluate potential ongoing hepatic  bleeding. Michaelle Birks, MD [facility] Radiology Electronically Signed   By: Michaelle Birks M.D.   On: 02/06/2023 20:57   IR EMBO ART  VEN HEMORR LYMPH EXTRAV  INC GUIDE ROADMAPPING  Result Date: 02/06/2023 INDICATION:  Hemorrhagic liver mass Briefly, 57 year old female with history of multifocal hepatic adenomas with previous resections presenting with hemorrhagic dominant RIGHT hepatic lobe tumor. EXAM: Procedures: 1. CELIAC and HEPATIC ARTERIOGRAPHY 2. GEL FOAM EMBOLIZATION of HEMORRHAGIC RIGHT HEPATIC LOBE MASS COMPARISON:  CT CAP, earlier same day. MEDICATIONS: 3.375 Zosyn IV. The antibiotic was administered within 1 hour of the procedure. 4 mg Zofran IV. ANESTHESIA/SEDATION: Moderate (conscious) sedation was employed during this procedure. A total of Versed 2 mg and Fentanyl 100 mcg was administered intravenously. Moderate Sedation Time: 71 minutes. The patient's level of consciousness and vital signs were monitored continuously by radiology nursing throughout the procedure under my direct supervision. CONTRAST:  50 mL Omnipaque 300 FLUOROSCOPY TIME:  Fluoroscopic dose; [id number] mGy COMPLICATIONS: None immediate. PROCEDURE: Informed consent was obtained from the patient and/or patient's representative following explanation of the procedure, risks, benefits and alternatives. All questions were addressed. A time out was performed prior to the initiation of the procedure. Maximal barrier sterile technique utilized including caps, mask, sterile gowns, sterile gloves, large sterile drape, hand hygiene, and chlorhexidine prep. At the RIGHT groin 1% lidocaine was used for local anesthesia. Limited ultrasound imaging of the groin shows the RIGHT femoral artery to be patent. An ultrasound image was saved and sent to PACS. Arterial access was obtained with a 21-G, 7-cm needle under direct ultrasound guidance, through which a 0.018-inch guidewire was advanced under fluoroscopy. The 21-G needle was then exchanged for a micropuncture catheter and a 0.035-inch Bentson guidewire. The micropuncture catheter was exchanged for a 5 Fr sheath. Over the wire, a 5 Fr Cobra C2 catheter was positioned within the proximal abdominal aorta and used to  select the celiac axis. Once catheterized, a digital subtraction angiogram (DSA) was obtained. A 2.8 Fr 110 cm Progreat microcatheter and 0.016 inch Fathom microwire was used to gain access to the common then proper then LEFT and RIGHT arteries. Selective DSAs were performed at each level. Finally the target vessel branching off the RIGHT hepatic artery was isolated and a subselective DSA was performed. PURPOSE OF THE ARTERIOGRAM: No previous catheter-directed angiogram was available. Therefore a new complete diagnostic angiography was performed. The decision to proceed with an interventional procedure was made based on this new diagnostic angiogram. Selective branch RIGHT hepatic artery embolization was then performed using Gelfoam slurry. A final angiogram was performed. Images were reviewed and the procedure was terminated. All wires, catheters and sheaths were removed from the patient. Hemostasis was achieved at the RIGHT groin access site with Angio-Seal closure. The patient tolerated the procedure well without immediate post procedural complication. FINDINGS: *Hepatic arteriography with active extravasation at the large RIGHT hepatic lobe mass, originating off a cephalad-projecting branch. *Successful selective Gelfoam slurry embolization of a branch RIGHT hepatic artery, as above. IMPRESSION: Active extravasation from large RIGHT hepatic lobe mass, successfully treated with selective catheter-directed Gelfoam embolization. PLAN: *Post sheath removal precautions including RIGHT lower extremity slight straight x2 hours. *Serial abdominal examinations and H/h trend to evaluate potential ongoing hepatic bleeding. Michaelle Birks, MD [facility] Radiology Electronically Signed   By: Michaelle Birks M.D.   On: 02/06/2023 20:57   IR Angiogram Visceral Selective  Result Date: 02/06/2023 INDICATION: Hemorrhagic liver mass Briefly, 57 year old  female with history of  multifocal hepatic adenomas with previous resections presenting with hemorrhagic dominant RIGHT hepatic lobe tumor. EXAM: Procedures: 1. CELIAC and HEPATIC ARTERIOGRAPHY 2. GEL FOAM EMBOLIZATION of HEMORRHAGIC RIGHT HEPATIC LOBE MASS COMPARISON:  CT CAP, earlier same day. MEDICATIONS: 3.375 Zosyn IV. The antibiotic was administered within 1 hour of the procedure. 4 mg Zofran IV. ANESTHESIA/SEDATION: Moderate (conscious) sedation was employed during this procedure. A total of Versed 2 mg and Fentanyl 100 mcg was administered intravenously. Moderate Sedation Time: 71 minutes. The patient's level of consciousness and vital signs were monitored continuously by radiology nursing throughout the procedure under my direct supervision. CONTRAST:  50 mL Omnipaque 300 FLUOROSCOPY TIME:  Fluoroscopic dose; [id number] mGy COMPLICATIONS: None immediate. PROCEDURE: Informed consent was obtained from the patient and/or patient's representative following explanation of the procedure, risks, benefits and alternatives. All questions were addressed. A time out was performed prior to the initiation of the procedure. Maximal barrier sterile technique utilized including caps, mask, sterile gowns, sterile gloves, large sterile drape, hand hygiene, and chlorhexidine prep. At the RIGHT groin 1% lidocaine was used for local anesthesia. Limited ultrasound imaging of the groin shows the RIGHT femoral artery to be patent. An ultrasound image was saved and sent to PACS. Arterial access was obtained with a 21-G, 7-cm needle under direct ultrasound guidance, through which a 0.018-inch guidewire was advanced under fluoroscopy. The 21-G needle was then exchanged for a micropuncture catheter and a 0.035-inch Bentson guidewire. The micropuncture catheter was exchanged for a 5 Fr sheath. Over the wire, a 5 Fr Cobra C2 catheter was positioned within the proximal abdominal aorta and used to select the celiac axis. Once catheterized, a digital subtraction  angiogram (DSA) was obtained. A 2.8 Fr 110 cm Progreat microcatheter and 0.016 inch Fathom microwire was used to gain access to the common then proper then LEFT and RIGHT arteries. Selective DSAs were performed at each level. Finally the target vessel branching off the RIGHT hepatic artery was isolated and a subselective DSA was performed. PURPOSE OF THE ARTERIOGRAM: No previous catheter-directed angiogram was available. Therefore a new complete diagnostic angiography was performed. The decision to proceed with an interventional procedure was made based on this new diagnostic angiogram. Selective branch RIGHT hepatic artery embolization was then performed using Gelfoam slurry. A final angiogram was performed. Images were reviewed and the procedure was terminated. All wires, catheters and sheaths were removed from the patient. Hemostasis was achieved at the RIGHT groin access site with Angio-Seal closure. The patient tolerated the procedure well without immediate post procedural complication. FINDINGS: *Hepatic arteriography with active extravasation at the large RIGHT hepatic lobe mass, originating off a cephalad-projecting branch. *Successful selective Gelfoam slurry embolization of a branch RIGHT hepatic artery, as above. IMPRESSION: Active extravasation from large RIGHT hepatic lobe mass, successfully treated with selective catheter-directed Gelfoam embolization. PLAN: *Post sheath removal precautions including RIGHT lower extremity slight straight x2 hours. *Serial abdominal examinations and H/h trend to evaluate potential ongoing hepatic bleeding. Michaelle Birks, MD [facility] Radiology Electronically Signed   By: Michaelle Birks M.D.   On: 02/06/2023 20:57   IR Angiogram Selective Each Additional Vessel  Result Date: 02/06/2023 INDICATION: Hemorrhagic liver mass Briefly, 57 year old female with history of multifocal hepatic adenomas with previous resections  presenting with hemorrhagic dominant RIGHT hepatic lobe tumor. EXAM: Procedures: 1. CELIAC and HEPATIC ARTERIOGRAPHY 2. GEL FOAM EMBOLIZATION of HEMORRHAGIC RIGHT HEPATIC LOBE MASS COMPARISON:  CT CAP, earlier same day. MEDICATIONS: 3.375 Zosyn IV.  The antibiotic was administered within 1 hour of the procedure. 4 mg Zofran IV. ANESTHESIA/SEDATION: Moderate (conscious) sedation was employed during this procedure. A total of Versed 2 mg and Fentanyl 100 mcg was administered intravenously. Moderate Sedation Time: 71 minutes. The patient's level of consciousness and vital signs were monitored continuously by radiology nursing throughout the procedure under my direct supervision. CONTRAST:  50 mL Omnipaque 300 FLUOROSCOPY TIME:  Fluoroscopic dose; [id number] mGy COMPLICATIONS: None immediate. PROCEDURE: Informed consent was obtained from the patient and/or patient's representative following explanation of the procedure, risks, benefits and alternatives. All questions were addressed. A time out was performed prior to the initiation of the procedure. Maximal barrier sterile technique utilized including caps, mask, sterile gowns, sterile gloves, large sterile drape, hand hygiene, and chlorhexidine prep. At the RIGHT groin 1% lidocaine was used for local anesthesia. Limited ultrasound imaging of the groin shows the RIGHT femoral artery to be patent. An ultrasound image was saved and sent to PACS. Arterial access was obtained with a 21-G, 7-cm needle under direct ultrasound guidance, through which a 0.018-inch guidewire was advanced under fluoroscopy. The 21-G needle was then exchanged for a micropuncture catheter and a 0.035-inch Bentson guidewire. The micropuncture catheter was exchanged for a 5 Fr sheath. Over the wire, a 5 Fr Cobra C2 catheter was positioned within the proximal abdominal aorta and used to select the celiac axis. Once catheterized, a digital subtraction angiogram (DSA) was obtained. A 2.8 Fr 110 cm Progreat  microcatheter and 0.016 inch Fathom microwire was used to gain access to the common then proper then LEFT and RIGHT arteries. Selective DSAs were performed at each level. Finally the target vessel branching off the RIGHT hepatic artery was isolated and a subselective DSA was performed. PURPOSE OF THE ARTERIOGRAM: No previous catheter-directed angiogram was available. Therefore a new complete diagnostic angiography was performed. The decision to proceed with an interventional procedure was made based on this new diagnostic angiogram. Selective branch RIGHT hepatic artery embolization was then performed using Gelfoam slurry. A final angiogram was performed. Images were reviewed and the procedure was terminated. All wires, catheters and sheaths were removed from the patient. Hemostasis was achieved at the RIGHT groin access site with Angio-Seal closure. The patient tolerated the procedure well without immediate post procedural complication. FINDINGS: *Hepatic arteriography with active extravasation at the large RIGHT hepatic lobe mass, originating off a cephalad-projecting branch. *Successful selective Gelfoam slurry embolization of a branch RIGHT hepatic artery, as above. IMPRESSION: Active extravasation from large RIGHT hepatic lobe mass, successfully treated with selective catheter-directed Gelfoam embolization. PLAN: *Post sheath removal precautions including RIGHT lower extremity slight straight x2 hours. *Serial abdominal examinations and H/h trend to evaluate potential ongoing hepatic bleeding. Michaelle Birks, MD [facility] Radiology Electronically Signed   By: Michaelle Birks M.D.   On: 02/06/2023 20:57   IR Angiogram Selective Each Additional Vessel  Result Date: 02/06/2023 INDICATION: Hemorrhagic liver mass Briefly, 56 year old female with history of multifocal hepatic adenomas with previous resections presenting with hemorrhagic dominant RIGHT hepatic lobe  tumor. EXAM: Procedures: 1. CELIAC and HEPATIC ARTERIOGRAPHY 2. GEL FOAM EMBOLIZATION of HEMORRHAGIC RIGHT HEPATIC LOBE MASS COMPARISON:  CT CAP, earlier same day. MEDICATIONS: 3.375 Zosyn IV. The antibiotic was administered within 1 hour of the procedure. 4 mg Zofran IV. ANESTHESIA/SEDATION: Moderate (conscious) sedation was employed during this procedure. A total of Versed 2 mg and Fentanyl 100 mcg was administered intravenously. Moderate Sedation Time: 71 minutes. The patient's level of consciousness and  vital signs were monitored continuously by radiology nursing throughout the procedure under my direct supervision. CONTRAST:  50 mL Omnipaque 300 FLUOROSCOPY TIME:  Fluoroscopic dose; [id number] mGy COMPLICATIONS: None immediate. PROCEDURE: Informed consent was obtained from the patient and/or patient's representative following explanation of the procedure, risks, benefits and alternatives. All questions were addressed. A time out was performed prior to the initiation of the procedure. Maximal barrier sterile technique utilized including caps, mask, sterile gowns, sterile gloves, large sterile drape, hand hygiene, and chlorhexidine prep. At the RIGHT groin 1% lidocaine was used for local anesthesia. Limited ultrasound imaging of the groin shows the RIGHT femoral artery to be patent. An ultrasound image was saved and sent to PACS. Arterial access was obtained with a 21-G, 7-cm needle under direct ultrasound guidance, through which a 0.018-inch guidewire was advanced under fluoroscopy. The 21-G needle was then exchanged for a micropuncture catheter and a 0.035-inch Bentson guidewire. The micropuncture catheter was exchanged for a 5 Fr sheath. Over the wire, a 5 Fr Cobra C2 catheter was positioned within the proximal abdominal aorta and used to select the celiac axis. Once catheterized, a digital subtraction angiogram (DSA) was obtained. A 2.8 Fr 110 cm Progreat microcatheter and 0.016 inch Fathom microwire was used to  gain access to the common then proper then LEFT and RIGHT arteries. Selective DSAs were performed at each level. Finally the target vessel branching off the RIGHT hepatic artery was isolated and a subselective DSA was performed. PURPOSE OF THE ARTERIOGRAM: No previous catheter-directed angiogram was available. Therefore a new complete diagnostic angiography was performed. The decision to proceed with an interventional procedure was made based on this new diagnostic angiogram. Selective branch RIGHT hepatic artery embolization was then performed using Gelfoam slurry. A final angiogram was performed. Images were reviewed and the procedure was terminated. All wires, catheters and sheaths were removed from the patient. Hemostasis was achieved at the RIGHT groin access site with Angio-Seal closure. The patient tolerated the procedure well without immediate post procedural complication. FINDINGS: *Hepatic arteriography with active extravasation at the large RIGHT hepatic lobe mass, originating off a cephalad-projecting branch. *Successful selective Gelfoam slurry embolization of a branch RIGHT hepatic artery, as above. IMPRESSION: Active extravasation from large RIGHT hepatic lobe mass, successfully treated with selective catheter-directed Gelfoam embolization. PLAN: *Post sheath removal precautions including RIGHT lower extremity slight straight x2 hours. *Serial abdominal examinations and H/h trend to evaluate potential ongoing hepatic bleeding. Michaelle Birks, MD [facility] Radiology Electronically Signed   By: Michaelle Birks M.D.   On: 02/06/2023 20:57   IR Angiogram Selective Each Additional Vessel  Result Date: 02/06/2023 INDICATION: Hemorrhagic liver mass Briefly, 57 year old female with history of multifocal hepatic adenomas with previous resections presenting with hemorrhagic dominant RIGHT hepatic lobe tumor. EXAM: Procedures: 1. CELIAC and HEPATIC ARTERIOGRAPHY 2.  GEL FOAM EMBOLIZATION of HEMORRHAGIC RIGHT HEPATIC LOBE MASS COMPARISON:  CT CAP, earlier same day. MEDICATIONS: 3.375 Zosyn IV. The antibiotic was administered within 1 hour of the procedure. 4 mg Zofran IV. ANESTHESIA/SEDATION: Moderate (conscious) sedation was employed during this procedure. A total of Versed 2 mg and Fentanyl 100 mcg was administered intravenously. Moderate Sedation Time: 71 minutes. The patient's level of consciousness and vital signs were monitored continuously by radiology nursing throughout the procedure under my direct supervision. CONTRAST:  50 mL Omnipaque 300 FLUOROSCOPY TIME:  Fluoroscopic dose; [id number] mGy COMPLICATIONS: None immediate. PROCEDURE: Informed consent was obtained from the patient and/or patient's representative following explanation of the procedure,  risks, benefits and alternatives. All questions were addressed. A time out was performed prior to the initiation of the procedure. Maximal barrier sterile technique utilized including caps, mask, sterile gowns, sterile gloves, large sterile drape, hand hygiene, and chlorhexidine prep. At the RIGHT groin 1% lidocaine was used for local anesthesia. Limited ultrasound imaging of the groin shows the RIGHT femoral artery to be patent. An ultrasound image was saved and sent to PACS. Arterial access was obtained with a 21-G, 7-cm needle under direct ultrasound guidance, through which a 0.018-inch guidewire was advanced under fluoroscopy. The 21-G needle was then exchanged for a micropuncture catheter and a 0.035-inch Bentson guidewire. The micropuncture catheter was exchanged for a 5 Fr sheath. Over the wire, a 5 Fr Cobra C2 catheter was positioned within the proximal abdominal aorta and used to select the celiac axis. Once catheterized, a digital subtraction angiogram (DSA) was obtained. A 2.8 Fr 110 cm Progreat microcatheter and 0.016 inch Fathom microwire was used to gain access to the common then proper then LEFT and RIGHT arteries.  Selective DSAs were performed at each level. Finally the target vessel branching off the RIGHT hepatic artery was isolated and a subselective DSA was performed. PURPOSE OF THE ARTERIOGRAM: No previous catheter-directed angiogram was available. Therefore a new complete diagnostic angiography was performed. The decision to proceed with an interventional procedure was made based on this new diagnostic angiogram. Selective branch RIGHT hepatic artery embolization was then performed using Gelfoam slurry. A final angiogram was performed. Images were reviewed and the procedure was terminated. All wires, catheters and sheaths were removed from the patient. Hemostasis was achieved at the RIGHT groin access site with Angio-Seal closure. The patient tolerated the procedure well without immediate post procedural complication. FINDINGS: *Hepatic arteriography with active extravasation at the large RIGHT hepatic lobe mass, originating off a cephalad-projecting branch. *Successful selective Gelfoam slurry embolization of a branch RIGHT hepatic artery, as above. IMPRESSION: Active extravasation from large RIGHT hepatic lobe mass, successfully treated with selective catheter-directed Gelfoam embolization. PLAN: *Post sheath removal precautions including RIGHT lower extremity slight straight x2 hours. *Serial abdominal examinations and H/h trend to evaluate potential ongoing hepatic bleeding. Michaelle Birks, MD [facility] Radiology Electronically Signed   By: Michaelle Birks M.D.   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 EXAM: CT CHEST, ABDOMEN, AND PELVIS WITH CONTRAST TECHNIQUE: Multidetector CT imaging of the chest, abdomen and pelvis was performed following the standard protocol during bolus administration of  intravenous contrast. RADIATION DOSE REDUCTION: This exam was performed according to the departmental dose-optimization program which includes automated exposure control, adjustment of the mA and/or kV according to patient size and/or use of iterative reconstruction technique. CONTRAST:  134m OMNIPAQUE IOHEXOL 300 MG/ML  SOLN COMPARISON:  CT AP, 02/02/2023 and 03/26/2021. Chest XR, concurrent. FINDINGS: CT CHEST FINDINGS Cardiovascular: No acute intrathoracic vascular abnormality. Normal heart size. No pericardial effusion. Mediastinum/Nodes: No enlarged mediastinal, hilar, or axillary lymph nodes. Thyroid gland, trachea, and esophagus demonstrate no significant findings. Lungs/Pleura: Bilateral 8 mm subsolid pulmonary nodules, within the LEFT upper and RIGHT lower lobes. Additional 5 mm nodularity along the RIGHT major fissure is likely to represent an intrapulmonary lymph node. Lungs are otherwise clear without focal consolidation or mass. No pleural effusion or pneumothorax. Musculoskeletal: No acute chest wall mass or suspicious bone lesion. CT ABDOMEN  PELVIS FINDINGS Hepatobiliary: *Dominant RIGHT superior hepatic lobe mass appears heterogeneously dense with new prominent hypodense component. *The mass measures approximately 8.0 x 10.2 x 10.0 cm (AP by transaxial by CC), previously 9.9 x 12.8 (AP by transaxial). *Question enhancing focus medial to the mass, suspicious for angiographic spot sign and active extravasation. *Postsurgical changes of prior RIGHT and LEFT liver mass resections. Status post cholecystectomy. No biliary dilatation. Pancreas: No pancreatic ductal dilatation. Spleen: Normal in size without focal abnormality. Adrenals/Urinary Tract: Adrenal glands are unremarkable. Kidneys are normal, without renal calculi, focal lesion, or hydronephrosis. Bladder is unremarkable. Stomach/Bowel: Stomach is within normal limits. Appendix appears normal. Nonobstructed small bowel. Nondilated colon. Contrast  opacification of distal small bowel and colon without extraluminal extravasation. No evidence of bowel wall thickening, distention, or inflammatory changes. Vascular/Lymphatic: No significant vascular findings are present. No enlarged abdominal or pelvic lymph nodes. Reproductive: Peripherally-calcified RIGHT fibroid. Prominent ovarian follicles. No adnexal mass. Other: Trace perirenal, peripancreatic and anterior pararenal space fat stranding. Trace body wall edema. No abdominal wall hernia or abnormality. No abdominopelvic ascites. Musculoskeletal: No acute or significant osseous findings. IMPRESSION: 1. Question of hemorrhagic conversion of dominant RIGHT hepatic lobe mass, previously characterized as adenoma. Small focus of enhancement medial to the mass is suspicious for active extravasation. See key image. 2. Trace LEFT perirenal and anterior pararenal space fat stranding is nonspecific. Early pyelonephritis can appear similar. Correlate with urinalysis. 3. Bilateral 8 mm part-solid pulmonary nodules. Per Fleischner Society Guidelines, recommend a non-contrast Chest CT at 3-6 months. These guidelines do not apply to immunocompromised patients and patients with cancer. Reference: Radiology. 2017; 284(1):228-43. These results were called by telephone at the time of interpretation on 02/06/2023 at 3:46 pm to provider [facility] , who verbally acknowledged these results. Electronically Signed   By: Michaelle Birks M.D.   On: 02/06/2023 15:51   DG Chest 1 View  Result Date: 02/06/2023 CLINICAL DATA:  Sepsis EXAM: CHEST  1 VIEW COMPARISON:  01/06/2019 FINDINGS: Heart and mediastinal contours are within normal limits. No focal opacities or effusions. No acute bony abnormality. IMPRESSION: No active disease. Electronically Signed   By: Rolm Baptise M.D.   On: 02/06/2023 00:37   DG Abd 2 Views  Result Date: 02/05/2023 CLINICAL DATA:  Small bowel obstruction. EXAM: ABDOMEN - 2 VIEW COMPARISON:  February 04, 2023.  FINDINGS: Distal tip of nasogastric tube is seen in expected position of distal stomach. Residual contrast is noted in nondilated colon. No small bowel dilatation is noted. Calcified uterine fibroid is noted in the pelvis. IMPRESSION: No abnormal bowel dilatation. Electronically Signed   By: Marijo Conception M.D.   On: 02/05/2023 08:40   DG Abd Portable 1V-Small Bowel Obstruction Protocol-initial, 8 hr delay  Result Date: 02/04/2023 CLINICAL DATA:  Small-bowel obstruction EXAM: PORTABLE ABDOMEN - 1 VIEW COMPARISON:  Earlier films of the same day FINDINGS: Nasogastric tube extends to the gastric antrum. There is hyperdense material in the nondilated stomach and in multiple loops of small bowel which are mildly distended but nondilated. Colon remains decompressed. Peripherally calcified uterine fibroid in the left pelvis. Regional bones unremarkable. IMPRESSION: 1. Nonobstructive bowel gas pattern. 2. Nasogastric tube in the gastric antrum. Electronically Signed   By: Lucrezia Europe M.D.   On: 02/04/2023 20:02   DG Abd 2 Views  Result Date: 02/04/2023 CLINICAL DATA:  Small-bowel obstruction. EXAM: ABDOMEN - 2 VIEW COMPARISON:  Abdomen 02/03/2023.  CT 02/02/2023. FINDINGS: NG tube in stable position. No bowel distention noted.  Stool is noted the colon. No free air noted. Surgical sutures and dystrophic calcifications again noted over the upper abdomen. Calcified fibroid again noted. Pelvic calcifications consistent phleboliths. No acute bony abnormality. IMPRESSION: NG tube noted stable position. No bowel distention or free air. Exam stable from prior exam. Electronically Signed   By: Marcello Moores  Register M.D.   On: 02/04/2023 05:44    Labs:  CBC: Recent Labs    02/06/23 1615 02/06/23 2204 02/07/23 0505 02/07/23 0942  WBC 15.6* 13.9* 13.7* 15.1*  HGB 9.3* 9.2* 8.8* 8.9*  HCT 29.6* 29.4* 27.7* 29.3*  PLT 260 251 234 269    COAGS: Recent Labs    02/06/23 0106  INR 1.4*  APTT 34    BMP: Recent Labs     02/04/23 0416 02/05/23 0451 02/06/23 0106 02/07/23 0505  NA 139 141 136 137  K 3.6 3.2* 3.3* 3.0*  CL 104 107 105 101  CO2 25 24 22 22  $ GLUCOSE 104* 114* 102* 83  BUN 22* 21* 12 8  CALCIUM 8.8* 8.5* 8.0* 7.9*  CREATININE 0.73 0.57 0.55 0.51  GFRNONAA >60 >60 >60 >60    LIVER FUNCTION TESTS: Recent Labs    02/02/23 1254 02/03/23 0419 02/06/23 0106  BILITOT 0.9 1.0 1.4*  AST 21 44* 119*  ALT 19 35 281*  ALKPHOS 263* 230* 231*  PROT 7.6 7.4 6.3*  ALBUMIN 3.6 3.5 2.6*    Assessment and Plan: 57 yo female with hx multifocal hepatic adenomas with previous resection ;now with spontaneous hemorrhage from dominant rt hepatic hemangioma, s/p successful selective catheter directed gelfoam embo of rt hepatic hemangioma/rt hep artery branch 2/8; rt groin access site ok; temp 99.4, WBC 15.1(13.7), hgb 8.9(8.8), K 3.0- replace; creat nl; cont current tx, hydration, close lab monitoring; abd film today:  1. No dilated small bowel identified. Contrast medium in the colon. 2. Calcified uterine fibroid. 3. Contrast medium in the urinary bladder.  Will plan IR clinic f/u with pt in 1 month to see Dr. Maryelizabeth Kaufmann; check baseline AFP now; further plans as outlined by [facility]  Electronically Signed: D. Rowe Robert, PA-C 02/07/2023, 10:39 AM   I spent a total of 15 Minutes at the the patient's bedside AND on the patient's hospital floor or unit, greater than 50% of which was counseling/coordinating care for right hepatic hemangioma/right hepatic artery branch embolization   Patient ID: Jodi Forbes, female   DOB: 12-30-1966, 57 y.o.   MRN: [id number]

## 2023-02-08 DIAGNOSIS — R748 Abnormal levels of other serum enzymes: Secondary | ICD-10-CM | POA: Diagnosis not present

## 2023-02-08 DIAGNOSIS — K7689 Other specified diseases of liver: Secondary | ICD-10-CM | POA: Diagnosis not present

## 2023-02-08 DIAGNOSIS — K56600 Partial intestinal obstruction, unspecified as to cause: Secondary | ICD-10-CM | POA: Diagnosis not present

## 2023-02-08 DIAGNOSIS — K5669 Other partial intestinal obstruction: Secondary | ICD-10-CM | POA: Diagnosis not present

## 2023-02-08 DIAGNOSIS — D134 Benign neoplasm of liver: Secondary | ICD-10-CM | POA: Diagnosis not present

## 2023-02-08 DIAGNOSIS — R509 Fever, unspecified: Secondary | ICD-10-CM | POA: Insufficient documentation

## 2023-02-08 LAB — COMPREHENSIVE METABOLIC PANEL
ALT: 168 U/L — ABNORMAL HIGH (ref 0–44)
AST: 145 U/L — ABNORMAL HIGH (ref 15–41)
Albumin: 2.2 g/dL — ABNORMAL LOW (ref 3.5–5.0)
Alkaline Phosphatase: 172 U/L — ABNORMAL HIGH (ref 38–126)
Anion gap: 12 (ref 5–15)
BUN: 5 mg/dL — ABNORMAL LOW (ref 6–20)
CO2: 20 mmol/L — ABNORMAL LOW (ref 22–32)
Calcium: 7.7 mg/dL — ABNORMAL LOW (ref 8.9–10.3)
Chloride: 102 mmol/L (ref 98–111)
Creatinine, Ser: 0.51 mg/dL (ref 0.44–1.00)
GFR, Estimated: >60 mL/min (ref >60)
Glucose, Bld: 100 mg/dL — ABNORMAL HIGH (ref 70–99)
Potassium: 3.9 mmol/L (ref 3.5–5.1)
Sodium: 134 mmol/L — ABNORMAL LOW (ref 135–145)
Total Bilirubin: 1 mg/dL (ref 0.3–1.2)
Total Protein: 6 g/dL — ABNORMAL LOW (ref 6.5–8.1)

## 2023-02-08 LAB — AFP TUMOR MARKER: AFP, Serum, Tumor Marker: <1.8 ng/mL (ref 0.0–9.2)

## 2023-02-08 LAB — CBC
HCT: 27.4 % — ABNORMAL LOW (ref 36.0–46.0)
Hemoglobin: 8.7 g/dL — ABNORMAL LOW (ref 12.0–15.0)
MCH: 29.3 pg (ref 26.0–34.0)
MCHC: 31.8 g/dL (ref 30.0–36.0)
MCV: 92.3 fL (ref 80.0–100.0)
Platelets: 259 10*3/uL (ref 150–400)
RBC: 2.97 MIL/uL — ABNORMAL LOW (ref 3.87–5.11)
RDW: 12.3 % (ref 11.5–15.5)
WBC: 14.7 10*3/uL — ABNORMAL HIGH (ref 4.0–10.5)
nRBC: 0 % (ref 0.0–0.2)

## 2023-02-08 LAB — PROCALCITONIN: Procalcitonin: 1.53 ng/mL

## 2023-02-08 LAB — MAGNESIUM: Magnesium: 1.9 mg/dL (ref 1.7–2.4)

## 2023-02-08 MED ORDER — OXYCODONE HCL 5 MG PO TABS
5.0000 mg | ORAL_TABLET | ORAL | Status: DC | PRN
  Administered 2023-02-08: 5 mg via ORAL
  Filled 2023-02-08: qty 1

## 2023-02-08 MED ORDER — POTASSIUM CHLORIDE 20 MEQ PO PACK
20.0000 meq | PACK | Freq: Once | ORAL | Status: AC
  Administered 2023-02-08: 20 meq via ORAL
  Filled 2023-02-08: qty 1

## 2023-02-08 MED ORDER — SODIUM CHLORIDE 0.9 % IV SOLN: INTRAVENOUS | Status: DC

## 2023-02-08 NOTE — Progress Notes (Signed)
PROGRESS NOTE    Jodi Forbes  [id number] DOB: February 17, 1966 DOA: 02/02/2023 PCP: Patient, No Pcp Per    Brief Narrative:   Jodi Forbes is a 57 years old female with past medical history of hepatic adenoma status postsurgical resections in the past at [facility], presented to hospital with sharp abdominal pain with some nausea and vomiting.  Symptoms did not improve so she decided to come to the hospital.  In the ED, patient had generalized abdominal tenderness.  Initial blood pressure was elevated.  CBC showed a WBC of 10.9.  BMP showed creatinine at 0.6.  Lipase of 24.  CT scan of the abdomen and pelvis was done which showed air and fluid filled loops of small bowel in the central left hemiabdomen with appropriate transition with multiple large hypodense liver lesions largest one up to 12.8 x 9.9 cm.  General surgery was consulted and patient was admitted hospital for further evaluation and treatment.  During hospitalization, small bowel obstruction resolved but patient continued to have fever so CT scan of the chest abdomen and pelvis was done which showed intra tumoral hemorrhages of the hepatic adenoma and patient underwent IR guided hepatic artery embolization.  Assessment and Plan:   Partial small bowel obstruction. General surgery on board.    Status post Gastrografin study with contrast in the colon..  Clinically improving.  Patient has been advanced to full liquids today.  Hepatic adenoma with  intratumoral hemorrhage with ongoing pain..   Patient had CT scan of the chest abdomen and pelvis yesterday to see any source of the infection since she was having fever but was noted to have a intra tumoral hemorrhage in the hepatic adenoma.  Status post IR guided embolization for acute extravasation from large right hepatic lobe mass.  Will continue to monitor hemoglobin levels.  Hemoglobin today at 8.7 from 8.8.  Patient still complains of a 7/ 10 pain.  Seen by general surgery.   Will add her oral oxycodone for pain management.  History of hepatic adenomas Elevated LFT likely from hepatic adenomas.  Largest one at 12.8 x 9.9 cm.  Patient stated that she did have multiple hepatic adenoma resections in the past at [facility] in [place].  Continue analgesia and focus on pain relief.  Follow surgical recommendation.  AFP is less than 1.8.  New fever with leukocytosis.   Temperature max of 101.6 F in the last 24 hours.  Leukocytosis with  WBC at 14.7 from 15.1 <16.9  initial WBC at 10.9.  Urinalysis negative.   Initial chest x-ray showed some patchy opacities over the right lower lung but a repeat chest x-ray negative for acute infiltrate.  CT chest without any infiltrate.  Continue incentive spirometry, procalcitonin was 0.8.  Blood cultures negative in 1 day. on Vanco, cefepime and Flagyl for possible intra-abdominal source of infection so will discontinue vancomycin at this time.  Still has high-grade fever so we will continue cefepime and Flagyl.  Blood cultures negative in 2 days. CT scan of the chest abdomen pelvis without obvious source of infection except for hepatic adenoma with intratumoral hemorrhage.  This could be the source of fever as well.  MRSA PCR positive.  COVID influenza and RSV negative..   Lactate was 0.7.  Urinalysis with 6-10 white cells only.    Hypokalemia will replenish orally and through IV.  Potassium level at 3.9 today.  Will continue to replenish.   DVT prophylaxis: SCDs Start: 02/02/23 1703    Code Status:  Code Status: Full Code  Disposition:  Likely home 1 to 2 days pending upon clinical improvement and surgical input.  Status is: Inpatient  Remains inpatient appropriate because: Resolving small bowel obstruction, IV fluids,  Fever with leukocytosis,, status post IR embolization, pending clinical improvement.   Family Communication:  Spoke with the patient's mother at bedside on 02/08/2023  Consultants:  General  surgery Interventional radiology  Procedures:  NG tube placement and removal Interventional radiology - celiac and right hepatic arteriography with embolization on 02/06/2023.  Antimicrobials:  Vancomycin, cefepime and Flagyl. Will dc vancomycin  Anti-infectives (From admission, onward)    Start     Dose/Rate Route Frequency Ordered Stop   02/07/23 0200  vancomycin (VANCOREADY) IVPB 1250 mg/250 mL  Status:  Discontinued        1,250 mg 166.7 mL/hr over 90 Minutes Intravenous Every 24 hours 02/06/23 0334 02/08/23 0825   02/06/23 1928  piperacillin-tazobactam (ZOSYN) IVPB        over 240 Minutes  Continuous PRN 02/06/23 1932 02/06/23 1928   02/06/23 0800  ceFEPIme (MAXIPIME) 2 g in sodium chloride 0.9 % 100 mL IVPB        2 g 200 mL/hr over 30 Minutes Intravenous Every 8 hours 02/06/23 0103     02/06/23 0100  ceFEPIme (MAXIPIME) 2 g in sodium chloride 0.9 % 100 mL IVPB        2 g 200 mL/hr over 30 Minutes Intravenous  Once 02/06/23 0006 02/06/23 0051   02/06/23 0100  metroNIDAZOLE (FLAGYL) IVPB 500 mg        500 mg 100 mL/hr over 60 Minutes Intravenous Every 12 hours 02/06/23 0006 02/13/23 0059   02/06/23 0100  vancomycin (VANCOCIN) IVPB 1000 mg/200 mL premix  Status:  Discontinued        1,000 mg 200 mL/hr over 60 Minutes Intravenous  Once 02/06/23 0006 02/06/23 0011   02/06/23 0100  vancomycin (VANCOREADY) IVPB 1750 mg/350 mL        1,750 mg 175 mL/hr over 120 Minutes Intravenous  Once 02/06/23 0011 02/06/23 0443      Subjective:  Today, patient was seen and examined at bedside.  Still complains of a 7 x 10 pain.  Had some nausea.  Surgery has seen the patient and advance to full liquids.  Still has fever over the last 24 hours at 101.6 F.  Objective: Vitals:   02/08/23 0200 02/08/23 0406 02/08/23 0422 02/08/23 0800  BP: (!) 144/68 (!) 142/80  (!) 147/82  Pulse: (!) 107 (!) 104    Resp: 20 13  20  $ Temp:   100.3 F (37.9 C) (!) 100.4 F (38 C)  TempSrc:   Oral Axillary   SpO2: 95% 96%  96%  Weight:      Height:        Intake/Output Summary (Last 24 hours) at 02/08/2023 1028 Last data filed at 02/08/2023 0352 Gross per 24 hour  Intake 2540.44 ml  Output 1850 ml  Net 690.44 ml    Filed Weights   02/02/23 1253 02/06/23 2019  Weight: 86.2 kg 83.1 kg    Physical Examination: Body mass index is 31.45 kg/m.   General: Alert awake and Communicative, obese built, not in obvious distress  HENT:   No scleral pallor or icterus noted. Oral mucosa is moist.  Chest:  Clear breath sounds.  Diminished breath sounds bilaterally. No crackles or wheezes.  CVS: S1 &S2 heard. No murmur.  Regular rate and rhythm. Abdomen: Soft, epigastric tenderness  on palpation, no rebound tenderness extremities: No cyanosis, clubbing or edema.  Peripheral pulses are palpable. Psych: Alert, awake and oriented, normal mood CNS:  No cranial nerve deficits.  Power equal in all extremities.   Skin: Warm and dry.  No rashes noted.  Data Reviewed:   CBC: Recent Labs  Lab 02/06/23 0106 02/06/23 1615 02/06/23 2204 02/07/23 0505 02/07/23 0942 02/07/23 1539 02/08/23 0316  WBC 16.9*   < > 13.9* 13.7* 15.1* 14.7* 14.7*  NEUTROABS 13.0*  --   --   --   --   --   --   HGB 9.6*   < > 9.2* 8.8* 8.9* 9.2* 8.7*  HCT 30.7*   < > 29.4* 27.7* 29.3* 28.6* 27.4*  MCV 94.2   < > 93.6 92.3 96.4 90.2 92.3  PLT 270   < > 251 234 269 244 259   < > = values in this interval not displayed.     Basic Metabolic Panel: Recent Labs  Lab 02/04/23 0416 02/05/23 0451 02/06/23 0106 02/06/23 0118 02/07/23 0505 02/08/23 0316  NA 139 141 136  --  137 134*  K 3.6 3.2* 3.3*  --  3.0* 3.9  CL 104 107 105  --  101 102  CO2 25 24 22  $ --  22 20*  GLUCOSE 104* 114* 102*  --  83 100*  BUN 22* 21* 12  --  8 5*  CREATININE 0.73 0.57 0.55  --  0.51 0.51  CALCIUM 8.8* 8.5* 8.0*  --  7.9* 7.7*  MG 2.0 2.3  --  1.7 1.9 1.9     Liver Function Tests: Recent Labs  Lab 02/02/23 1254 02/03/23 0419  02/06/23 0106 02/08/23 0316  AST 21 44* 119* 145*  ALT 19 35 281* 168*  ALKPHOS 263* 230* 231* 172*  BILITOT 0.9 1.0 1.4* 1.0  PROT 7.6 7.4 6.3* 6.0*  ALBUMIN 3.6 3.5 2.6* 2.2*      Radiology Studies: IR Angiogram Selective Each Additional Vessel  Result Date: 02/07/2023 INDICATION: Hemorrhagic liver mass Briefly, 57 year old female with history of multifocal hepatic adenomas with previous resections presenting with hemorrhagic dominant RIGHT hepatic lobe tumor. EXAM: Procedures: 1. CELIAC and HEPATIC ARTERIOGRAPHY 2. GEL FOAM EMBOLIZATION of HEMORRHAGIC RIGHT HEPATIC LOBE MASS COMPARISON:  CT CAP, earlier same day. MEDICATIONS: 3.375 Zosyn IV. The antibiotic was administered within 1 hour of the procedure. 4 mg Zofran IV. ANESTHESIA/SEDATION: Moderate (conscious) sedation was employed during this procedure. A total of Versed 2 mg and Fentanyl 100 mcg was administered intravenously. Moderate Sedation Time: 71 minutes. The patient's level of consciousness and vital signs were monitored continuously by radiology nursing throughout the procedure under my direct supervision. CONTRAST:  50 mL Omnipaque 300 FLUOROSCOPY TIME:  Fluoroscopic dose; [id number] mGy COMPLICATIONS: None immediate. PROCEDURE: Informed consent was obtained from the patient and/or patient's representative following explanation of the procedure, risks, benefits and alternatives. All questions were addressed. A time out was performed prior to the initiation of the procedure. Maximal barrier sterile technique utilized including caps, mask, sterile gowns, sterile gloves, large sterile drape, hand hygiene, and chlorhexidine prep. At the RIGHT groin 1% lidocaine was used for local anesthesia. Limited ultrasound imaging of the groin shows the RIGHT femoral artery to be patent. An ultrasound image was saved and sent to PACS. Arterial access was obtained with a 21-G, 7-cm needle under direct ultrasound guidance, through which a 0.018-inch guidewire  was advanced under fluoroscopy. The 21-G needle was then exchanged for a micropuncture catheter  and a 0.035-inch Bentson guidewire. The micropuncture catheter was exchanged for a 5 Fr sheath. Over the wire, a 5 Fr Cobra C2 catheter was positioned within the proximal abdominal aorta and used to select the celiac axis. Once catheterized, a digital subtraction angiogram (DSA) was obtained. A 2.8 Fr 110 cm Progreat microcatheter and 0.016 inch Fathom microwire was used to gain access to the common then proper then LEFT and RIGHT arteries. Selective DSAs were performed at each level. Finally the target vessel branching off the RIGHT hepatic artery was isolated and a subselective DSA was performed. PURPOSE OF THE ARTERIOGRAM: No previous catheter-directed angiogram was available. Therefore a new complete diagnostic angiography was performed. The decision to proceed with an interventional procedure was made based on this new diagnostic angiogram. Selective branch RIGHT hepatic artery embolization was then performed using Gelfoam slurry. A final angiogram was performed. Images were reviewed and the procedure was terminated. All wires, catheters and sheaths were removed from the patient. Hemostasis was achieved at the RIGHT groin access site with Angio-Seal closure. The patient tolerated the procedure well without immediate post procedural complication. FINDINGS: *Hepatic arteriography with active extravasation at the large RIGHT hepatic lobe mass, originating off a cephalad-projecting branch. *Successful selective Gelfoam slurry embolization of a branch RIGHT hepatic artery, as above. IMPRESSION: Active extravasation from large RIGHT hepatic lobe mass, successfully treated with selective catheter-directed Gelfoam embolization. PLAN: *Post sheath removal precautions including RIGHT lower extremity slight straight x2 hours. *Serial abdominal examinations and H/h trend to evaluate potential ongoing hepatic bleeding. Michaelle Birks, MD [facility] Radiology Electronically Signed   By: Michaelle Birks M.D.   On: 02/07/2023 11:40   DG Abd 1 View  Result Date: 02/07/2023 CLINICAL DATA:  Ileus.  Liver embolization yesterday. EXAM: ABDOMEN - 1 VIEW COMPARISON:  CT abdomen 02/06/2023 FINDINGS: Contrast medium noted in the colon. No dilated small bowel identified, a left abdominal small bowel loop measures 2.6 cm in diameter. Calcified uterine fibroid noted. Stable high density material lining the inferior margin of the right hepatic lobe likely residual from prior resections. Contrast medium in the urinary bladder. IMPRESSION: 1. No dilated small bowel identified. Contrast medium in the colon. 2. Calcified uterine fibroid. 3. Contrast medium in the urinary bladder. Electronically Signed   By: Van Clines M.D.   On: 02/07/2023 10:05   IR US Guide Vasc Access Right  Result Date: 02/06/2023 INDICATION: Hemorrhagic liver mass Briefly, 56 year old female with history of multifocal hepatic adenomas with previous resections presenting with hemorrhagic dominant RIGHT hepatic lobe tumor. EXAM: Procedures: 1. CELIAC and HEPATIC ARTERIOGRAPHY 2. GEL FOAM EMBOLIZATION of HEMORRHAGIC RIGHT HEPATIC LOBE MASS COMPARISON:  CT CAP, earlier same day. MEDICATIONS: 3.375 Zosyn IV. The antibiotic was administered within 1 hour of the procedure. 4 mg Zofran IV. ANESTHESIA/SEDATION: Moderate (conscious) sedation was employed during this procedure. A total of Versed 2 mg and Fentanyl 100 mcg was administered intravenously. Moderate Sedation Time: 71 minutes. The patient's level of consciousness and vital signs were monitored continuously by radiology nursing throughout the procedure under my direct supervision. CONTRAST:  50 mL Omnipaque 300 FLUOROSCOPY TIME:  Fluoroscopic dose; [id number] mGy COMPLICATIONS: None immediate. PROCEDURE: Informed consent was obtained from the patient and/or patient's representative  following explanation of the procedure, risks, benefits and alternatives. All questions were addressed. A time out was performed prior to the initiation of the procedure. Maximal barrier sterile technique utilized including caps, mask, sterile gowns, sterile gloves, large sterile drape, hand hygiene,  and chlorhexidine prep. At the RIGHT groin 1% lidocaine was used for local anesthesia. Limited ultrasound imaging of the groin shows the RIGHT femoral artery to be patent. An ultrasound image was saved and sent to PACS. Arterial access was obtained with a 21-G, 7-cm needle under direct ultrasound guidance, through which a 0.018-inch guidewire was advanced under fluoroscopy. The 21-G needle was then exchanged for a micropuncture catheter and a 0.035-inch Bentson guidewire. The micropuncture catheter was exchanged for a 5 Fr sheath. Over the wire, a 5 Fr Cobra C2 catheter was positioned within the proximal abdominal aorta and used to select the celiac axis. Once catheterized, a digital subtraction angiogram (DSA) was obtained. A 2.8 Fr 110 cm Progreat microcatheter and 0.016 inch Fathom microwire was used to gain access to the common then proper then LEFT and RIGHT arteries. Selective DSAs were performed at each level. Finally the target vessel branching off the RIGHT hepatic artery was isolated and a subselective DSA was performed. PURPOSE OF THE ARTERIOGRAM: No previous catheter-directed angiogram was available. Therefore a new complete diagnostic angiography was performed. The decision to proceed with an interventional procedure was made based on this new diagnostic angiogram. Selective branch RIGHT hepatic artery embolization was then performed using Gelfoam slurry. A final angiogram was performed. Images were reviewed and the procedure was terminated. All wires, catheters and sheaths were removed from the patient. Hemostasis was achieved at the RIGHT groin access site with Angio-Seal closure. The patient tolerated  the procedure well without immediate post procedural complication. FINDINGS: *Hepatic arteriography with active extravasation at the large RIGHT hepatic lobe mass, originating off a cephalad-projecting branch. *Successful selective Gelfoam slurry embolization of a branch RIGHT hepatic artery, as above. IMPRESSION: Active extravasation from large RIGHT hepatic lobe mass, successfully treated with selective catheter-directed Gelfoam embolization. PLAN: *Post sheath removal precautions including RIGHT lower extremity slight straight x2 hours. *Serial abdominal examinations and H/h trend to evaluate potential ongoing hepatic bleeding. Michaelle Birks, MD [facility] Radiology Electronically Signed   By: Michaelle Birks M.D.   On: 02/06/2023 20:57   IR EMBO ART  VEN HEMORR LYMPH EXTRAV  INC GUIDE ROADMAPPING  Result Date: 02/06/2023 INDICATION: Hemorrhagic liver mass Briefly, 57 year old female with history of multifocal hepatic adenomas with previous resections presenting with hemorrhagic dominant RIGHT hepatic lobe tumor. EXAM: Procedures: 1. CELIAC and HEPATIC ARTERIOGRAPHY 2. GEL FOAM EMBOLIZATION of HEMORRHAGIC RIGHT HEPATIC LOBE MASS COMPARISON:  CT CAP, earlier same day. MEDICATIONS: 3.375 Zosyn IV. The antibiotic was administered within 1 hour of the procedure. 4 mg Zofran IV. ANESTHESIA/SEDATION: Moderate (conscious) sedation was employed during this procedure. A total of Versed 2 mg and Fentanyl 100 mcg was administered intravenously. Moderate Sedation Time: 71 minutes. The patient's level of consciousness and vital signs were monitored continuously by radiology nursing throughout the procedure under my direct supervision. CONTRAST:  50 mL Omnipaque 300 FLUOROSCOPY TIME:  Fluoroscopic dose; [id number] mGy COMPLICATIONS: None immediate. PROCEDURE: Informed consent was obtained from the patient and/or patient's representative following explanation of the procedure, risks,  benefits and alternatives. All questions were addressed. A time out was performed prior to the initiation of the procedure. Maximal barrier sterile technique utilized including caps, mask, sterile gowns, sterile gloves, large sterile drape, hand hygiene, and chlorhexidine prep. At the RIGHT groin 1% lidocaine was used for local anesthesia. Limited ultrasound imaging of the groin shows the RIGHT femoral artery to be patent. An ultrasound image was saved and sent to PACS. Arterial access was obtained  with a 21-G, 7-cm needle under direct ultrasound guidance, through which a 0.018-inch guidewire was advanced under fluoroscopy. The 21-G needle was then exchanged for a micropuncture catheter and a 0.035-inch Bentson guidewire. The micropuncture catheter was exchanged for a 5 Fr sheath. Over the wire, a 5 Fr Cobra C2 catheter was positioned within the proximal abdominal aorta and used to select the celiac axis. Once catheterized, a digital subtraction angiogram (DSA) was obtained. A 2.8 Fr 110 cm Progreat microcatheter and 0.016 inch Fathom microwire was used to gain access to the common then proper then LEFT and RIGHT arteries. Selective DSAs were performed at each level. Finally the target vessel branching off the RIGHT hepatic artery was isolated and a subselective DSA was performed. PURPOSE OF THE ARTERIOGRAM: No previous catheter-directed angiogram was available. Therefore a new complete diagnostic angiography was performed. The decision to proceed with an interventional procedure was made based on this new diagnostic angiogram. Selective branch RIGHT hepatic artery embolization was then performed using Gelfoam slurry. A final angiogram was performed. Images were reviewed and the procedure was terminated. All wires, catheters and sheaths were removed from the patient. Hemostasis was achieved at the RIGHT groin access site with Angio-Seal closure. The patient tolerated the procedure well without immediate post  procedural complication. FINDINGS: *Hepatic arteriography with active extravasation at the large RIGHT hepatic lobe mass, originating off a cephalad-projecting branch. *Successful selective Gelfoam slurry embolization of a branch RIGHT hepatic artery, as above. IMPRESSION: Active extravasation from large RIGHT hepatic lobe mass, successfully treated with selective catheter-directed Gelfoam embolization. PLAN: *Post sheath removal precautions including RIGHT lower extremity slight straight x2 hours. *Serial abdominal examinations and H/h trend to evaluate potential ongoing hepatic bleeding. Michaelle Birks, MD [facility] Radiology Electronically Signed   By: Michaelle Birks M.D.   On: 02/06/2023 20:57   IR Angiogram Visceral Selective  Result Date: 02/06/2023 INDICATION: Hemorrhagic liver mass Briefly, 57 year old female with history of multifocal hepatic adenomas with previous resections presenting with hemorrhagic dominant RIGHT hepatic lobe tumor. EXAM: Procedures: 1. CELIAC and HEPATIC ARTERIOGRAPHY 2. GEL FOAM EMBOLIZATION of HEMORRHAGIC RIGHT HEPATIC LOBE MASS COMPARISON:  CT CAP, earlier same day. MEDICATIONS: 3.375 Zosyn IV. The antibiotic was administered within 1 hour of the procedure. 4 mg Zofran IV. ANESTHESIA/SEDATION: Moderate (conscious) sedation was employed during this procedure. A total of Versed 2 mg and Fentanyl 100 mcg was administered intravenously. Moderate Sedation Time: 71 minutes. The patient's level of consciousness and vital signs were monitored continuously by radiology nursing throughout the procedure under my direct supervision. CONTRAST:  50 mL Omnipaque 300 FLUOROSCOPY TIME:  Fluoroscopic dose; [id number] mGy COMPLICATIONS: None immediate. PROCEDURE: Informed consent was obtained from the patient and/or patient's representative following explanation of the procedure, risks, benefits and alternatives. All questions were addressed. A time out was  performed prior to the initiation of the procedure. Maximal barrier sterile technique utilized including caps, mask, sterile gowns, sterile gloves, large sterile drape, hand hygiene, and chlorhexidine prep. At the RIGHT groin 1% lidocaine was used for local anesthesia. Limited ultrasound imaging of the groin shows the RIGHT femoral artery to be patent. An ultrasound image was saved and sent to PACS. Arterial access was obtained with a 21-G, 7-cm needle under direct ultrasound guidance, through which a 0.018-inch guidewire was advanced under fluoroscopy. The 21-G needle was then exchanged for a micropuncture catheter and a 0.035-inch Bentson guidewire. The micropuncture catheter was exchanged for a 5 Fr sheath. Over the wire, a 5 Fr  Cobra C2 catheter was positioned within the proximal abdominal aorta and used to select the celiac axis. Once catheterized, a digital subtraction angiogram (DSA) was obtained. A 2.8 Fr 110 cm Progreat microcatheter and 0.016 inch Fathom microwire was used to gain access to the common then proper then LEFT and RIGHT arteries. Selective DSAs were performed at each level. Finally the target vessel branching off the RIGHT hepatic artery was isolated and a subselective DSA was performed. PURPOSE OF THE ARTERIOGRAM: No previous catheter-directed angiogram was available. Therefore a new complete diagnostic angiography was performed. The decision to proceed with an interventional procedure was made based on this new diagnostic angiogram. Selective branch RIGHT hepatic artery embolization was then performed using Gelfoam slurry. A final angiogram was performed. Images were reviewed and the procedure was terminated. All wires, catheters and sheaths were removed from the patient. Hemostasis was achieved at the RIGHT groin access site with Angio-Seal closure. The patient tolerated the procedure well without immediate post procedural complication. FINDINGS: *Hepatic arteriography with active  extravasation at the large RIGHT hepatic lobe mass, originating off a cephalad-projecting branch. *Successful selective Gelfoam slurry embolization of a branch RIGHT hepatic artery, as above. IMPRESSION: Active extravasation from large RIGHT hepatic lobe mass, successfully treated with selective catheter-directed Gelfoam embolization. PLAN: *Post sheath removal precautions including RIGHT lower extremity slight straight x2 hours. *Serial abdominal examinations and H/h trend to evaluate potential ongoing hepatic bleeding. Michaelle Birks, MD [facility] Radiology Electronically Signed   By: Michaelle Birks M.D.   On: 02/06/2023 20:57   IR Angiogram Selective Each Additional Vessel  Result Date: 02/06/2023 INDICATION: Hemorrhagic liver mass Briefly, 57 year old female with history of multifocal hepatic adenomas with previous resections presenting with hemorrhagic dominant RIGHT hepatic lobe tumor. EXAM: Procedures: 1. CELIAC and HEPATIC ARTERIOGRAPHY 2. GEL FOAM EMBOLIZATION of HEMORRHAGIC RIGHT HEPATIC LOBE MASS COMPARISON:  CT CAP, earlier same day. MEDICATIONS: 3.375 Zosyn IV. The antibiotic was administered within 1 hour of the procedure. 4 mg Zofran IV. ANESTHESIA/SEDATION: Moderate (conscious) sedation was employed during this procedure. A total of Versed 2 mg and Fentanyl 100 mcg was administered intravenously. Moderate Sedation Time: 71 minutes. The patient's level of consciousness and vital signs were monitored continuously by radiology nursing throughout the procedure under my direct supervision. CONTRAST:  50 mL Omnipaque 300 FLUOROSCOPY TIME:  Fluoroscopic dose; [id number] mGy COMPLICATIONS: None immediate. PROCEDURE: Informed consent was obtained from the patient and/or patient's representative following explanation of the procedure, risks, benefits and alternatives. All questions were addressed. A time out was performed prior to the initiation of the procedure.  Maximal barrier sterile technique utilized including caps, mask, sterile gowns, sterile gloves, large sterile drape, hand hygiene, and chlorhexidine prep. At the RIGHT groin 1% lidocaine was used for local anesthesia. Limited ultrasound imaging of the groin shows the RIGHT femoral artery to be patent. An ultrasound image was saved and sent to PACS. Arterial access was obtained with a 21-G, 7-cm needle under direct ultrasound guidance, through which a 0.018-inch guidewire was advanced under fluoroscopy. The 21-G needle was then exchanged for a micropuncture catheter and a 0.035-inch Bentson guidewire. The micropuncture catheter was exchanged for a 5 Fr sheath. Over the wire, a 5 Fr Cobra C2 catheter was positioned within the proximal abdominal aorta and used to select the celiac axis. Once catheterized, a digital subtraction angiogram (DSA) was obtained. A 2.8 Fr 110 cm Progreat microcatheter and 0.016 inch Fathom microwire was used to gain access to the common then  proper then LEFT and RIGHT arteries. Selective DSAs were performed at each level. Finally the target vessel branching off the RIGHT hepatic artery was isolated and a subselective DSA was performed. PURPOSE OF THE ARTERIOGRAM: No previous catheter-directed angiogram was available. Therefore a new complete diagnostic angiography was performed. The decision to proceed with an interventional procedure was made based on this new diagnostic angiogram. Selective branch RIGHT hepatic artery embolization was then performed using Gelfoam slurry. A final angiogram was performed. Images were reviewed and the procedure was terminated. All wires, catheters and sheaths were removed from the patient. Hemostasis was achieved at the RIGHT groin access site with Angio-Seal closure. The patient tolerated the procedure well without immediate post procedural complication. FINDINGS: *Hepatic arteriography with active extravasation at the large RIGHT hepatic lobe mass,  originating off a cephalad-projecting branch. *Successful selective Gelfoam slurry embolization of a branch RIGHT hepatic artery, as above. IMPRESSION: Active extravasation from large RIGHT hepatic lobe mass, successfully treated with selective catheter-directed Gelfoam embolization. PLAN: *Post sheath removal precautions including RIGHT lower extremity slight straight x2 hours. *Serial abdominal examinations and H/h trend to evaluate potential ongoing hepatic bleeding. Michaelle Birks, MD [facility] Radiology Electronically Signed   By: Michaelle Birks M.D.   On: 02/06/2023 20:57   IR Angiogram Selective Each Additional Vessel  Result Date: 02/06/2023 INDICATION: Hemorrhagic liver mass Briefly, 57 year old female with history of multifocal hepatic adenomas with previous resections presenting with hemorrhagic dominant RIGHT hepatic lobe tumor. EXAM: Procedures: 1. CELIAC and HEPATIC ARTERIOGRAPHY 2. GEL FOAM EMBOLIZATION of HEMORRHAGIC RIGHT HEPATIC LOBE MASS COMPARISON:  CT CAP, earlier same day. MEDICATIONS: 3.375 Zosyn IV. The antibiotic was administered within 1 hour of the procedure. 4 mg Zofran IV. ANESTHESIA/SEDATION: Moderate (conscious) sedation was employed during this procedure. A total of Versed 2 mg and Fentanyl 100 mcg was administered intravenously. Moderate Sedation Time: 71 minutes. The patient's level of consciousness and vital signs were monitored continuously by radiology nursing throughout the procedure under my direct supervision. CONTRAST:  50 mL Omnipaque 300 FLUOROSCOPY TIME:  Fluoroscopic dose; [id number] mGy COMPLICATIONS: None immediate. PROCEDURE: Informed consent was obtained from the patient and/or patient's representative following explanation of the procedure, risks, benefits and alternatives. All questions were addressed. A time out was performed prior to the initiation of the procedure. Maximal barrier sterile technique utilized including  caps, mask, sterile gowns, sterile gloves, large sterile drape, hand hygiene, and chlorhexidine prep. At the RIGHT groin 1% lidocaine was used for local anesthesia. Limited ultrasound imaging of the groin shows the RIGHT femoral artery to be patent. An ultrasound image was saved and sent to PACS. Arterial access was obtained with a 21-G, 7-cm needle under direct ultrasound guidance, through which a 0.018-inch guidewire was advanced under fluoroscopy. The 21-G needle was then exchanged for a micropuncture catheter and a 0.035-inch Bentson guidewire. The micropuncture catheter was exchanged for a 5 Fr sheath. Over the wire, a 5 Fr Cobra C2 catheter was positioned within the proximal abdominal aorta and used to select the celiac axis. Once catheterized, a digital subtraction angiogram (DSA) was obtained. A 2.8 Fr 110 cm Progreat microcatheter and 0.016 inch Fathom microwire was used to gain access to the common then proper then LEFT and RIGHT arteries. Selective DSAs were performed at each level. Finally the target vessel branching off the RIGHT hepatic artery was isolated and a subselective DSA was performed. PURPOSE OF THE ARTERIOGRAM: No previous catheter-directed angiogram was available. Therefore a new complete diagnostic angiography  was performed. The decision to proceed with an interventional procedure was made based on this new diagnostic angiogram. Selective branch RIGHT hepatic artery embolization was then performed using Gelfoam slurry. A final angiogram was performed. Images were reviewed and the procedure was terminated. All wires, catheters and sheaths were removed from the patient. Hemostasis was achieved at the RIGHT groin access site with Angio-Seal closure. The patient tolerated the procedure well without immediate post procedural complication. FINDINGS: *Hepatic arteriography with active extravasation at the large RIGHT hepatic lobe mass, originating off a cephalad-projecting branch. *Successful  selective Gelfoam slurry embolization of a branch RIGHT hepatic artery, as above. IMPRESSION: Active extravasation from large RIGHT hepatic lobe mass, successfully treated with selective catheter-directed Gelfoam embolization. PLAN: *Post sheath removal precautions including RIGHT lower extremity slight straight x2 hours. *Serial abdominal examinations and H/h trend to evaluate potential ongoing hepatic bleeding. Michaelle Birks, MD [facility] Radiology Electronically Signed   By: Michaelle Birks M.D.   On: 02/06/2023 20:57   IR Angiogram Selective Each Additional Vessel  Result Date: 02/06/2023 INDICATION: Hemorrhagic liver mass Briefly, 57 year old female with history of multifocal hepatic adenomas with previous resections presenting with hemorrhagic dominant RIGHT hepatic lobe tumor. EXAM: Procedures: 1. CELIAC and HEPATIC ARTERIOGRAPHY 2. GEL FOAM EMBOLIZATION of HEMORRHAGIC RIGHT HEPATIC LOBE MASS COMPARISON:  CT CAP, earlier same day. MEDICATIONS: 3.375 Zosyn IV. The antibiotic was administered within 1 hour of the procedure. 4 mg Zofran IV. ANESTHESIA/SEDATION: Moderate (conscious) sedation was employed during this procedure. A total of Versed 2 mg and Fentanyl 100 mcg was administered intravenously. Moderate Sedation Time: 71 minutes. The patient's level of consciousness and vital signs were monitored continuously by radiology nursing throughout the procedure under my direct supervision. CONTRAST:  50 mL Omnipaque 300 FLUOROSCOPY TIME:  Fluoroscopic dose; [id number] mGy COMPLICATIONS: None immediate. PROCEDURE: Informed consent was obtained from the patient and/or patient's representative following explanation of the procedure, risks, benefits and alternatives. All questions were addressed. A time out was performed prior to the initiation of the procedure. Maximal barrier sterile technique utilized including caps, mask, sterile gowns, sterile gloves, large sterile  drape, hand hygiene, and chlorhexidine prep. At the RIGHT groin 1% lidocaine was used for local anesthesia. Limited ultrasound imaging of the groin shows the RIGHT femoral artery to be patent. An ultrasound image was saved and sent to PACS. Arterial access was obtained with a 21-G, 7-cm needle under direct ultrasound guidance, through which a 0.018-inch guidewire was advanced under fluoroscopy. The 21-G needle was then exchanged for a micropuncture catheter and a 0.035-inch Bentson guidewire. The micropuncture catheter was exchanged for a 5 Fr sheath. Over the wire, a 5 Fr Cobra C2 catheter was positioned within the proximal abdominal aorta and used to select the celiac axis. Once catheterized, a digital subtraction angiogram (DSA) was obtained. A 2.8 Fr 110 cm Progreat microcatheter and 0.016 inch Fathom microwire was used to gain access to the common then proper then LEFT and RIGHT arteries. Selective DSAs were performed at each level. Finally the target vessel branching off the RIGHT hepatic artery was isolated and a subselective DSA was performed. PURPOSE OF THE ARTERIOGRAM: No previous catheter-directed angiogram was available. Therefore a new complete diagnostic angiography was performed. The decision to proceed with an interventional procedure was made based on this new diagnostic angiogram. Selective branch RIGHT hepatic artery embolization was then performed using Gelfoam slurry. A final angiogram was performed. Images were reviewed and the procedure was terminated. All wires, catheters and  sheaths were removed from the patient. Hemostasis was achieved at the RIGHT groin access site with Angio-Seal closure. The patient tolerated the procedure well without immediate post procedural complication. FINDINGS: *Hepatic arteriography with active extravasation at the large RIGHT hepatic lobe mass, originating off a cephalad-projecting branch. *Successful selective Gelfoam slurry embolization of a branch RIGHT  hepatic artery, as above. IMPRESSION: Active extravasation from large RIGHT hepatic lobe mass, successfully treated with selective catheter-directed Gelfoam embolization. PLAN: *Post sheath removal precautions including RIGHT lower extremity slight straight x2 hours. *Serial abdominal examinations and H/h trend to evaluate potential ongoing hepatic bleeding. Michaelle Birks, MD [facility] Radiology Electronically Signed   By: Michaelle Birks M.D.   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 EXAM: CT CHEST, ABDOMEN, AND PELVIS WITH CONTRAST TECHNIQUE: Multidetector CT imaging of the chest, abdomen and pelvis was performed following the standard protocol during bolus administration of intravenous contrast. RADIATION DOSE REDUCTION: This exam was performed according to the departmental dose-optimization program which includes automated exposure control, adjustment of the mA and/or kV according to patient size and/or use of iterative reconstruction technique. CONTRAST:  174m OMNIPAQUE IOHEXOL 300 MG/ML  SOLN COMPARISON:  CT AP, 02/02/2023 and 03/26/2021. Chest XR, concurrent. FINDINGS: CT CHEST FINDINGS Cardiovascular: No acute intrathoracic vascular abnormality. Normal heart size. No pericardial effusion. Mediastinum/Nodes: No enlarged mediastinal, hilar, or axillary lymph nodes. Thyroid gland, trachea, and esophagus demonstrate no significant findings. Lungs/Pleura: Bilateral 8 mm subsolid pulmonary nodules, within the LEFT upper and RIGHT lower lobes. Additional 5 mm nodularity along the RIGHT major fissure is likely to represent an intrapulmonary lymph node. Lungs are otherwise clear without focal consolidation or mass. No pleural effusion or pneumothorax. Musculoskeletal: No acute chest wall mass or  suspicious bone lesion. CT ABDOMEN PELVIS FINDINGS Hepatobiliary: *Dominant RIGHT superior hepatic lobe mass appears heterogeneously dense with new prominent hypodense component. *The mass measures approximately 8.0 x 10.2 x 10.0 cm (AP by transaxial by CC), previously 9.9 x 12.8 (AP by transaxial). *Question enhancing focus medial to the mass, suspicious for angiographic spot sign and active extravasation. *Postsurgical changes of prior RIGHT and LEFT liver mass resections. Status post cholecystectomy. No biliary dilatation. Pancreas: No pancreatic ductal dilatation. Spleen: Normal in size without focal abnormality. Adrenals/Urinary Tract: Adrenal glands are unremarkable. Kidneys are normal, without renal calculi, focal lesion, or hydronephrosis. Bladder is unremarkable. Stomach/Bowel: Stomach is within normal limits. Appendix appears normal. Nonobstructed small bowel. Nondilated colon. Contrast opacification of distal small bowel and colon without extraluminal extravasation. No evidence of bowel wall thickening, distention, or inflammatory changes. Vascular/Lymphatic: No significant vascular findings are present. No enlarged abdominal or pelvic lymph nodes. Reproductive: Peripherally-calcified RIGHT fibroid. Prominent ovarian follicles. No adnexal mass. Other: Trace perirenal, peripancreatic and anterior pararenal space fat stranding. Trace body wall edema. No abdominal wall hernia or abnormality. No abdominopelvic ascites. Musculoskeletal: No acute or significant osseous findings. IMPRESSION: 1. Question of hemorrhagic conversion of dominant RIGHT hepatic lobe mass, previously characterized as adenoma. Small focus of enhancement medial to the mass is suspicious for active extravasation. See key image. 2. Trace LEFT perirenal and anterior pararenal space fat stranding is nonspecific. Early pyelonephritis can appear similar. Correlate with urinalysis. 3. Bilateral 8 mm part-solid pulmonary nodules. Per Fleischner  Society Guidelines, recommend a non-contrast Chest CT at 3-6 months. These guidelines do not apply to  immunocompromised patients and patients with cancer. Reference: Radiology. 2017; 284(1):228-43. These results were called by telephone at the time of interpretation on 02/06/2023 at 3:46 pm to provider [facility] , who verbally acknowledged these results. Electronically Signed   By: Michaelle Birks M.D.   On: 02/06/2023 15:51      LOS: 6 days    Flora Lipps, MD [facility] Available via Epic secure chat 7am-7pm After these hours, please refer to coverage provider listed on [email or web address] 02/08/2023, 10:28 AM

## 2023-02-08 NOTE — Progress Notes (Signed)
Progress Note     Subjective: Tolerated diet advance to clears.  Had several BMs.  Pt not sure that she had any flatus.  Did have fever overnight to 101.6.  Has had some nausea this AM, but no emesis.  Also complains of mid abdominal pain to around a 8/10.  Objective: Vital signs in last 24 hours: Temp:  [98.9 F (37.2 C)-101.6 F (38.7 C)] 100.3 F (37.9 C) (02/10 0422) Pulse Rate:  [98-115] 104 (02/10 0406) Resp:  [10-24] 20 (02/10 0800) BP: (116-167)/(60-132) 147/82 (02/10 0800) SpO2:  [91 %-96 %] 96 % (02/10 0800) Last BM Date : 02/07/23  Intake/Output from previous day: 02/09 0701 - 02/10 0700 In: 3321.8 [I.V.:1211.8; IV Piggyback:2110] Out: 2600 [Urine:2600] Intake/Output this shift: No intake/output data recorded.  PE: Gen:  Alert, NAD, pleasant Card:  Tachycardic with regular rhythm Pulm:  CTAB, no W/R/R, effort normal Abd: mild distension, soft.  mild tenderness centrally without peritonitis  Psych: A&Ox3    Lab Results:  Recent Labs    02/07/23 1539 02/08/23 0316  WBC 14.7* 14.7*  HGB 9.2* 8.7*  HCT 28.6* 27.4*  PLT 244 259   BMET Recent Labs    02/07/23 0505 02/08/23 0316  NA 137 134*  K 3.0* 3.9  CL 101 102  CO2 22 20*  GLUCOSE 83 100*  BUN 8 5*  CREATININE 0.51 0.51  CALCIUM 7.9* 7.7*   PT/INR Recent Labs    02/06/23 0106  LABPROT 16.8*  INR 1.4*   CMP     Component Value Date/Time   NA 134 (L) 02/08/2023 0316   K 3.9 02/08/2023 0316   CL 102 02/08/2023 0316   CO2 20 (L) 02/08/2023 0316   GLUCOSE 100 (H) 02/08/2023 0316   BUN 5 (L) 02/08/2023 0316   CREATININE 0.51 02/08/2023 0316   CALCIUM 7.7 (L) 02/08/2023 0316   PROT 6.0 (L) 02/08/2023 0316   ALBUMIN 2.2 (L) 02/08/2023 0316   AST 145 (H) 02/08/2023 0316   ALT 168 (H) 02/08/2023 0316   ALKPHOS 172 (H) 02/08/2023 0316   BILITOT 1.0 02/08/2023 0316   GFRNONAA >60 02/08/2023 0316   GFRAA >60 01/08/2019 0609   Lipase     Component Value Date/Time   LIPASE 24  02/02/2023 1254       Studies/Results: IR Angiogram Selective Each Additional Vessel  Result Date: 02/07/2023 INDICATION: Hemorrhagic liver mass Briefly, 57 year old female with history of multifocal hepatic adenomas with previous resections presenting with hemorrhagic dominant RIGHT hepatic lobe tumor. EXAM: Procedures: 1. CELIAC and HEPATIC ARTERIOGRAPHY 2. GEL FOAM EMBOLIZATION of HEMORRHAGIC RIGHT HEPATIC LOBE MASS COMPARISON:  CT CAP, earlier same day. MEDICATIONS: 3.375 Zosyn IV. The antibiotic was administered within 1 hour of the procedure. 4 mg Zofran IV. ANESTHESIA/SEDATION: Moderate (conscious) sedation was employed during this procedure. A total of Versed 2 mg and Fentanyl 100 mcg was administered intravenously. Moderate Sedation Time: 71 minutes. The patient's level of consciousness and vital signs were monitored continuously by radiology nursing throughout the procedure under my direct supervision. CONTRAST:  50 mL Omnipaque 300 FLUOROSCOPY TIME:  Fluoroscopic dose; AB-123456789 mGy COMPLICATIONS: None immediate. PROCEDURE: Informed consent was obtained from the patient and/or patient's representative following explanation of the procedure, risks, benefits and alternatives. All questions were addressed. A time out was performed prior to the initiation of the procedure. Maximal barrier sterile technique utilized including caps, mask, sterile gowns, sterile gloves, large sterile drape, hand hygiene, and chlorhexidine prep. At the RIGHT groin 1% lidocaine  was used for local anesthesia. Limited ultrasound imaging of the groin shows the RIGHT femoral artery to be patent. An ultrasound image was saved and sent to PACS. Arterial access was obtained with a 21-G, 7-cm needle under direct ultrasound guidance, through which a 0.018-inch guidewire was advanced under fluoroscopy. The 21-G needle was then exchanged for a micropuncture catheter and a 0.035-inch Bentson guidewire. The micropuncture catheter was  exchanged for a 5 Fr sheath. Over the wire, a 5 Fr Cobra C2 catheter was positioned within the proximal abdominal aorta and used to select the celiac axis. Once catheterized, a digital subtraction angiogram (DSA) was obtained. A 2.8 Fr 110 cm Progreat microcatheter and 0.016 inch Fathom microwire was used to gain access to the common then proper then LEFT and RIGHT arteries. Selective DSAs were performed at each level. Finally the target vessel branching off the RIGHT hepatic artery was isolated and a subselective DSA was performed. PURPOSE OF THE ARTERIOGRAM: No previous catheter-directed angiogram was available. Therefore a new complete diagnostic angiography was performed. The decision to proceed with an interventional procedure was made based on this new diagnostic angiogram. Selective branch RIGHT hepatic artery embolization was then performed using Gelfoam slurry. A final angiogram was performed. Images were reviewed and the procedure was terminated. All wires, catheters and sheaths were removed from the patient. Hemostasis was achieved at the RIGHT groin access site with Angio-Seal closure. The patient tolerated the procedure well without immediate post procedural complication. FINDINGS: *Hepatic arteriography with active extravasation at the large RIGHT hepatic lobe mass, originating off a cephalad-projecting branch. *Successful selective Gelfoam slurry embolization of a branch RIGHT hepatic artery, as above. IMPRESSION: Active extravasation from large RIGHT hepatic lobe mass, successfully treated with selective catheter-directed Gelfoam embolization. PLAN: *Post sheath removal precautions including RIGHT lower extremity slight straight x2 hours. *Serial abdominal examinations and H/h trend to evaluate potential ongoing hepatic bleeding. Michaelle Birks, MD [facility] Radiology Electronically Signed   By: Michaelle Birks M.D.   On: 02/07/2023 11:40   DG Abd 1  View  Result Date: 02/07/2023 CLINICAL DATA:  Ileus.  Liver embolization yesterday. EXAM: ABDOMEN - 1 VIEW COMPARISON:  CT abdomen 02/06/2023 FINDINGS: Contrast medium noted in the colon. No dilated small bowel identified, a left abdominal small bowel loop measures 2.6 cm in diameter. Calcified uterine fibroid noted. Stable high density material lining the inferior margin of the right hepatic lobe likely residual from prior resections. Contrast medium in the urinary bladder. IMPRESSION: 1. No dilated small bowel identified. Contrast medium in the colon. 2. Calcified uterine fibroid. 3. Contrast medium in the urinary bladder. Electronically Signed   By: Van Clines M.D.   On: 02/07/2023 10:05   IR US Guide Vasc Access Right  Result Date: 02/06/2023 INDICATION: Hemorrhagic liver mass Briefly, 57 year old female with history of multifocal hepatic adenomas with previous resections presenting with hemorrhagic dominant RIGHT hepatic lobe tumor. EXAM: Procedures: 1. CELIAC and HEPATIC ARTERIOGRAPHY 2. GEL FOAM EMBOLIZATION of HEMORRHAGIC RIGHT HEPATIC LOBE MASS COMPARISON:  CT CAP, earlier same day. MEDICATIONS: 3.375 Zosyn IV. The antibiotic was administered within 1 hour of the procedure. 4 mg Zofran IV. ANESTHESIA/SEDATION: Moderate (conscious) sedation was employed during this procedure. A total of Versed 2 mg and Fentanyl 100 mcg was administered intravenously. Moderate Sedation Time: 71 minutes. The patient's level of consciousness and vital signs were monitored continuously by radiology nursing throughout the procedure under my direct supervision. CONTRAST:  50 mL Omnipaque 300 FLUOROSCOPY TIME:  Fluoroscopic dose; [id number] mGy COMPLICATIONS: None immediate. PROCEDURE: Informed consent was obtained from the patient and/or patient's representative following explanation of the procedure, risks, benefits and alternatives. All questions were addressed. A time out was performed prior to the initiation of the  procedure. Maximal barrier sterile technique utilized including caps, mask, sterile gowns, sterile gloves, large sterile drape, hand hygiene, and chlorhexidine prep. At the RIGHT groin 1% lidocaine was used for local anesthesia. Limited ultrasound imaging of the groin shows the RIGHT femoral artery to be patent. An ultrasound image was saved and sent to PACS. Arterial access was obtained with a 21-G, 7-cm needle under direct ultrasound guidance, through which a 0.018-inch guidewire was advanced under fluoroscopy. The 21-G needle was then exchanged for a micropuncture catheter and a 0.035-inch Bentson guidewire. The micropuncture catheter was exchanged for a 5 Fr sheath. Over the wire, a 5 Fr Cobra C2 catheter was positioned within the proximal abdominal aorta and used to select the celiac axis. Once catheterized, a digital subtraction angiogram (DSA) was obtained. A 2.8 Fr 110 cm Progreat microcatheter and 0.016 inch Fathom microwire was used to gain access to the common then proper then LEFT and RIGHT arteries. Selective DSAs were performed at each level. Finally the target vessel branching off the RIGHT hepatic artery was isolated and a subselective DSA was performed. PURPOSE OF THE ARTERIOGRAM: No previous catheter-directed angiogram was available. Therefore a new complete diagnostic angiography was performed. The decision to proceed with an interventional procedure was made based on this new diagnostic angiogram. Selective branch RIGHT hepatic artery embolization was then performed using Gelfoam slurry. A final angiogram was performed. Images were reviewed and the procedure was terminated. All wires, catheters and sheaths were removed from the patient. Hemostasis was achieved at the RIGHT groin access site with Angio-Seal closure. The patient tolerated the procedure well without immediate post procedural complication. FINDINGS: *Hepatic arteriography with active extravasation at the large RIGHT hepatic lobe  mass, originating off a cephalad-projecting branch. *Successful selective Gelfoam slurry embolization of a branch RIGHT hepatic artery, as above. IMPRESSION: Active extravasation from large RIGHT hepatic lobe mass, successfully treated with selective catheter-directed Gelfoam embolization. PLAN: *Post sheath removal precautions including RIGHT lower extremity slight straight x2 hours. *Serial abdominal examinations and H/h trend to evaluate potential ongoing hepatic bleeding. Michaelle Birks, MD [facility] Radiology Electronically Signed   By: Michaelle Birks M.D.   On: 02/06/2023 20:57   IR EMBO ART  VEN HEMORR LYMPH EXTRAV  INC GUIDE ROADMAPPING  Result Date: 02/06/2023 INDICATION: Hemorrhagic liver mass Briefly, 57 year old female with history of multifocal hepatic adenomas with previous resections presenting with hemorrhagic dominant RIGHT hepatic lobe tumor. EXAM: Procedures: 1. CELIAC and HEPATIC ARTERIOGRAPHY 2. GEL FOAM EMBOLIZATION of HEMORRHAGIC RIGHT HEPATIC LOBE MASS COMPARISON:  CT CAP, earlier same day. MEDICATIONS: 3.375 Zosyn IV. The antibiotic was administered within 1 hour of the procedure. 4 mg Zofran IV. ANESTHESIA/SEDATION: Moderate (conscious) sedation was employed during this procedure. A total of Versed 2 mg and Fentanyl 100 mcg was administered intravenously. Moderate Sedation Time: 71 minutes. The patient's level of consciousness and vital signs were monitored continuously by radiology nursing throughout the procedure under my direct supervision. CONTRAST:  50 mL Omnipaque 300 FLUOROSCOPY TIME:  Fluoroscopic dose; [id number] mGy COMPLICATIONS: None immediate. PROCEDURE: Informed consent was obtained from the patient and/or patient's representative following explanation of the procedure, risks, benefits and alternatives. All questions were addressed. A time out was performed prior to the initiation of  the procedure. Maximal barrier sterile technique  utilized including caps, mask, sterile gowns, sterile gloves, large sterile drape, hand hygiene, and chlorhexidine prep. At the RIGHT groin 1% lidocaine was used for local anesthesia. Limited ultrasound imaging of the groin shows the RIGHT femoral artery to be patent. An ultrasound image was saved and sent to PACS. Arterial access was obtained with a 21-G, 7-cm needle under direct ultrasound guidance, through which a 0.018-inch guidewire was advanced under fluoroscopy. The 21-G needle was then exchanged for a micropuncture catheter and a 0.035-inch Bentson guidewire. The micropuncture catheter was exchanged for a 5 Fr sheath. Over the wire, a 5 Fr Cobra C2 catheter was positioned within the proximal abdominal aorta and used to select the celiac axis. Once catheterized, a digital subtraction angiogram (DSA) was obtained. A 2.8 Fr 110 cm Progreat microcatheter and 0.016 inch Fathom microwire was used to gain access to the common then proper then LEFT and RIGHT arteries. Selective DSAs were performed at each level. Finally the target vessel branching off the RIGHT hepatic artery was isolated and a subselective DSA was performed. PURPOSE OF THE ARTERIOGRAM: No previous catheter-directed angiogram was available. Therefore a new complete diagnostic angiography was performed. The decision to proceed with an interventional procedure was made based on this new diagnostic angiogram. Selective branch RIGHT hepatic artery embolization was then performed using Gelfoam slurry. A final angiogram was performed. Images were reviewed and the procedure was terminated. All wires, catheters and sheaths were removed from the patient. Hemostasis was achieved at the RIGHT groin access site with Angio-Seal closure. The patient tolerated the procedure well without immediate post procedural complication. FINDINGS: *Hepatic arteriography with active extravasation at the large RIGHT hepatic lobe mass, originating off a cephalad-projecting  branch. *Successful selective Gelfoam slurry embolization of a branch RIGHT hepatic artery, as above. IMPRESSION: Active extravasation from large RIGHT hepatic lobe mass, successfully treated with selective catheter-directed Gelfoam embolization. PLAN: *Post sheath removal precautions including RIGHT lower extremity slight straight x2 hours. *Serial abdominal examinations and H/h trend to evaluate potential ongoing hepatic bleeding. Michaelle Birks, MD [facility] Radiology Specialists [facility] Radiology Electronically Signed   By: Michaelle Birks M.D.   On: 02/06/2023 20:57   IR Angiogram Visceral Selective  Result Date: 02/06/2023 INDICATION: Hemorrhagic liver mass Briefly, 57 year old female with history of multifocal hepatic adenomas with previous resections presenting with hemorrhagic dominant RIGHT hepatic lobe tumor. EXAM: Procedures: 1. CELIAC and HEPATIC ARTERIOGRAPHY 2. GEL FOAM EMBOLIZATION of HEMORRHAGIC RIGHT HEPATIC LOBE MASS COMPARISON:  CT CAP, earlier same day. MEDICATIONS: 3.375 Zosyn IV. The antibiotic was administered within 1 hour of the procedure. 4 mg Zofran IV. ANESTHESIA/SEDATION: Moderate (conscious) sedation was employed during this procedure. A total of Versed 2 mg and Fentanyl 100 mcg was administered intravenously. Moderate Sedation Time: 71 minutes. The patient's level of consciousness and vital signs were monitored continuously by radiology nursing throughout the procedure under my direct supervision. CONTRAST:  50 mL Omnipaque 300 FLUOROSCOPY TIME:  Fluoroscopic dose; [id number] mGy COMPLICATIONS: None immediate. PROCEDURE: Informed consent was obtained from the patient and/or patient's representative following explanation of the procedure, risks, benefits and alternatives. All questions were addressed. A time out was performed prior to the initiation of the procedure. Maximal barrier sterile technique utilized including caps, mask, sterile gowns, sterile gloves, large sterile  drape, hand hygiene, and chlorhexidine prep. At the RIGHT groin 1% lidocaine was used for local anesthesia. Limited ultrasound imaging of the groin shows the RIGHT femoral artery to be patent. An  ultrasound image was saved and sent to PACS. Arterial access was obtained with a 21-G, 7-cm needle under direct ultrasound guidance, through which a 0.018-inch guidewire was advanced under fluoroscopy. The 21-G needle was then exchanged for a micropuncture catheter and a 0.035-inch Bentson guidewire. The micropuncture catheter was exchanged for a 5 Fr sheath. Over the wire, a 5 Fr Cobra C2 catheter was positioned within the proximal abdominal aorta and used to select the celiac axis. Once catheterized, a digital subtraction angiogram (DSA) was obtained. A 2.8 Fr 110 cm Progreat microcatheter and 0.016 inch Fathom microwire was used to gain access to the common then proper then LEFT and RIGHT arteries. Selective DSAs were performed at each level. Finally the target vessel branching off the RIGHT hepatic artery was isolated and a subselective DSA was performed. PURPOSE OF THE ARTERIOGRAM: No previous catheter-directed angiogram was available. Therefore a new complete diagnostic angiography was performed. The decision to proceed with an interventional procedure was made based on this new diagnostic angiogram. Selective branch RIGHT hepatic artery embolization was then performed using Gelfoam slurry. A final angiogram was performed. Images were reviewed and the procedure was terminated. All wires, catheters and sheaths were removed from the patient. Hemostasis was achieved at the RIGHT groin access site with Angio-Seal closure. The patient tolerated the procedure well without immediate post procedural complication. FINDINGS: *Hepatic arteriography with active extravasation at the large RIGHT hepatic lobe mass, originating off a cephalad-projecting branch. *Successful selective Gelfoam slurry embolization of a branch RIGHT  hepatic artery, as above. IMPRESSION: Active extravasation from large RIGHT hepatic lobe mass, successfully treated with selective catheter-directed Gelfoam embolization. PLAN: *Post sheath removal precautions including RIGHT lower extremity slight straight x2 hours. *Serial abdominal examinations and H/h trend to evaluate potential ongoing hepatic bleeding. Michaelle Birks, MD [facility] Special[facility] Radiology Electronically Signed   By: Michaelle Birks M.D.   On: 02/06/2023 20:57   IR Angiogram Selective Each Additional Vessel  Result Date: 02/06/2023 INDICATION: Hemorrhagic liver mass Briefly, 57 year old female with history of multifocal hepatic adenomas with previous resections presenting with hemorrhagic dominant RIGHT hepatic lobe tumor. EXAM: Procedures: 1. CELIAC and HEPATIC ARTERIOGRAPHY 2. GEL FOAM EMBOLIZATION of HEMORRHAGIC RIGHT HEPATIC LOBE MASS COMPARISON:  CT CAP, earlier same day. MEDICATIONS: 3.375 Zosyn IV. The antibiotic was administered within 1 hour of the procedure. 4 mg Zofran IV. ANESTHESIA/SEDATION: Moderate (conscious) sedation was employed during this procedure. A total of Versed 2 mg and Fentanyl 100 mcg was administered intravenously. Moderate Sedation Time: 71 minutes. The patient's level of consciousness and vital signs were monitored continuously by radiology nursing throughout the procedure under my direct supervision. CONTRAST:  50 mL Omnipaque 300 FLUOROSCOPY TIME:  Fluoroscopic dose; [id number] mGy COMPLICATIONS: None immediate. PROCEDURE: Informed consent was obtained from the patient and/or patient's representative following explanation of the procedure, risks, benefits and alternatives. All questions were addressed. A time out was performed prior to the initiation of the procedure. Maximal barrier sterile technique utilized including caps, mask, sterile gowns, sterile gloves, large sterile drape, hand hygiene, and chlorhexidine prep. At the RIGHT  groin 1% lidocaine was used for local anesthesia. Limited ultrasound imaging of the groin shows the RIGHT femoral artery to be patent. An ultrasound image was saved and sent to PACS. Arterial access was obtained with a 21-G, 7-cm needle under direct ultrasound guidance, through which a 0.018-inch guidewire was advanced under fluoroscopy. The 21-G needle was then exchanged for a micropuncture catheter and a 0.035-inch Bentson guidewire. The micropuncture  catheter was exchanged for a 5 Fr sheath. Over the wire, a 5 Fr Cobra C2 catheter was positioned within the proximal abdominal aorta and used to select the celiac axis. Once catheterized, a digital subtraction angiogram (DSA) was obtained. A 2.8 Fr 110 cm Progreat microcatheter and 0.016 inch Fathom microwire was used to gain access to the common then proper then LEFT and RIGHT arteries. Selective DSAs were performed at each level. Finally the target vessel branching off the RIGHT hepatic artery was isolated and a subselective DSA was performed. PURPOSE OF THE ARTERIOGRAM: No previous catheter-directed angiogram was available. Therefore a new complete diagnostic angiography was performed. The decision to proceed with an interventional procedure was made based on this new diagnostic angiogram. Selective branch RIGHT hepatic artery embolization was then performed using Gelfoam slurry. A final angiogram was performed. Images were reviewed and the procedure was terminated. All wires, catheters and sheaths were removed from the patient. Hemostasis was achieved at the RIGHT groin access site with Angio-Seal closure. The patient tolerated the procedure well without immediate post procedural complication. FINDINGS: *Hepatic arteriography with active extravasation at the large RIGHT hepatic lobe mass, originating off a cephalad-projecting branch. *Successful selective Gelfoam slurry embolization of a branch RIGHT hepatic artery, as above. IMPRESSION: Active extravasation from  large RIGHT hepatic lobe mass, successfully treated with selective catheter-directed Gelfoam embolization. PLAN: *Post sheath removal precautions including RIGHT lower extremity slight straight x2 hours. *Serial abdominal examinations and H/h trend to evaluate potential ongoing hepatic bleeding. Michaelle Birks, MD [facility] Inter[facility] Radiology Specialists [facility] Radiology Electronically Signed   By: Michaelle Birks M.D.   On: 02/06/2023 20:57   IR Angiogram Selective Each Additional Vessel  Result Date: 02/06/2023 INDICATION: Hemorrhagic liver mass Briefly, 57 year old female with history of multifocal hepatic adenomas with previous resections presenting with hemorrhagic dominant RIGHT hepatic lobe tumor. EXAM: Procedures: 1. CELIAC and HEPATIC ARTERIOGRAPHY 2. GEL FOAM EMBOLIZATION of HEMORRHAGIC RIGHT HEPATIC LOBE MASS COMPARISON:  CT CAP, earlier same day. MEDICATIONS: 3.375 Zosyn IV. The antibiotic was administered within 1 hour of the procedure. 4 mg Zofran IV. ANESTHESIA/SEDATION: Moderate (conscious) sedation was employed during this procedure. A total of Versed 2 mg and Fentanyl 100 mcg was administered intravenously. Moderate Sedation Time: 71 minutes. The patient's level of consciousness and vital signs were monitored continuously by radiology nursing throughout the procedure under my direct supervision. CONTRAST:  50 mL Omnipaque 300 FLUOROSCOPY TIME:  Fluoroscopic dose; [id number] mGy COMPLICATIONS: None immediate. PROCEDURE: Informed consent was obtained from the patient and/or patient's representative following explanation of the procedure, risks, benefits and alternatives. All questions were addressed. A time out was performed prior to the initiation of the procedure. Maximal barrier sterile technique utilized including caps, mask, sterile gowns, sterile gloves, large sterile drape, hand hygiene, and chlorhexidine prep. At the RIGHT groin 1% lidocaine was used for local anesthesia. Limited  ultrasound imaging of the groin shows the RIGHT femoral artery to be patent. An ultrasound image was saved and sent to PACS. Arterial access was obtained with a 21-G, 7-cm needle under direct ultrasound guidance, through which a 0.018-inch guidewire was advanced under fluoroscopy. The 21-G needle was then exchanged for a micropuncture catheter and a 0.035-inch Bentson guidewire. The micropuncture catheter was exchanged for a 5 Fr sheath. Over the wire, a 5 Fr Cobra C2 catheter was positioned within the proximal abdominal aorta and used to select the celiac axis. Once catheterized, a digital subtraction angiogram (DSA) was obtained. A 2.8 Fr 110 cm Progreat microcatheter  and 0.016 inch Fathom microwire was used to gain access to the common then proper then LEFT and RIGHT arteries. Selective DSAs were performed at each level. Finally the target vessel branching off the RIGHT hepatic artery was isolated and a subselective DSA was performed. PURPOSE OF THE ARTERIOGRAM: No previous catheter-directed angiogram was available. Therefore a new complete diagnostic angiography was performed. The decision to proceed with an interventional procedure was made based on this new diagnostic angiogram. Selective branch RIGHT hepatic artery embolization was then performed using Gelfoam slurry. A final angiogram was performed. Images were reviewed and the procedure was terminated. All wires, catheters and sheaths were removed from the patient. Hemostasis was achieved at the RIGHT groin access site with Angio-Seal closure. The patient tolerated the procedure well without immediate post procedural complication. FINDINGS: *Hepatic arteriography with active extravasation at the large RIGHT hepatic lobe mass, originating off a cephalad-projecting branch. *Successful selective Gelfoam slurry embolization of a branch RIGHT hepatic artery, as above. IMPRESSION: Active extravasation from large RIGHT hepatic lobe mass, successfully treated with  selective catheter-directed Gelfoam embolization. PLAN: *Post sheath removal precautions including RIGHT lower extremity slight straight x2 hours. *Serial abdominal examinations and H/h trend to evaluate potential ongoing hepatic bleeding. Michaelle Birks, MD [facility] Radiology Electronically Signed   By: Michaelle Birks M.D.   On: 02/06/2023 20:57   IR Angiogram Selective Each Additional Vessel  Result Date: 02/06/2023 INDICATION: Hemorrhagic liver mass Briefly, 57 year old female with history of multifocal hepatic adenomas with previous resections presenting with hemorrhagic dominant RIGHT hepatic lobe tumor. EXAM: Procedures: 1. CELIAC and HEPATIC ARTERIOGRAPHY 2. GEL FOAM EMBOLIZATION of HEMORRHAGIC RIGHT HEPATIC LOBE MASS COMPARISON:  CT CAP, earlier same day. MEDICATIONS: 3.375 Zosyn IV. The antibiotic was administered within 1 hour of the procedure. 4 mg Zofran IV. ANESTHESIA/SEDATION: Moderate (conscious) sedation was employed during this procedure. A total of Versed 2 mg and Fentanyl 100 mcg was administered intravenously. Moderate Sedation Time: 71 minutes. The patient's level of consciousness and vital signs were monitored continuously by radiology nursing throughout the procedure under my direct supervision. CONTRAST:  50 mL Omnipaque 300 FLUOROSCOPY TIME:  Fluoroscopic dose; [id number] mGy COMPLICATIONS: None immediate. PROCEDURE: Informed consent was obtained from the patient and/or patient's representative following explanation of the procedure, risks, benefits and alternatives. All questions were addressed. A time out was performed prior to the initiation of the procedure. Maximal barrier sterile technique utilized including caps, mask, sterile gowns, sterile gloves, large sterile drape, hand hygiene, and chlorhexidine prep. At the RIGHT groin 1% lidocaine was used for local anesthesia. Limited ultrasound imaging of the groin shows the RIGHT femoral artery to  be patent. An ultrasound image was saved and sent to PACS. Arterial access was obtained with a 21-G, 7-cm needle under direct ultrasound guidance, through which a 0.018-inch guidewire was advanced under fluoroscopy. The 21-G needle was then exchanged for a micropuncture catheter and a 0.035-inch Bentson guidewire. The micropuncture catheter was exchanged for a 5 Fr sheath. Over the wire, a 5 Fr Cobra C2 catheter was positioned within the proximal abdominal aorta and used to select the celiac axis. Once catheterized, a digital subtraction angiogram (DSA) was obtained. A 2.8 Fr 110 cm Progreat microcatheter and 0.016 inch Fathom microwire was used to gain access to the common then proper then LEFT and RIGHT arteries. Selective DSAs were performed at each level. Finally the target vessel branching off the RIGHT hepatic artery was isolated and a subselective DSA was performed. PURPOSE OF  THE ARTERIOGRAM: No previous catheter-directed angiogram was available. Therefore a new complete diagnostic angiography was performed. The decision to proceed with an interventional procedure was made based on this new diagnostic angiogram. Selective branch RIGHT hepatic artery embolization was then performed using Gelfoam slurry. A final angiogram was performed. Images were reviewed and the procedure was terminated. All wires, catheters and sheaths were removed from the patient. Hemostasis was achieved at the RIGHT groin access site with Angio-Seal closure. The patient tolerated the procedure well without immediate post procedural complication. FINDINGS: *Hepatic arteriography with active extravasation at the large RIGHT hepatic lobe mass, originating off a cephalad-projecting branch. *Successful selective Gelfoam slurry embolization of a branch RIGHT hepatic artery, as above. IMPRESSION: Active extravasation from large RIGHT hepatic lobe mass, successfully treated with selective catheter-directed Gelfoam embolization. PLAN: *Post  sheath removal precautions including RIGHT lower extremity slight straight x2 hours. *Serial abdominal examinations and H/h trend to evaluate potential ongoing hepatic bleeding. Michaelle Birks, MD [facility] Radiology Electronically Signed   By: Michaelle Birks M.D.   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 EXAM: CT CHEST, ABDOMEN, AND PELVIS WITH CONTRAST TECHNIQUE: Multidetector CT imaging of the chest, abdomen and pelvis was performed following the standard protocol during bolus administration of intravenous contrast. RADIATION DOSE REDUCTION: This exam was performed according to the departmental dose-optimization program which includes automated exposure control, adjustment of the mA and/or kV according to patient size and/or use of iterative reconstruction technique. CONTRAST:  138m OMNIPAQUE IOHEXOL 300 MG/ML  SOLN COMPARISON:  CT AP, 02/02/2023 and 03/26/2021. Chest XR, concurrent. FINDINGS: CT CHEST FINDINGS Cardiovascular: No acute intrathoracic vascular abnormality. Normal heart size. No pericardial effusion. Mediastinum/Nodes: No enlarged mediastinal, hilar, or axillary lymph nodes. Thyroid gland, trachea, and esophagus demonstrate no significant findings. Lungs/Pleura: Bilateral 8 mm subsolid pulmonary nodules, within the LEFT upper and RIGHT lower lobes. Additional 5 mm nodularity along the RIGHT major fissure is likely to represent an intrapulmonary lymph node. Lungs are otherwise clear without focal consolidation or mass. No pleural effusion or pneumothorax. Musculoskeletal: No acute chest wall mass or suspicious bone lesion. CT ABDOMEN PELVIS FINDINGS Hepatobiliary: *Dominant RIGHT superior hepatic lobe mass appears heterogeneously dense with new prominent hypodense component. *The  mass measures approximately 8.0 x 10.2 x 10.0 cm (AP by transaxial by CC), previously 9.9 x 12.8 (AP by transaxial). *Question enhancing focus medial to the mass, suspicious for angiographic spot sign and active extravasation. *Postsurgical changes of prior RIGHT and LEFT liver mass resections. Status post cholecystectomy. No biliary dilatation. Pancreas: No pancreatic ductal dilatation. Spleen: Normal in size without focal abnormality. Adrenals/Urinary Tract: Adrenal glands are unremarkable. Kidneys are normal, without renal calculi, focal lesion, or hydronephrosis. Bladder is unremarkable. Stomach/Bowel: Stomach is within normal limits. Appendix appears normal. Nonobstructed small bowel. Nondilated colon. Contrast opacification of distal small bowel and colon without extraluminal extravasation. No evidence of bowel wall thickening, distention, or inflammatory changes. Vascular/Lymphatic: No significant vascular findings are present. No enlarged abdominal or pelvic lymph nodes. Reproductive: Peripherally-calcified RIGHT fibroid. Prominent ovarian follicles. No adnexal mass. Other: Trace perirenal, peripancreatic and anterior pararenal space fat stranding. Trace body wall edema. No abdominal wall hernia or abnormality. No abdominopelvic ascites. Musculoskeletal: No acute or significant osseous findings. IMPRESSION: 1. Question of hemorrhagic conversion of dominant RIGHT hepatic lobe mass, previously characterized as adenoma. Small  focus of enhancement medial to the mass is suspicious for active extravasation. See key image. 2. Trace LEFT perirenal and anterior pararenal space fat stranding is nonspecific. Early pyelonephritis can appear similar. Correlate with urinalysis. 3. Bilateral 8 mm part-solid pulmonary nodules. Per Fleischner Society Guidelines, recommend a non-contrast Chest CT at 3-6 months. These guidelines do not apply to immunocompromised patients and patients with cancer. Reference: Radiology. 2017;  284(1):228-43. These results were called by telephone at the time of interpretation on 02/06/2023 at 3:46 pm to provider [facility] , who verbally acknowledged these results. Electronically Signed   By: Michaelle Birks M.D.   On: 02/06/2023 15:51    Anti-infectives: Anti-infectives (From admission, onward)    Start     Dose/Rate Route Frequency Ordered Stop   02/07/23 0200  vancomycin (VANCOREADY) IVPB 1250 mg/250 mL  Status:  Discontinued        1,250 mg 166.7 mL/hr over 90 Minutes Intravenous Every 24 hours 02/06/23 0334 02/08/23 0825   02/06/23 1928  piperacillin-tazobactam (ZOSYN) IVPB        over 240 Minutes  Continuous PRN 02/06/23 1932 02/06/23 1928   02/06/23 0800  ceFEPIme (MAXIPIME) 2 g in sodium chloride 0.9 % 100 mL IVPB        2 g 200 mL/hr over 30 Minutes Intravenous Every 8 hours 02/06/23 0103     02/06/23 0100  ceFEPIme (MAXIPIME) 2 g in sodium chloride 0.9 % 100 mL IVPB        2 g 200 mL/hr over 30 Minutes Intravenous  Once 02/06/23 0006 02/06/23 0051   02/06/23 0100  metroNIDAZOLE (FLAGYL) IVPB 500 mg        500 mg 100 mL/hr over 60 Minutes Intravenous Every 12 hours 02/06/23 0006 02/13/23 0059   02/06/23 0100  vancomycin (VANCOCIN) IVPB 1000 mg/200 mL premix  Status:  Discontinued        1,000 mg 200 mL/hr over 60 Minutes Intravenous  Once 02/06/23 0006 02/06/23 0011   02/06/23 0100  vancomycin (VANCOREADY) IVPB 1750 mg/350 mL        1,750 mg 175 mL/hr over 120 Minutes Intravenous  Once 02/06/23 0011 02/06/23 0443        Assessment/Plan  SBO Hepatic adenoma with hemorrhage - CT on admission w/ dilated mid small bowel in the central left hemiabdomen with transition in midline ventral abdomen. Hx left lateral and partial right hepatectomies as well as a cholecystectomy.  - Xray 2/7 very reassuring with no obvious dilated small bowel and contrast in colon.  - CT yesterday afternoon with right hepatic adenoma with hemorrhagic conversion - s/p IR embolization  ABL  anemia - stable.  - mobilize as able  - recommend keeping K>4.0 and Mg >2.0 to optimize bowel function - - no emergent surgical intervention warranted at this time   FEN - Advance diet as tolerated.  SBO resolved.   VTE - SCDs ID - Cefepime/Vanc/Flagyl empirically. Tmax 101.6. WBCs stable/elevated around 15.   I think the majority of her pain and nausea at this time is due to the hemoperitoneum.  Diet as tolerated.  Pain control.    LOS: 6 days   I reviewed Consultant IR notes, hospitalist notes, last 24 h vitals and pain scores, last 48 h intake and output, last 24 h labs and trends, and last 24 h imaging results.    Stark Klein, [facility] 02/08/2023, 8:40 AM Please see Amion for pager number during day hours 7:00am-4:30pm

## 2023-02-09 DIAGNOSIS — R748 Abnormal levels of other serum enzymes: Secondary | ICD-10-CM | POA: Diagnosis not present

## 2023-02-09 DIAGNOSIS — K7689 Other specified diseases of liver: Secondary | ICD-10-CM | POA: Diagnosis not present

## 2023-02-09 DIAGNOSIS — D134 Benign neoplasm of liver: Secondary | ICD-10-CM | POA: Diagnosis not present

## 2023-02-09 DIAGNOSIS — R509 Fever, unspecified: Secondary | ICD-10-CM | POA: Diagnosis not present

## 2023-02-09 DIAGNOSIS — K5669 Other partial intestinal obstruction: Secondary | ICD-10-CM | POA: Diagnosis not present

## 2023-02-09 DIAGNOSIS — Z22322 Carrier or suspected carrier of Methicillin resistant Staphylococcus aureus: Secondary | ICD-10-CM

## 2023-02-09 DIAGNOSIS — K56600 Partial intestinal obstruction, unspecified as to cause: Secondary | ICD-10-CM | POA: Diagnosis not present

## 2023-02-09 LAB — MAGNESIUM: Magnesium: 2.1 mg/dL (ref 1.7–2.4)

## 2023-02-09 LAB — CBC
HCT: 27.5 % — ABNORMAL LOW (ref 36.0–46.0)
Hemoglobin: 8.8 g/dL — ABNORMAL LOW (ref 12.0–15.0)
MCH: 29.3 pg (ref 26.0–34.0)
MCHC: 32 g/dL (ref 30.0–36.0)
MCV: 91.7 fL (ref 80.0–100.0)
Platelets: 292 10*3/uL (ref 150–400)
RBC: 3 MIL/uL — ABNORMAL LOW (ref 3.87–5.11)
RDW: 12.5 % (ref 11.5–15.5)
WBC: 13.3 10*3/uL — ABNORMAL HIGH (ref 4.0–10.5)
nRBC: 0 % (ref 0.0–0.2)

## 2023-02-09 LAB — BASIC METABOLIC PANEL
Anion gap: 8 (ref 5–15)
BUN: 6 mg/dL (ref 6–20)
CO2: 22 mmol/L (ref 22–32)
Calcium: 7.6 mg/dL — ABNORMAL LOW (ref 8.9–10.3)
Chloride: 105 mmol/L (ref 98–111)
Creatinine, Ser: 0.43 mg/dL — ABNORMAL LOW (ref 0.44–1.00)
GFR, Estimated: >60 mL/min (ref >60)
Glucose, Bld: 91 mg/dL (ref 70–99)
Potassium: 3.3 mmol/L — ABNORMAL LOW (ref 3.5–5.1)
Sodium: 135 mmol/L (ref 135–145)

## 2023-02-09 LAB — GLUCOSE, CAPILLARY: Glucose-Capillary: 92 mg/dL (ref 70–99)

## 2023-02-09 MED ORDER — SODIUM CHLORIDE 0.9% FLUSH
3.0000 mL | Freq: Two times a day (BID) | INTRAVENOUS | Status: DC
  Administered 2023-02-09: 3 mL via INTRAVENOUS

## 2023-02-09 MED ORDER — SODIUM CHLORIDE 0.9 % IV SOLN: INTRAVENOUS | Status: DC | PRN

## 2023-02-09 MED ORDER — POLYETHYLENE GLYCOL 3350 17 G PO PACK: 17.0000 g | PACK | Freq: Two times a day (BID) | ORAL | Status: DC | PRN

## 2023-02-09 MED ORDER — ACETAMINOPHEN 325 MG PO TABS
325.0000 mg | ORAL_TABLET | Freq: Four times a day (QID) | ORAL | Status: DC | PRN
  Administered 2023-02-09: 650 mg via ORAL
  Filled 2023-02-09: qty 2

## 2023-02-09 MED ORDER — METHOCARBAMOL 1000 MG/10ML IJ SOLN: 1000.0000 mg | Freq: Four times a day (QID) | INTRAVENOUS | Status: DC | PRN

## 2023-02-09 MED ORDER — METOCLOPRAMIDE HCL 5 MG/ML IJ SOLN: 5.0000 mg | Freq: Three times a day (TID) | INTRAMUSCULAR | Status: DC | PRN

## 2023-02-09 MED ORDER — POTASSIUM CHLORIDE CRYS ER 20 MEQ PO TBCR
40.0000 meq | EXTENDED_RELEASE_TABLET | Freq: Once | ORAL | Status: AC
  Administered 2023-02-09: 40 meq via ORAL
  Filled 2023-02-09: qty 2

## 2023-02-09 MED ORDER — ALUM & MAG HYDROXIDE-SIMETH 200-200-20 MG/5ML PO SUSP: 30.0000 mL | Freq: Four times a day (QID) | ORAL | Status: DC | PRN

## 2023-02-09 MED ORDER — POTASSIUM CHLORIDE 10 MEQ/100ML IV SOLN
10.0000 meq | INTRAVENOUS | Status: AC
  Administered 2023-02-09 – 2023-02-10 (×4): 10 meq via INTRAVENOUS
  Filled 2023-02-09 (×3): qty 100

## 2023-02-09 MED ORDER — METOCLOPRAMIDE HCL 5 MG PO TABS: 5.0000 mg | ORAL_TABLET | Freq: Three times a day (TID) | ORAL | Status: DC | PRN

## 2023-02-09 MED ORDER — HYDROMORPHONE HCL 1 MG/ML IJ SOLN: 0.5000 mg | INTRAMUSCULAR | Status: DC | PRN

## 2023-02-09 MED ORDER — LIP MEDEX EX OINT
TOPICAL_OINTMENT | Freq: Two times a day (BID) | CUTANEOUS | Status: DC
  Administered 2023-02-09: 75 via TOPICAL
  Filled 2023-02-09: qty 7

## 2023-02-09 MED ORDER — METHOCARBAMOL 500 MG PO TABS: 1000.0000 mg | ORAL_TABLET | Freq: Four times a day (QID) | ORAL | Status: DC | PRN

## 2023-02-09 MED ORDER — ONDANSETRON HCL 4 MG PO TABS
4.0000 mg | ORAL_TABLET | Freq: Three times a day (TID) | ORAL | Status: DC
  Administered 2023-02-09 – 2023-02-10 (×4): 4 mg via ORAL
  Filled 2023-02-09 (×4): qty 1

## 2023-02-09 MED ORDER — BISACODYL 10 MG RE SUPP: 10.0000 mg | Freq: Two times a day (BID) | RECTAL | Status: DC | PRN

## 2023-02-09 MED ORDER — CALCIUM POLYCARBOPHIL 625 MG PO TABS
625.0000 mg | ORAL_TABLET | Freq: Two times a day (BID) | ORAL | Status: DC
  Administered 2023-02-09 (×2): 625 mg via ORAL
  Filled 2023-02-09 (×2): qty 1

## 2023-02-09 MED ORDER — PHENOL 1.4 % MT LIQD: 2.0000 | OROMUCOSAL | Status: DC | PRN

## 2023-02-09 MED ORDER — ACETAMINOPHEN 650 MG RE SUPP: 650.0000 mg | Freq: Four times a day (QID) | RECTAL | Status: DC | PRN

## 2023-02-09 MED ORDER — MAGIC MOUTHWASH: 15.0000 mL | Freq: Four times a day (QID) | ORAL | Status: DC | PRN

## 2023-02-09 MED ORDER — PROCHLORPERAZINE EDISYLATE 10 MG/2ML IJ SOLN: 5.0000 mg | INTRAMUSCULAR | Status: DC | PRN

## 2023-02-09 MED ORDER — HYDROCODONE-ACETAMINOPHEN 5-325 MG PO TABS
1.0000 | ORAL_TABLET | ORAL | Status: DC | PRN
  Administered 2023-02-09: 1 via ORAL
  Filled 2023-02-09: qty 1

## 2023-02-09 MED ORDER — MENTHOL 3 MG MT LOZG: 1.0000 | LOZENGE | OROMUCOSAL | Status: DC | PRN

## 2023-02-09 MED ORDER — SODIUM CHLORIDE 0.9% FLUSH: 3.0000 mL | INTRAVENOUS | Status: DC | PRN

## 2023-02-09 NOTE — Progress Notes (Signed)
Pharmacy Antibiotic Note  Jodi Forbes is a 57 y.o. female admitted on 02/02/2023 with past medical history of hepatic adenoma status postsurgical resections in the past at [facility], presented to hospital with sharp abdominal pain with some nausea and vomiting. Marland Kitchen  Pharmacy has been consulted to dose vancomycin and cefepime for sepsis. 02/09/2023 Day # 4 cefepime/flagyl  WBC down 13.3, SCr 0.43 PCT 1.53 (2/10) Tmax 100.3 ? If bleeding adenoma is cause of fever  Plan: ? Stop abx Continue Cefepime 2gm IV q8h Continue Flagyl  Follow renal function, cultures and clinical course  Height: 5' 4"$  (162.6 cm) Weight: 83.1 kg (183 lb 3.2 oz) IBW/kg (Calculated) : 54.7  Temp (24hrs), Avg:99.3 F (37.4 C), Min:98.5 F (36.9 C), Max:100.3 F (37.9 C)  Recent Labs  Lab 02/05/23 0451 02/06/23 0106 02/06/23 0426 02/06/23 1615 02/07/23 0505 02/07/23 0942 02/07/23 1539 02/08/23 0316 02/09/23 0335  WBC 20.1* 16.9*  --    < > 13.7* 15.1* 14.7* 14.7* 13.3*  CREATININE 0.57 0.55  --   --  0.51  --   --  0.51 0.43*  LATICACIDVEN  --  1.0 0.7  --   --   --   --   --   --    < > = values in this interval not displayed.     Estimated Creatinine Clearance: 82.9 mL/min (A) (by C-G formula based on SCr of 0.43 mg/dL (L)).    No Known Allergies  Antimicrobials this admission: 2/8 vanc >>2/10 2/8 cefepime >> 2/8 flagyl >> Dose adjustments this admission:  Microbiology results: 2/8 BCx: ngtd 2/8 MRSA + HIV NR  Thank you for allowing pharmacy to be a part of this patient's care.  Eudelia Bunch, Pharm.D Use secure chat for questions 02/09/2023 2:07 PM

## 2023-02-09 NOTE — Progress Notes (Signed)
Randall Mega [id number] 04-23-1966  CARE TEAM:  PCP: Patient, No Pcp Per  Outpatient Care Team: Patient Care Team: Patient, No Pcp Per as PCP - General ([place]) Truitt Merle, MD as Consulting Physician (Hematology) Vrochides, Dioniosios, MD as Referring Physician (Surgical Oncology) Kyung Rudd, MD as Consulting Physician (Radiation Oncology)  Inpatient Treatment Team: Treatment Team: Attending Provider: Flora Lipps, MD; Consulting Physician: Edison Pace Md, MD; Rounding Team: Garner Gavel, MD; Registered Nurse: Claud Kelp, RN; Pharmacist: Eudelia Bunch, [facility]; Registered Nurse: Vernelle Emerald, RN; Charge Nurse: May, Barbara J, RN; Utilization Review: Claudie Leach, RN   Problem List:   Principal Problem:   Partial small bowel obstruction [facility]) Active Problems:   Hepatocellular adenoma   Elevated alkaline phosphatase level   Liver hemorrhage   Fever      * No surgery found *      Assessment  Stabilizing  [facility] Stay = 7 days)  Assessment/Plan  SBO  - CT on admission w/ dilated mid small bowel in the central left hemiabdomen with transition in midline ventral abdomen. Hx left lateral and partial right hepatectomies as well as a cholecystectomy.  - Xray 2/7 very reassuring with no obvious dilated small bowel and contrast in colon.  -Tolerating some liquids but with nausea.  Had bowel movements yesterday reassuring.  I think it is safe to do soft diet and advance to solid as tolerated. -Having some nausea with medications.  Sounds like narcotics.  Will switch from morphine/oxycodone to hydromorphone/hydrocodone.  See if that works better.  Methocarbamol alternative is as well.  Still of Tylenol as needed but not scheduled given her liver issues.  Hepatic adenoma with hemorrhage - CT yesterday afternoon with right hepatic adenoma with hemorrhagic conversion - s/p IR embolization  -ABL anemia - stable.  -Defer back to hepatic surgeon  at [facility], Dr Nino Glow, if further intervention needed.  - mobilize as able  - recommend keeping K>4.0 and Mg >2.0 to optimize bowel function - - no emergent surgical intervention warranted at this time    FEN - Advance diet as tolerated.  SBO resolved.   VTE - SCDs ID - Cefepime/Vanc/Flagyl empirically.  Afebrile now.  I see no reason for antibiotics from a surgical standpoint.  Defer to primary medicine service.  Apparently MRSA positive.  Most likely colonization.            I reviewed nursing notes, hospitalist notes, last 24 h vitals and pain scores, last 48 h intake and output, last 24 h labs and trends, and last 24 h imaging results. I have reviewed this patient's available data, including medical history, events of note, test results, etc as part of my evaluation.  A significant portion of that time was spent in counseling.  Care during the described time interval was provided by me.  This care required moderate level of medical decision making.  02/09/2023    Subjective: (Chief complaint)  Patient feeling a little better overall.  Some nausea but no emesis when receiving medications.  Sounds like either oxycodone or morphine.  Daughter at bedside.  Nurse at bedside.  Tolerated some full liquids.  Objective:  Vital signs:  Vitals:   02/09/23 0032 02/09/23 0500 02/09/23 0502 02/09/23 0600  BP:  131/71  127/70  Pulse:      Resp:  14  13  Temp: 100.3 F (37.9 C)  98.8 F (37.1 C)   TempSrc: Oral  Oral   SpO2:  Weight:      Height:        Last BM Date : 02/07/23  Intake/Output   Yesterday:  02/10 0701 - 02/11 0700 In: -  Out: 500 [Urine:500] This shift:  No intake/output data recorded.  Bowel function:  Flatus: No  BM:  YES  Drain: (No drain)   Physical Exam:  General: Pt awake/alert in no acute distress Eyes: PERRL, normal EOM.  Sclera clear.  No icterus Neuro: CN II-XII intact w/o focal sensory/motor deficits. Lymph:  No head/neck/groin lymphadenopathy Psych:  No delerium/psychosis/paranoia.  Mildly anxious but consolable.  Oriented x 4 HENT: Normocephalic, Mucus membranes moist.  No thrush Neck: Supple, No tracheal deviation.  No obvious thyromegaly Chest: No pain to chest wall compression.  Good respiratory excursion.  No audible wheezing CV:  Pulses intact.  Regular rhythm.  No major extremity edema MS: Normal AROM mjr joints.  No obvious deformity  Abdomen: Somewhat firm.  Mildy distended.  Nontender.  No evidence of peritonitis.  No incarcerated hernias.  Ext:   No deformity.  No mjr edema.  No cyanosis Skin: No petechiae / purpurea.  No major sores.  Warm and dry    Results:   Cultures: Recent Results (from the past 720 hour(s))  Culture, blood (x 2)     Status: None (Preliminary result)   Collection Time: 02/06/23  1:06 AM   Specimen: BLOOD  Result Value Ref Range Status   Specimen Description   Final    BLOOD BLOOD RIGHT ARM Performed at [facility] [address], [place], [place] [address]    Special Requests   Final    BOTTLES DRAWN AEROBIC AND ANAEROBIC Blood Culture adequate volume Performed at [facility] [address], [place], [place] [address]    Culture   Final    NO GROWTH 2 DAYS Performed at [facility] [address] [place], [place] [address]    Report Status PENDING  Incomplete  Culture, blood (x 2)     Status: None (Preliminary result)   Collection Time: 02/06/23  1:18 AM   Specimen: BLOOD  Result Value Ref Range Status   Specimen Description   Final    BLOOD BLOOD RIGHT HAND Performed at [facility] [address], [place], [place] [address]    Special Requests   Final    BOTTLES DRAWN AEROBIC ONLY Blood Culture adequate volume Performed at [facility] [address], [place], [place] [address]    Culture   Final    NO GROWTH 2 DAYS Performed at [facility] [address], [place],  [address]    Report Status PENDING  Incomplete  Resp panel by RT-PCR (RSV, Flu A&B, Covid) Urine, Clean Catch     Status: None   Collection Time: 02/06/23 10:43 AM   Specimen: Urine, Clean Catch; Nasal Swab  Result Value Ref Range Status   SARS Coronavirus 2 by RT PCR NEGATIVE NEGATIVE Final    Comment: (NOTE) SARS-CoV-2 target nucleic acids are NOT DETECTED.  The SARS-CoV-2 RNA is generally detectable in upper respiratory specimens during the acute phase of infection. The lowest concentration of SARS-CoV-2 viral copies this assay can detect is 138 copies/mL. A negative result does not preclude SARS-Cov-2 infection and should not be used as the sole basis for treatment or other patient management decisions. A negative result may occur with  improper specimen collection/handling, submission of specimen other than  nasopharyngeal swab, presence of viral mutation(s) within the areas targeted by this assay, and inadequate number of viral copies(<138 copies/mL). A negative result must be combined with clinical observations, patient history, and epidemiological information. The expected result is Negative.  Fact Sheet for Patients:  EntrepreneurPulse.com.au  Fact Sheet for Healthcare Providers:  IncredibleEmployment.be  This test is no t yet approved or cleared by the [place] FDA and  has been authorized for detection and/or diagnosis of SARS-CoV-2 by FDA under an Emergency Use Authorization (EUA). This EUA will remain  in effect (meaning this test can be used) for the duration of the COVID-19 declaration under Section 564(b)(1) of the Act, 21 U.S.C.section 360bbb-3(b)(1), unless the authorization is terminated  or revoked sooner.       Influenza A by PCR NEGATIVE NEGATIVE Final   Influenza B by PCR NEGATIVE NEGATIVE Final    Comment: (NOTE) The Xpert Xpress SARS-CoV-2/FLU/RSV plus assay is intended  as an aid in the diagnosis of influenza from Nasopharyngeal swab specimens and should not be used as a sole basis for treatment. Nasal washings and aspirates are unacceptable for Xpert Xpress SARS-CoV-2/FLU/RSV testing.  Fact Sheet for Patients: EntrepreneurPulse.com.au  Fact Sheet for Healthcare Providers: IncredibleEmployment.be  This test is not yet approved or cleared by the [place] FDA and has been authorized for detection and/or diagnosis of SARS-CoV-2 by FDA under an Emergency Use Authorization (EUA). This EUA will remain in effect (meaning this test can be used) for the duration of the COVID-19 declaration under Section 564(b)(1) of the Act, 21 U.S.C. section 360bbb-3(b)(1), unless the authorization is terminated or revoked.     Resp Syncytial Virus by PCR NEGATIVE NEGATIVE Final    Comment: (NOTE) Fact Sheet for Patients: EntrepreneurPulse.com.au  Fact Sheet for Healthcare Providers: IncredibleEmployment.be  This test is not yet approved or cleared by the [place] FDA and has been authorized for detection and/or diagnosis of SARS-CoV-2 by FDA under an Emergency Use Authorization (EUA). This EUA will remain in effect (meaning this test can be used) for the duration of the COVID-19 declaration under Section 564(b)(1) of the Act, 21 U.S.C. section 360bbb-3(b)(1), unless the authorization is terminated or revoked.  Performed at [facility], [place] [address], [place], [place] [address]   MRSA Next Gen by PCR, Nasal     Status: Abnormal   Collection Time: 02/06/23  7:22 PM   Specimen: Nasal Mucosa; Nasal Swab  Result Value Ref Range Status   MRSA by PCR Next Gen DETECTED (A) NOT DETECTED Final    Comment: (NOTE) The GeneXpert MRSA Assay (FDA approved for NASAL specimens only), is one component of a comprehensive MRSA colonization surveillance program. It is not  intended to diagnose MRSA infection nor to guide or monitor treatment for MRSA infections. Test performance is not FDA approved in patients less than 70 years old. Performed at [facility], [place] [address] [place], [place] [address]     Labs: Results for orders placed or performed during the hospital encounter of 02/02/23 (from the past 48 hour(s))  CBC     Status: Abnormal   Collection Time: 02/07/23  9:42 AM  Result Value Ref Range   WBC 15.1 (H) 4.0 - 10.5 K/uL   RBC 3.04 (L) 3.87 - 5.11 MIL/uL   Hemoglobin 8.9 (L) 12.0 - 15.0 g/dL   HCT 29.3 (L) 36.0 - 46.0 %   MCV 96.4 80.0 - 100.0 fL   MCH 29.3 26.0 - 34.0 pg  MCHC 30.4 30.0 - 36.0 g/dL   RDW 12.1 11.5 - 15.5 %   Platelets 269 150 - 400 K/uL   nRBC 0.0 0.0 - 0.2 %    Comment: Performed at [facility], [place] [address] [place], [place] [address]  AFP tumor marker     Status: None   Collection Time: 02/07/23 12:02 PM  Result Value Ref Range   AFP, Serum, Tumor Marker <1.8 0.0 - 9.2 ng/mL    Comment: (NOTE) Roche Diagnostics Electrochemiluminescence Immunoassay (ECLIA) Values obtained with different assay methods or kits cannot be used interchangeably.  Results cannot be interpreted as absolute evidence of the presence or absence of malignant disease. This test is not interpretable in pregnant females. Performed At: [facility] [place], [place] [id number] Rush Farmer MD [id number]   CBC     Status: Abnormal   Collection Time: 02/07/23  3:39 PM  Result Value Ref Range   WBC 14.7 (H) 4.0 - 10.5 K/uL   RBC 3.17 (L) 3.87 - 5.11 MIL/uL   Hemoglobin 9.2 (L) 12.0 - 15.0 g/dL   HCT 28.6 (L) 36.0 - 46.0 %   MCV 90.2 80.0 - 100.0 fL   MCH 29.0 26.0 - 34.0 pg   MCHC 32.2 30.0 - 36.0 g/dL   RDW 12.2 11.5 - 15.5 %   Platelets 244 150 - 400 K/uL   nRBC 0.0 0.0 - 0.2 %    Comment: Performed at [facility], [place] [address], [place],   [address]  Procalcitonin     Status: None   Collection Time: 02/08/23  3:16 AM  Result Value Ref Range   Procalcitonin 1.53 ng/mL    Comment:        Interpretation: PCT > 0.5 ng/mL and <= 2 ng/mL: Systemic infection (sepsis) is possible, but other conditions are known to elevate PCT as well. (NOTE)       Sepsis PCT Algorithm           Lower Respiratory Tract                                      Infection PCT Algorithm    ----------------------------     ----------------------------         PCT < 0.25 ng/mL                PCT < 0.10 ng/mL          Strongly encourage             Strongly discourage   discontinuation of antibiotics    initiation of antibiotics    ----------------------------     -----------------------------       PCT 0.25 - 0.50 ng/mL            PCT 0.10 - 0.25 ng/mL               OR       >80% decrease in PCT            Discourage initiation of                                            antibiotics      Encourage discontinuation  of antibiotics    ----------------------------     -----------------------------         PCT >= 0.50 ng/mL              PCT 0.26 - 0.50 ng/mL                AND       <80% decrease in PCT             Encourage initiation of                                             antibiotics       Encourage continuation           of antibiotics    ----------------------------     -----------------------------        PCT >= 0.50 ng/mL                  PCT > 0.50 ng/mL               AND         increase in PCT                  Strongly encourage                                      initiation of antibiotics    Strongly encourage escalation           of antibiotics                                     -----------------------------                                           PCT <= 0.25 ng/mL                                                 OR                                        > 80% decrease in PCT                                       Discontinue / Do not initiate                                             antibiotics  Performed at [facility] [address] [place], [place] [address]   CBC     Status: Abnormal   Collection Time: 02/08/23  3:16 AM  Result Value Ref Range   WBC 14.7 (H) 4.0 - 10.5 K/uL  RBC 2.97 (L) 3.87 - 5.11 MIL/uL   Hemoglobin 8.7 (L) 12.0 - 15.0 g/dL   HCT 27.4 (L) 36.0 - 46.0 %   MCV 92.3 80.0 - 100.0 fL   MCH 29.3 26.0 - 34.0 pg   MCHC 31.8 30.0 - 36.0 g/dL   RDW 12.3 11.5 - 15.5 %   Platelets 259 150 - 400 K/uL   nRBC 0.0 0.0 - 0.2 %    Comment: Performed at [facility], [place] [address] [place], [place] [address]  Magnesium     Status: None   Collection Time: 02/08/23  3:16 AM  Result Value Ref Range   Magnesium 1.9 1.7 - 2.4 mg/dL    Comment: Performed at [facility], [place] [address], [place], [place] [address]  Comprehensive metabolic panel     Status: Abnormal   Collection Time: 02/08/23  3:16 AM  Result Value Ref Range   Sodium 134 (L) 135 - 145 mmol/L   Potassium 3.9 3.5 - 5.1 mmol/L   Chloride 102 98 - 111 mmol/L   CO2 20 (L) 22 - 32 mmol/L   Glucose, Bld 100 (H) 70 - 99 mg/dL    Comment: Glucose reference range applies only to samples taken after fasting for at least 8 hours.   BUN 5 (L) 6 - 20 mg/dL   Creatinine, Ser 0.51 0.44 - 1.00 mg/dL   Calcium 7.7 (L) 8.9 - 10.3 mg/dL   Total Protein 6.0 (L) 6.5 - 8.1 g/dL   Albumin 2.2 (L) 3.5 - 5.0 g/dL   AST 145 (H) 15 - 41 U/L   ALT 168 (H) 0 - 44 U/L   Alkaline Phosphatase 172 (H) 38 - 126 U/L   Total Bilirubin 1.0 0.3 - 1.2 mg/dL   GFR, Estimated >60 >60 mL/min    Comment: (NOTE) Calculated using the CKD-EPI Creatinine Equation (2021)    Anion gap 12 5 - 15    Comment: Performed at [facility], [place] [address] [place], [place] [id number]  Basic metabolic panel     Status: Abnormal   Collection Time: 02/09/23  3:35 AM  Result Value Ref  Range   Sodium 135 135 - 145 mmol/L   Potassium 3.3 (L) 3.5 - 5.1 mmol/L   Chloride 105 98 - 111 mmol/L   CO2 22 22 - 32 mmol/L   Glucose, Bld 91 70 - 99 mg/dL    Comment: Glucose reference range applies only to samples taken after fasting for at least 8 hours.   BUN 6 6 - 20 mg/dL   Creatinine, Ser 0.43 (L) 0.44 - 1.00 mg/dL   Calcium 7.6 (L) 8.9 - 10.3 mg/dL   GFR, Estimated >60 >60 mL/min    Comment: (NOTE) Calculated using the CKD-EPI Creatinine Equation (2021)    Anion gap 8 5 - 15    Comment: Performed at [facility], [place] [address], [place], [place] [address]  CBC     Status: Abnormal   Collection Time: 02/09/23  3:35 AM  Result Value Ref Range   WBC 13.3 (H) 4.0 - 10.5 K/uL   RBC 3.00 (L) 3.87 - 5.11 MIL/uL   Hemoglobin 8.8 (L) 12.0 - 15.0 g/dL   HCT 27.5 (L) 36.0 - 46.0 %   MCV 91.7 80.0 - 100.0 fL   MCH 29.3 26.0 - 34.0 pg   MCHC 32.0 30.0 - 36.0 g/dL   RDW 12.5 11.5 - 15.5 %   Platelets 292  150 - 400 K/uL   nRBC 0.0 0.0 - 0.2 %    Comment: Performed at [facility], [place] [address], [place], [place] [address]  Magnesium     Status: None   Collection Time: 02/09/23  3:35 AM  Result Value Ref Range   Magnesium 2.1 1.7 - 2.4 mg/dL    Comment: Performed at [facility], [place] [address] [place], [place] [address]    Imaging / Studies: DG Abd 1 View  Result Date: 02/07/2023 CLINICAL DATA:  Ileus.  Liver embolization yesterday. EXAM: ABDOMEN - 1 VIEW COMPARISON:  CT abdomen 02/06/2023 FINDINGS: Contrast medium noted in the colon. No dilated small bowel identified, a left abdominal small bowel loop measures 2.6 cm in diameter. Calcified uterine fibroid noted. Stable high density material lining the inferior margin of the right hepatic lobe likely residual from prior resections. Contrast medium in the urinary bladder. IMPRESSION: 1. No dilated small bowel identified. Contrast medium in the colon. 2. Calcified uterine  fibroid. 3. Contrast medium in the urinary bladder. Electronically Signed   By: Van Clines M.D.   On: 02/07/2023 10:05    Medications / Allergies: per chart  Antibiotics: Anti-infectives (From admission, onward)    Start     Dose/Rate Route Frequency Ordered Stop   02/07/23 0200  vancomycin (VANCOREADY) IVPB 1250 mg/250 mL  Status:  Discontinued        1,250 mg 166.7 mL/hr over 90 Minutes Intravenous Every 24 hours 02/06/23 0334 02/08/23 0825   02/06/23 1928  piperacillin-tazobactam (ZOSYN) IVPB        over 240 Minutes  Continuous PRN 02/06/23 1932 02/06/23 1928   02/06/23 0800  ceFEPIme (MAXIPIME) 2 g in sodium chloride 0.9 % 100 mL IVPB        2 g 200 mL/hr over 30 Minutes Intravenous Every 8 hours 02/06/23 0103     02/06/23 0100  ceFEPIme (MAXIPIME) 2 g in sodium chloride 0.9 % 100 mL IVPB        2 g 200 mL/hr over 30 Minutes Intravenous  Once 02/06/23 0006 02/06/23 0051   02/06/23 0100  metroNIDAZOLE (FLAGYL) IVPB 500 mg        500 mg 100 mL/hr over 60 Minutes Intravenous Every 12 hours 02/06/23 0006 02/13/23 0059   02/06/23 0100  vancomycin (VANCOCIN) IVPB 1000 mg/200 mL premix  Status:  Discontinued        1,000 mg 200 mL/hr over 60 Minutes Intravenous  Once 02/06/23 0006 02/06/23 0011   02/06/23 0100  vancomycin (VANCOREADY) IVPB 1750 mg/350 mL        1,750 mg 175 mL/hr over 120 Minutes Intravenous  Once 02/06/23 0011 02/06/23 0443         Note: Portions of this report may have been transcribed using voice recognition software. Every effort was made to ensure accuracy; however, inadvertent computerized transcription errors may be present.   Any transcriptional errors that result from this process are unintentional.    Adin Hector, MD, FACS, MASCRS Esophageal, Gastrointestinal & Colorectal Surgery Robotic and Minimally Invasive Surgery  [facility]. [address], [place],   [address] [phone] Fax [phone] Main  CONTACT INFORMATION:  Weekday (9AM-5PM): Call [facility] main office at [phone]  Weeknight (5PM-9AM) or Weekend/Holiday: Check [email or web address] (password " [email or web address]") for General Surgery [facility] coverage  (Please, do not use SecureChat as it is not reliable communication to reach operating surgeons for immediate  patient care given surgeries/outpatient duties/clinic/cross-coverage/off post-call which would lead to a delay in care.  Epic staff messaging available for outptient concerns, but may not be answered for 48 hours or more).     02/09/2023  8:20 AM

## 2023-02-09 NOTE — Progress Notes (Signed)
PROGRESS NOTE    Jodi Forbes  [id number] DOB: 02/24/1966 DOA: 02/02/2023 PCP: Patient, No Pcp Per    Brief Narrative:   Jodi Forbes is a 57 years old female with past medical history of hepatic adenoma status postsurgical resections in the past at [facility], presented to hospital with sharp abdominal pain with some nausea and vomiting.  Symptoms did not improve so she decided to come to the hospital.  In the ED, patient had generalized abdominal tenderness.  Initial blood pressure was elevated.  CBC showed a WBC of 10.9.  BMP showed creatinine at 0.6.  Lipase of 24.  CT scan of the abdomen and pelvis was done which showed air and fluid filled loops of small bowel in the central left hemiabdomen with appropriate transition with multiple large hypodense liver lesions largest one up to 12.8 x 9.9 cm.  General surgery was consulted and patient was admitted hospital for further evaluation and treatment.  During hospitalization, small bowel obstruction resolved but patient continued to have fever so CT scan of the chest abdomen and pelvis was done which showed intratumoral hemorrhage of the hepatic adenoma and patient underwent IR guided hepatic artery embolization.  Assessment and Plan:   Partial small bowel obstruction. Has improved at this time.  General surgery on board.    Status post Gastrografin study with contrast in the colon..  Clinically improving.  Patient has been advanced to soft diet and has tolerated oral intake.  Has had bowel movement yesterday.  Discontinue IV fluids.  Hepatic adenoma with  intratumoral hemorrhage with epigastric discomfort. Patient had CT scan of the chest abdomen and pelvis yesterday to see any source of the infection since she was having fever but was noted to have a intra tumoral hemorrhage in the hepatic adenoma.  Status post IR guided embolization for acute extravasation from large right hepatic lobe mass.  IR plans to follow-up in 1 month with  Dr Maryelizabeth Kaufmann as outpatient. will continue to monitor hemoglobin levels.  Hemoglobin today at 8.8 from 8.7 <8.8 and has remained stable.  Patient still complains of a 7/10 epigastric discomfort likely secondary to stretching of the liver capsule.  Seen by general surgery.  Oxycodone has been changed to Norco due to nausea.  Robaxin has been added to the regimen.  History of hepatic adenomas Elevated LFT likely from hepatic adenomas.  Largest one at 12.8 x 9.9 cm.  Patient stated that she did have multiple hepatic adenoma resections in the past at [facility] in [place].  Continue analgesia and focus on pain relief.    AFP is less than 1.8.  Patient will need to follow-up with [facility] after [facility].  Encouraged her to make an appointment.  New fever with leukocytosis.   Temperature max of 100.3 F in the last 24 hours.  Leukocytosis with  WBC at 13.3 from 14.7< 15.1 <16.9  initial WBC at 10.9.  Urinalysis negative.   Initial chest x-ray showed some patchy opacities over the right lower lung but a repeat chest x-ray negative for acute infiltrate.  CT chest without any infiltrate.  Continue incentive spirometry, procalcitonin was 0.8.  Blood cultures negative in 3 days.. CT scan of the chest abdomen pelvis without obvious source of infection except for hepatic adenoma with intratumoral hemorrhage.  This could be the source of fever as well.    Patient was on cefepime Flagyl and vancomycin.. Will discontinue antibiotic at this time and monitor without it.  MRSA PCR positive.  COVID  influenza and RSV negative..   Lactate was 0.7.  Urinalysis with 6-10 white cells only.    Hypokalemia mild.  Will continue to replenish.  Check levels in AM.   DVT prophylaxis: SCDs Start: 02/02/23 1703    Code Status:     Code Status: Full Code  Disposition:  Likely home 1 to 2 days pending upon clinical improvement and surgical input.  Status is: Inpatient  Remains inpatient appropriate because: Resolving  small bowel obstruction Fever with leukocytosis,, status post IR embolization, pending clinical improvement.   Family Communication:  Spoke with the patient's mother at bedside on 02/08/2023, I spoke with the patient's daughter at bedside 2/ 11/ 24  Consultants:  General surgery Interventional radiology  Procedures:  NG tube placement and removal Interventional radiology - celiac and right hepatic arteriography with embolization on 02/06/2023.  Antimicrobials:  Vancomycin, cefepime and Flagyl.  Will discontinue antibiotics.  Anti-infectives (From admission, onward)    Start     Dose/Rate Route Frequency Ordered Stop   02/07/23 0200  vancomycin (VANCOREADY) IVPB 1250 mg/250 mL  Status:  Discontinued        1,250 mg 166.7 mL/hr over 90 Minutes Intravenous Every 24 hours 02/06/23 0334 02/08/23 0825   02/06/23 1928  piperacillin-tazobactam (ZOSYN) IVPB        over 240 Minutes  Continuous PRN 02/06/23 1932 02/06/23 1928   02/06/23 0800  ceFEPIme (MAXIPIME) 2 g in sodium chloride 0.9 % 100 mL IVPB        2 g 200 mL/hr over 30 Minutes Intravenous Every 8 hours 02/06/23 0103     02/06/23 0100  ceFEPIme (MAXIPIME) 2 g in sodium chloride 0.9 % 100 mL IVPB        2 g 200 mL/hr over 30 Minutes Intravenous  Once 02/06/23 0006 02/06/23 0051   02/06/23 0100  metroNIDAZOLE (FLAGYL) IVPB 500 mg        500 mg 100 mL/hr over 60 Minutes Intravenous Every 12 hours 02/06/23 0006 02/13/23 0059   02/06/23 0100  vancomycin (VANCOCIN) IVPB 1000 mg/200 mL premix  Status:  Discontinued        1,000 mg 200 mL/hr over 60 Minutes Intravenous  Once 02/06/23 0006 02/06/23 0011   02/06/23 0100  vancomycin (VANCOREADY) IVPB 1750 mg/350 mL        1,750 mg 175 mL/hr over 120 Minutes Intravenous  Once 02/06/23 0011 02/06/23 0443      Subjective:  Today, patient was seen and examined at bedside.  Complains of pressure-like sensation over the epigastric region around 6 x 10 in intensity.  Has had a bowel movement  yesterday.  Tolerating oral diet.  Temperature max of 100.3 F.  Objective: Vitals:   02/09/23 1149 02/09/23 1200 02/09/23 1504 02/09/23 1600  BP:      Pulse:  92    Resp:  20  15  Temp: 99.3 F (37.4 C)  98.9 F (37.2 C)   TempSrc: Oral  Oral   SpO2:  94%    Weight:      Height:        Intake/Output Summary (Last 24 hours) at 02/09/2023 1624 Last data filed at 02/09/2023 0910 Gross per 24 hour  Intake 2147.36 ml  Output --  Net 2147.36 ml    Filed Weights   02/02/23 1253 02/06/23 2019  Weight: 86.2 kg 83.1 kg    Physical Examination: Body mass index is 31.45 kg/m.   General: Alert awake and Communicative, not in obvious distress, obese  HENT:   No scleral pallor or icterus noted. Oral mucosa is moist.  Chest:  .  Diminished breath sounds bilaterally.  CVS: S1 &S2 heard. No murmur.  Regular rate and rhythm. Abdomen: Soft, mildly tender around the epigastric area,   extremities: No cyanosis, clubbing or edema.  Peripheral pulses are palpable. Psych: Alert, awake and oriented, normal mood CNS:  No cranial nerve deficits.  Power equal in all extremities.   Skin: Warm and dry.  No rashes noted.  Data Reviewed:   CBC: Recent Labs  Lab 02/06/23 0106 02/06/23 1615 02/07/23 0505 02/07/23 0942 02/07/23 1539 02/08/23 0316 02/09/23 0335  WBC 16.9*   < > 13.7* 15.1* 14.7* 14.7* 13.3*  NEUTROABS 13.0*  --   --   --   --   --   --   HGB 9.6*   < > 8.8* 8.9* 9.2* 8.7* 8.8*  HCT 30.7*   < > 27.7* 29.3* 28.6* 27.4* 27.5*  MCV 94.2   < > 92.3 96.4 90.2 92.3 91.7  PLT 270   < > 234 269 244 259 292   < > = values in this interval not displayed.     Basic Metabolic Panel: Recent Labs  Lab 02/05/23 0451 02/06/23 0106 02/06/23 0118 02/07/23 0505 02/08/23 0316 02/09/23 0335  NA 141 136  --  137 134* 135  K 3.2* 3.3*  --  3.0* 3.9 3.3*  CL 107 105  --  101 102 105  CO2 24 22  --  22 20* 22  GLUCOSE 114* 102*  --  83 100* 91  BUN 21* 12  --  8 5* 6  CREATININE 0.57  0.55  --  0.51 0.51 0.43*  CALCIUM 8.5* 8.0*  --  7.9* 7.7* 7.6*  MG 2.3  --  1.7 1.9 1.9 2.1     Liver Function Tests: Recent Labs  Lab 02/03/23 0419 02/06/23 0106 02/08/23 0316  AST 44* 119* 145*  ALT 35 281* 168*  ALKPHOS 230* 231* 172*  BILITOT 1.0 1.4* 1.0  PROT 7.4 6.3* 6.0*  ALBUMIN 3.5 2.6* 2.2*      Radiology Studies: No results found.    LOS: 7 days    Flora Lipps, MD [facility] Available via Epic secure chat 7am-7pm After these hours, please refer to coverage provider listed on [email or web address] 02/09/2023, 4:24 PM

## 2023-02-10 DIAGNOSIS — K5669 Other partial intestinal obstruction: Secondary | ICD-10-CM | POA: Diagnosis not present

## 2023-02-10 DIAGNOSIS — K7689 Other specified diseases of liver: Secondary | ICD-10-CM | POA: Diagnosis not present

## 2023-02-10 DIAGNOSIS — R5081 Fever presenting with conditions classified elsewhere: Secondary | ICD-10-CM | POA: Diagnosis not present

## 2023-02-10 DIAGNOSIS — F411 Generalized anxiety disorder: Secondary | ICD-10-CM | POA: Diagnosis not present

## 2023-02-10 DIAGNOSIS — D134 Benign neoplasm of liver: Secondary | ICD-10-CM | POA: Diagnosis not present

## 2023-02-10 DIAGNOSIS — Z22322 Carrier or suspected carrier of Methicillin resistant Staphylococcus aureus: Secondary | ICD-10-CM

## 2023-02-10 DIAGNOSIS — R748 Abnormal levels of other serum enzymes: Secondary | ICD-10-CM | POA: Diagnosis not present

## 2023-02-10 DIAGNOSIS — K56600 Partial intestinal obstruction, unspecified as to cause: Secondary | ICD-10-CM | POA: Diagnosis not present

## 2023-02-10 LAB — CBC
HCT: 28.5 % — ABNORMAL LOW (ref 36.0–46.0)
Hemoglobin: 8.9 g/dL — ABNORMAL LOW (ref 12.0–15.0)
MCH: 29.2 pg (ref 26.0–34.0)
MCHC: 31.2 g/dL (ref 30.0–36.0)
MCV: 93.4 fL (ref 80.0–100.0)
Platelets: 333 10*3/uL (ref 150–400)
RBC: 3.05 MIL/uL — ABNORMAL LOW (ref 3.87–5.11)
RDW: 12.6 % (ref 11.5–15.5)
WBC: 14.7 10*3/uL — ABNORMAL HIGH (ref 4.0–10.5)
nRBC: 0 % (ref 0.0–0.2)

## 2023-02-10 LAB — BASIC METABOLIC PANEL
Anion gap: 11 (ref 5–15)
BUN: <5 mg/dL — ABNORMAL LOW (ref 6–20)
CO2: 21 mmol/L — ABNORMAL LOW (ref 22–32)
Calcium: 7.9 mg/dL — ABNORMAL LOW (ref 8.9–10.3)
Chloride: 105 mmol/L (ref 98–111)
Creatinine, Ser: 0.43 mg/dL — ABNORMAL LOW (ref 0.44–1.00)
GFR, Estimated: >60 mL/min (ref >60)
Glucose, Bld: 89 mg/dL (ref 70–99)
Potassium: 4 mmol/L (ref 3.5–5.1)
Sodium: 137 mmol/L (ref 135–145)

## 2023-02-10 LAB — MAGNESIUM: Magnesium: 1.8 mg/dL (ref 1.7–2.4)

## 2023-02-10 MED ORDER — ONDANSETRON HCL 4 MG PO TABS: 4.0000 mg | ORAL_TABLET | Freq: Four times a day (QID) | ORAL | 0 refills | Status: AC | PRN

## 2023-02-10 MED ORDER — ACETAMINOPHEN 325 MG PO TABS: 650.0000 mg | ORAL_TABLET | Freq: Four times a day (QID) | ORAL | Status: AC | PRN

## 2023-02-10 MED ORDER — CALCIUM POLYCARBOPHIL 625 MG PO TABS: 625.0000 mg | ORAL_TABLET | Freq: Two times a day (BID) | ORAL | 0 refills | Status: AC

## 2023-02-10 MED ORDER — ALUM & MAG HYDROXIDE-SIMETH 200-200-20 MG/5ML PO SUSP: 30.0000 mL | Freq: Four times a day (QID) | ORAL | 0 refills | Status: AC | PRN

## 2023-02-10 MED ORDER — SIMETHICONE 80 MG PO CHEW: 80.0000 mg | CHEWABLE_TABLET | Freq: Four times a day (QID) | ORAL | 0 refills | Status: AC | PRN

## 2023-02-10 MED ORDER — HYDROCODONE-ACETAMINOPHEN 5-325 MG PO TABS: 1.0000 | ORAL_TABLET | ORAL | 0 refills | Status: AC | PRN

## 2023-02-10 MED ORDER — METHOCARBAMOL 1000 MG PO TABS: 500.0000 mg | ORAL_TABLET | Freq: Three times a day (TID) | ORAL | 0 refills | Status: AC | PRN

## 2023-02-10 MED ORDER — POLYETHYLENE GLYCOL 3350 17 G PO PACK: 17.0000 g | PACK | Freq: Every day | ORAL | 0 refills | Status: AC | PRN

## 2023-02-10 NOTE — Progress Notes (Signed)
Progress Note     Subjective: Tolerated soft diet with no issues.  No N/V, had a BM this morning.  Some abdominal discomfort over liver as expected.  Objective: Vital signs in last 24 hours: Temp:  [98.9 F (37.2 C)-101.5 F (38.6 C)] 99.6 F (37.6 C) (02/12 0800) Pulse Rate:  [92] 92 (02/11 1200) Resp:  [0-43] 0 (02/12 0600) BP: (121-143)/(70-78) 143/78 (02/12 0600) SpO2:  [94 %] 94 % (02/11 1200) Last BM Date : 02/08/23  Intake/Output from previous day: 02/11 0701 - 02/12 0700 In: 2826 [P.O.:480; I.V.:1547.4; IV Piggyback:798.6] Out: 100 [Emesis/NG output:100] Intake/Output this shift: No intake/output data recorded.  PE: Gen:  Alert, NAD, pleasant Abd: mild tenderness over RUQ as expected, +BS, ND Psych: A&Ox3    Lab Results:  Recent Labs    02/09/23 0335 02/10/23 0303  WBC 13.3* 14.7*  HGB 8.8* 8.9*  HCT 27.5* 28.5*  PLT 292 333   BMET Recent Labs    02/09/23 0335 02/10/23 0303  NA 135 137  K 3.3* 4.0  CL 105 105  CO2 22 21*  GLUCOSE 91 89  BUN 6 <5*  CREATININE 0.43* 0.43*  CALCIUM 7.6* 7.9*   PT/INR No results for input(s): "LABPROT", "INR" in the last 72 hours.  CMP     Component Value Date/Time   NA 137 02/10/2023 0303   K 4.0 02/10/2023 0303   CL 105 02/10/2023 0303   CO2 21 (L) 02/10/2023 0303   GLUCOSE 89 02/10/2023 0303   BUN <5 (L) 02/10/2023 0303   CREATININE 0.43 (L) 02/10/2023 0303   CALCIUM 7.9 (L) 02/10/2023 0303   PROT 6.0 (L) 02/08/2023 0316   ALBUMIN 2.2 (L) 02/08/2023 0316   AST 145 (H) 02/08/2023 0316   ALT 168 (H) 02/08/2023 0316   ALKPHOS 172 (H) 02/08/2023 0316   BILITOT 1.0 02/08/2023 0316   GFRNONAA >60 02/10/2023 0303   GFRAA >60 01/08/2019 0609   Lipase     Component Value Date/Time   LIPASE 24 02/02/2023 1254       Studies/Results: No results found.  Anti-infectives: Anti-infectives (From admission, onward)    Start     Dose/Rate Route Frequency Ordered Stop   02/07/23 0200  vancomycin  (VANCOREADY) IVPB 1250 mg/250 mL  Status:  Discontinued        1,250 mg 166.7 mL/hr over 90 Minutes Intravenous Every 24 hours 02/06/23 0334 02/08/23 0825   02/06/23 1928  piperacillin-tazobactam (ZOSYN) IVPB        over 240 Minutes  Continuous PRN 02/06/23 1932 02/06/23 1928   02/06/23 0800  ceFEPIme (MAXIPIME) 2 g in sodium chloride 0.9 % 100 mL IVPB  Status:  Discontinued        2 g 200 mL/hr over 30 Minutes Intravenous Every 8 hours 02/06/23 0103 02/09/23 1630   02/06/23 0100  ceFEPIme (MAXIPIME) 2 g in sodium chloride 0.9 % 100 mL IVPB        2 g 200 mL/hr over 30 Minutes Intravenous  Once 02/06/23 0006 02/06/23 0051   02/06/23 0100  metroNIDAZOLE (FLAGYL) IVPB 500 mg  Status:  Discontinued        500 mg 100 mL/hr over 60 Minutes Intravenous Every 12 hours 02/06/23 0006 02/09/23 1630   02/06/23 0100  vancomycin (VANCOCIN) IVPB 1000 mg/200 mL premix  Status:  Discontinued        1,000 mg 200 mL/hr over 60 Minutes Intravenous  Once 02/06/23 0006 02/06/23 0011   02/06/23 0100  vancomycin (  VANCOREADY) IVPB 1750 mg/350 mL        1,750 mg 175 mL/hr over 120 Minutes Intravenous  Once 02/06/23 0011 02/06/23 0443        Assessment/Plan  SBO Hepatic adenoma with hemorrhage - resolving SBO -no surgical intervention needed. -surgically stable for DC home.  Relayed to primary service -she may follow up with physicians as [facility] for further management and care of her hepatic adenoma. -we will sign off at this time.  FEN - soft diet  VTE - SCDs ID - none currently    LOS: 8 days   I reviewed hospitalist notes, last 24 h vitals and pain scores, last 48 h intake and output, last 24 h labs and trends, and last 24 h imaging results.    Henreitta Cea, [facility] Surgery 02/10/2023, 8:42 AM Please see Amion for pager number during day hours 7:00am-4:30pm

## 2023-02-10 NOTE — Discharge Summary (Addendum)
Physician Discharge Summary  Jodi Forbes [id number] DOB: 20-Dec-1966 DOA: 02/02/2023  PCP: Patient, No Pcp Per  Admit date: 02/02/2023 Discharge date: 02/10/2023  Admitted From: Home  Discharge disposition: Home   Recommendations for Outpatient Follow-Up:   Follow up with your primary care provider in one week.  Check CBC, LFT MP, magnesium in the next visit Follow-up with general surgery at [facility] as outpatient in 1-2 weeks  for further discussion on hepatic adenoma. Follow-up with interventional radiology as scheduled in 1 month for post IR intervention.Marland Kitchen  Discharge Diagnosis:   Principal Problem:   Partial small bowel obstruction (HCC) Active Problems:   Hepatocellular adenoma   Anxiety state   Elevated alkaline phosphatase level   Liver hemorrhage   Fever   MRSA (methicillin resistant Staphylococcus aureus) colonization   Discharge Condition: Improved.  Diet recommendation: Low sodium, heart healthy.    Wound care: None.  Code status: Full.   History of Present Illness:   Jodi Forbes is a 57 years old female with past medical history of hepatic adenoma status postsurgical resections in the past at [facility], presented to hospital with sharp abdominal pain with some nausea and vomiting.  Symptoms did not improve so she decided to come to the hospital.  In the ED, patient had generalized abdominal tenderness.  Initial blood pressure was elevated.  CBC showed a WBC of 10.9.  BMP showed creatinine at 0.6.  Lipase of 24.  CT scan of the abdomen and pelvis was done which showed air and fluid filled loops of small bowel in the central left hemiabdomen with appropriate transition with multiple large hypodense liver lesions largest one up to 12.8 x 9.9 cm.  General surgery was consulted and patient was admitted hospital for further evaluation and treatment.During hospitalization, small bowel obstruction resolved but patient continued to have fever so CT scan of  the chest abdomen and pelvis was done which showed intratumoral hemorrhage of the hepatic adenoma and patient underwent IR guided hepatic artery embolization.   Hospital Course:   Following conditions were addressed during hospitalization as listed below,  Partial small bowel obstruction. Has improved at this time.  General surgery was consulted and patient underwent Gastrografin study with contrast in the colon..  Patient was gradually advanced on diet which she tolerated with resumption of bowel movements.  hepatic adenoma with  intratumoral hemorrhage with epigastric discomfort. Patient had CT scan of the chest abdomen and pelvis during hospitalization for the second time to see source of infection since she was having fever.  Patient was noted to have  intra tumoral hemorrhage in the hepatic adenoma.  Patient then underwent IR guided embolization for acute extravasation from large right hepatic lobe mass.  IR plans to follow-up in 1 month with Dr Jodi Forbes as outpatient.  Patient still complains epigastric discomfort likely secondary to stretching of the liver capsule.  Seen by general surgery today and at this time patient has been considered stable for disposition home from surgical point of view.  Will continue with analgesics and muscle relaxants on discharge.   History of hepatic adenomas Elevated LFT likely from hepatic adenomas.  Largest one at 12.8 x 9.9 cm.  Patient stated that she did have multiple hepatic adenoma resections in the past at [facility] in [place].     AFP is less than 1.8.  Patient will need to [facility].  Encouraged her to make an appointment.   Fever with leukocytosis.   During hospitalization.  No infective source was found despite extensive workup.  Thought to be secondary to hepatic adenoma with hemorrhage.  Was empirically on broad-spectrum antibiotics which has been discontinued at this time.  Procalcitonin was 0.8.   Blood cultures were negative.  Patient was advised as needed Tylenol.  Hypokalemia mild.  Improved after replacement.  Disposition.  At this time, patient is stable for disposition home with outpatient PCP/IR/hepatic surgeon follow-up.  Medical Consultants:   General surgery Interventional radiology  Procedures:    NG tube placement and removal Interventional radiology - celiac and right hepatic arteriography with embolization on 02/06/2023.  Subjective:   Today, patient was seen and examined at bedside.  Complains of mild epigastric discomfort but overall okay.  Has been eating okay.  Having bowel movements.  Seen by general surgery and okay for discharge.  Discharge Exam:   Vitals:   02/10/23 0800 02/10/23 0900  BP: (!) 140/71   Pulse:    Resp: (!) 23 14  Temp: 99.6 F (37.6 C)   SpO2:     Vitals:   02/10/23 0600 02/10/23 0700 02/10/23 0800 02/10/23 0900  BP: (!) 143/78  (!) 140/71   Pulse:      Resp: (!) 0 (!) 30 (!) 23 14  Temp:   99.6 F (37.6 C)   TempSrc:   Oral   SpO2:      Weight:      Height:       General: Alert awake, not in obvious distress HENT: pupils equally reacting to light,  No scleral pallor or icterus noted. Oral mucosa is moist.  Chest:  Clear breath sounds.  Diminished breath sounds bilaterally. No crackles or wheezes.  CVS: S1 &S2 heard. No murmur.  Regular rate and rhythm. Abdomen: Soft, mild epigastric discomfort on palpation.  Bowel sounds are heard.   Extremities: No cyanosis, clubbing or edema.  Peripheral pulses are palpable. Psych: Alert, awake and oriented, normal mood CNS:  No cranial nerve deficits.  Power equal in all extremities.   Skin: Warm and dry.  No rashes noted.  The results of significant diagnostics from this hospitalization (including imaging, microbiology, ancillary and laboratory) are listed below for reference.     Diagnostic Studies:   DG Abd Portable 1V-Small Bowel Obstruction Protocol-initial, 8 hr  delay  Result Date: 02/03/2023 CLINICAL DATA:  Small-bowel obstruction, 8 hour delay. EXAM: PORTABLE ABDOMEN - 1 VIEW COMPARISON:  None Available. FINDINGS: The bowel gas pattern is normal. Enteric tube terminates in the stomach. Calcified fibroid is noted in the pelvis. No radio-opaque calculi or other significant radiographic abnormality are seen. IMPRESSION: 1. No evidence of small-bowel obstruction. 2. NG tube terminates in the stomach. Electronically Signed   By: Brett Fairy M.D.   On: 02/03/2023 02:36   DG Abdomen 1 View  Result Date: 02/02/2023 CLINICAL DATA:  NG tube placement EXAM: ABDOMEN - 1 VIEW COMPARISON:  02/02/23 FINDINGS: 0 there is been interval placement of in the anterior to. The tip and side port are well below the GE junction. Tip is in the expected location of the distal stomach. Dilated loop of small bowel is noted within the imaged portion of the mid abdomen. IMPRESSION: Interval placement of NG tube with tip in the distal stomach. Electronically Signed   By: Kerby Moors M.D.   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 EXAM:  CT ABDOMEN AND PELVIS WITH CONTRAST TECHNIQUE: Multidetector CT imaging of the abdomen and pelvis was performed using the standard protocol following bolus administration of intravenous contrast. RADIATION DOSE REDUCTION: This exam was performed according to the departmental dose-optimization program which includes automated exposure control, adjustment of the mA and/or kV according to patient size and/or use of iterative reconstruction technique. CONTRAST:  1106m OMNIPAQUE IOHEXOL 300 MG/ML  SOLN COMPARISON:  03/26/2021 FINDINGS: Lower chest: No acute abnormality. Hepatobiliary: Multiple large, hypodense liver lesions are not appreciably changed, largest again in the liver dome measuring 12.8 x 9.9 cm (series 2, image  16). Evidence of prior left and right partial resections. Status post cholecystectomy. No biliary ductal dilatation. Pancreas: Unremarkable. No pancreatic ductal dilatation or surrounding inflammatory changes. Spleen: Normal in size without significant abnormality. Adrenals/Urinary Tract: Adrenal glands are unremarkable. Kidneys are normal, without renal calculi, solid lesion, or hydronephrosis. Bladder is unremarkable. Stomach/Bowel: Stomach is within normal limits. Appendix appears normal. Air and fluid-filled loop of mid small bowel in the central left hemiabdomen, with an abrupt transition point in the midline ventral abdomen (series 2, image 57, series 5, image 67). This loop measures no greater than 3.0 cm in caliber but appears mildly distended. Vascular/Lymphatic: No significant vascular findings are present. No enlarged abdominal or pelvic lymph nodes. Reproductive: Uterine fibroids. Other: No abdominal wall hernia or abnormality. No ascites. Musculoskeletal: No acute or significant osseous findings. IMPRESSION: 1. Air and fluid-filled loop of mid small bowel in the central left hemiabdomen, with an abrupt transition point in the midline ventral abdomen. This loop measures no greater than 3.0 cm in caliber but appears mildly distended. Findings are consistent with partial or developing small bowel obstruction. 2. Multiple large, hypodense liver lesions are not appreciably changed, largest again in the liver dome measuring 12.8 x 9.9 cm. Evidence of prior left and right partial resections. By report, these have been previously characterized as hepatic adenomas, and there is no evidence of complicating hemorrhage or other acute associated finding at this time. 3. Uterine fibroids. Electronically Signed   By: ADelanna AhmadiM.D.   On: 02/02/2023 14:33     Labs:   Basic Metabolic Panel: Recent Labs  Lab 02/06/23 0106 02/06/23 0118 02/07/23 0505 02/08/23 0316 02/09/23 0335 02/10/23 0303  NA 136  --   137 134* 135 137  K 3.3*  --  3.0* 3.9 3.3* 4.0  CL 105  --  101 102 105 105  CO2 22  --  22 20* 22 21*  GLUCOSE 102*  --  83 100* 91 89  BUN 12  --  8 5* 6 <5*  CREATININE 0.55  --  0.51 0.51 0.43* 0.43*  CALCIUM 8.0*  --  7.9* 7.7* 7.6* 7.9*  MG  --  1.7 1.9 1.9 2.1 1.8   GFR Estimated Creatinine Clearance: 82.9 mL/min (A) (by C-G formula based on SCr of 0.43 mg/dL (L)). Liver Function Tests: Recent Labs  Lab 02/06/23 0106 02/08/23 0316  AST 119* 145*  ALT 281* 168*  ALKPHOS 231* 172*  BILITOT 1.4* 1.0  PROT 6.3* 6.0*  ALBUMIN 2.6* 2.2*   No results for input(s): "LIPASE", "AMYLASE" in the last 168 hours. No results for input(s): "AMMONIA" in the last 168 hours. Coagulation profile Recent Labs  Lab 02/06/23 0106  INR 1.4*    CBC: Recent Labs  Lab 02/06/23 0106 02/06/23 1615 02/07/23 0942 02/07/23 1539 02/08/23 0316 02/09/23 0335 02/10/23 0303  WBC 16.9*   < > 15.1* 14.7*  14.7* 13.3* 14.7*  NEUTROABS 13.0*  --   --   --   --   --   --   HGB 9.6*   < > 8.9* 9.2* 8.7* 8.8* 8.9*  HCT 30.7*   < > 29.3* 28.6* 27.4* 27.5* 28.5*  MCV 94.2   < > 96.4 90.2 92.3 91.7 93.4  PLT 270   < > 269 244 259 292 333   < > = values in this interval not displayed.   Cardiac Enzymes: No results for input(s): "CKTOTAL", "CKMB", "CKMBINDEX", "TROPONINI" in the last 168 hours. BNP: Invalid input(s): "POCBNP" CBG: Recent Labs  Lab 02/09/23 1512  GLUCAP 92   D-Dimer No results for input(s): "DDIMER" in the last 72 hours. Hgb A1c No results for input(s): "HGBA1C" in the last 72 hours. Lipid Profile No results for input(s): "CHOL", "HDL", "LDLCALC", "TRIG", "CHOLHDL", "LDLDIRECT" in the last 72 hours. Thyroid function studies No results for input(s): "TSH", "T4TOTAL", "T3FREE", "THYROIDAB" in the last 72 hours.  Invalid input(s): "FREET3" Anemia work up No results for input(s): "VITAMINB12", "FOLATE", "FERRITIN", "TIBC", "IRON", "RETICCTPCT" in the last 72  hours. Microbiology Recent Results (from the past 240 hour(s))  Culture, blood (x 2)     Status: None (Preliminary result)   Collection Time: 02/06/23  1:06 AM   Specimen: BLOOD  Result Value Ref Range Status   Specimen Description   Final    BLOOD BLOOD RIGHT ARM Performed at [facility] [address], [place], [place] [address]    Special Requests   Final    BOTTLES DRAWN AEROBIC AND ANAEROBIC Blood Culture adequate volume Performed at [facility] [address], [place], [place] [address]    Culture   Final    NO GROWTH 4 DAYS Performed at [facility], [place] [address], [place], [place] [address]    Report Status PENDING  Incomplete  Culture, blood (x 2)     Status: None (Preliminary result)   Collection Time: 02/06/23  1:18 AM   Specimen: BLOOD  Result Value Ref Range Status   Specimen Description   Final    BLOOD BLOOD RIGHT HAND Performed at [facility] [address], [place], [place] [address]    Special Requests   Final    BOTTLES DRAWN AEROBIC ONLY Blood Culture adequate volume Performed at [facility] [address], [place], [place] [address]    Culture   Final    NO GROWTH 4 DAYS Performed at [facility], [place] [address], [place],  [address]    Report Status PENDING  Incomplete  Resp panel by RT-PCR (RSV, Flu A&B, Covid) Urine, Clean Catch     Status: None   Collection Time: 02/06/23 10:43 AM   Specimen: Urine, Clean Catch; Nasal Swab  Result Value Ref Range Status   SARS Coronavirus 2 by RT PCR NEGATIVE NEGATIVE Final    Comment: (NOTE) SARS-CoV-2 target nucleic acids are NOT DETECTED.  The SARS-CoV-2 RNA is generally detectable in upper respiratory specimens during the acute phase of infection. The lowest concentration of SARS-CoV-2 viral copies this assay can detect is 138 copies/mL. A negative result does not preclude SARS-Cov-2 infection and should  not be used as the sole basis for treatment or other patient management decisions. A negative result may occur with  improper specimen collection/handling, submission of specimen other than nasopharyngeal swab, presence of viral mutation(s) within the areas targeted by this assay,  and inadequate number of viral copies(<138 copies/mL). A negative result must be combined with clinical observations, patient history, and epidemiological information. The expected result is Negative.  Fact Sheet for Patients:  EntrepreneurPulse.com.au  Fact Sheet for Healthcare Providers:  IncredibleEmployment.be  This test is no t yet approved or cleared by the [place] FDA and  has been authorized for detection and/or diagnosis of SARS-CoV-2 by FDA under an Emergency Use Authorization (EUA). This EUA will remain  in effect (meaning this test can be used) for the duration of the COVID-19 declaration under Section 564(b)(1) of the Act, 21 U.S.C.section 360bbb-3(b)(1), unless the authorization is terminated  or revoked sooner.       Influenza A by PCR NEGATIVE NEGATIVE Final   Influenza B by PCR NEGATIVE NEGATIVE Final    Comment: (NOTE) The Xpert Xpress SARS-CoV-2/FLU/RSV plus assay is intended as an aid in the diagnosis of influenza from Nasopharyngeal swab specimens and should not be used as a sole basis for treatment. Nasal washings and aspirates are unacceptable for Xpert Xpress SARS-CoV-2/FLU/RSV testing.  Fact Sheet for Patients: EntrepreneurPulse.com.au  Fact Sheet for Healthcare Providers: IncredibleEmployment.be  This test is not yet approved or cleared by the [place] FDA and has been authorized for detection and/or diagnosis of SARS-CoV-2 by FDA under an Emergency Use Authorization (EUA). This EUA will remain in effect (meaning this test can be used) for the duration of the COVID-19 declaration under  Section 564(b)(1) of the Act, 21 U.S.C. section 360bbb-3(b)(1), unless the authorization is terminated or revoked.     Resp Syncytial Virus by PCR NEGATIVE NEGATIVE Final    Comment: (NOTE) Fact Sheet for Patients: EntrepreneurPulse.com.au  Fact Sheet for Healthcare Providers: IncredibleEmployment.be  This test is not yet approved or cleared by the [place] FDA and has been authorized for detection and/or diagnosis of SARS-CoV-2 by FDA under an Emergency Use Authorization (EUA). This EUA will remain in effect (meaning this test can be used) for the duration of the COVID-19 declaration under Section 564(b)(1) of the Act, 21 U.S.C. section 360bbb-3(b)(1), unless the authorization is terminated or revoked.  Performed at [facility], [place] [address] [place], [place] [address]   MRSA Next Gen by PCR, Nasal     Status: Abnormal   Collection Time: 02/06/23  7:22 PM   Specimen: Nasal Mucosa; Nasal Swab  Result Value Ref Range Status   MRSA by PCR Next Gen DETECTED (A) NOT DETECTED Final    Comment: (NOTE) The GeneXpert MRSA Assay (FDA approved for NASAL specimens only), is one component of a comprehensive MRSA colonization surveillance program. It is not intended to diagnose MRSA infection nor to guide or monitor treatment for MRSA infections. Test performance is not FDA approved in patients less than 78 years old. Performed at [facility], [place] [address], [place], [place] [address]      Discharge Instructions:   Discharge Instructions     Call MD for:  persistant nausea and vomiting   Complete by: As directed    Call MD for:  severe uncontrolled pain   Complete by: As directed    Diet general   Complete by: As directed    Soft diet and advance as tolerated   Discharge instructions   Complete by: As directed    Follow up with your primary care physician in one week. Check blood work at that  time. Follow-up with your liver surgeon and as soon as possible to discuss about liver adenoma.  Follow-up with interventional radiology as scheduled by the clinic.   Take Tylenol for fever.  If you experience worsening symptoms please seek medical attention.   Increase activity slowly   Complete by: As directed    No wound care   Complete by: As directed       Allergies as of 02/10/2023   No Known Allergies      Medication List     STOP taking these medications    ceFEPIme 1 g in sodium chloride 0.9 % 100 mL   hydrALAZINE 20 MG/ML injection Commonly known as: APRESOLINE   HYDROmorphone 1 MG/ML injection Commonly known as: DILAUDID   levalbuterol 0.63 MG/3ML nebulizer solution Commonly known as: XOPENEX   LORazepam 0.5 MG tablet Commonly known as: ATIVAN   mouth rinse Liqd solution   oxyCODONE 5 MG immediate release tablet Commonly known as: Oxy IR/ROXICODONE   pantoprazole 40 MG injection Commonly known as: PROTONIX   senna-docusate 8.6-50 MG tablet Commonly known as: Senokot-S       TAKE these medications    acetaminophen 325 MG tablet Commonly known as: TYLENOL Take 2 tablets (650 mg total) by mouth every 6 (six) hours as needed for mild pain, fever, moderate pain or headache (or Fever >/= 101). What changed: reasons to take this   alum & mag hydroxide-simeth 200-200-20 MG/5ML suspension Commonly known as: MAALOX/MYLANTA Take 30 mLs by mouth every 6 (six) hours as needed for indigestion or heartburn (or bloating).   HYDROcodone-acetaminophen 5-325 MG tablet Commonly known as: NORCO/VICODIN Take 1-2 tablets by mouth every 4 (four) hours as needed for moderate pain or severe pain.   Methocarbamol 1000 MG Tabs Take 500 mg by mouth every 8 (eight) hours as needed for muscle spasms.   ondansetron 4 MG tablet Commonly known as: ZOFRAN Take 1 tablet (4 mg total) by mouth every 6 (six) hours as needed for nausea.   polycarbophil 625 MG tablet Commonly  known as: FIBERCON Take 1 tablet (625 mg total) by mouth 2 (two) times daily for 14 days.   polyethylene glycol 17 g packet Commonly known as: MIRALAX / GLYCOLAX Take 17 g by mouth daily as needed for mild constipation.   simethicone 80 MG chewable tablet Commonly known as: MYLICON Chew 1 tablet (80 mg total) by mouth every 6 (six) hours as needed for flatulence.        Follow-up Information     primary care provider Follow up in 1 week(s).          Mugweru, Jon, MD Follow up in 1 month(s).   Specialties: Interventional Radiology, Diagnostic Radiology, Radiology Why: status post IR embolization Contact information: [address] [place] [address] [place] [address] [phone]                  Time coordinating discharge: 39 minutes  Signed:  Alexus Michael  [facility] 02/10/2023, 3:57 PM

## 2023-02-11 LAB — CULTURE, BLOOD (ROUTINE X 2)
Culture: NO GROWTH
Culture: NO GROWTH
Special Requests: ADEQUATE
Special Requests: ADEQUATE

## 2023-03-04 DIAGNOSIS — C801 Malignant (primary) neoplasm, unspecified: Secondary | ICD-10-CM | POA: Diagnosis not present

## 2023-03-04 DIAGNOSIS — C787 Secondary malignant neoplasm of liver and intrahepatic bile duct: Secondary | ICD-10-CM | POA: Diagnosis not present

## 2023-03-06 ENCOUNTER — Ambulatory Visit
Admission: RE | Admit: 2023-03-06 | Discharge: 2023-03-06 | Disposition: A | Discharge status: Home / Self Care | Payer: Medicaid Other | Source: Ambulatory Visit | Attending: Radiology | Admitting: Radiology

## 2023-03-06 DIAGNOSIS — D1803 Hemangioma of intra-abdominal structures: Secondary | ICD-10-CM

## 2023-03-06 DIAGNOSIS — K7689 Other specified diseases of liver: Secondary | ICD-10-CM | POA: Diagnosis not present

## 2023-03-06 HISTORY — PX: IR RADIOLOGIST EVAL & MGMT: IMG5224

## 2023-03-06 NOTE — Progress Notes (Signed)
Reason for visit: follow up s/p embolization for hepatic adenoma  Care Team(s): Primary Care: Nicholaus Corolla, MD Hepatobiliary Surgery: Lynetta Mare, MD and Ovidio Kin, NP   History of Present Illness:  Jodi Forbes is a 57 y.o. female known to VIR service s/p embolization of hemorrhagic R hepatic lobe mass (02/06/23) who presents to clinic in routine follow up. Pt is reports that she was diagnosed with multiple hepatic adenomas in 12/2018 and was followed closely by [facility] surgery in [place] (Dr Nino Glow).  On (brief) chart review, liver mass Bx performed on [id number] complicated by hemorrhage. Pt transferred to [facility] for definitive management and underwent pre operative embolization on 01/10/19. She then underwent liver mass resections, including a L lateral hepatectomy and partial R liver resection on 01/21/19 with final pathology consistent with adenoma. She was followed with imaging surveillance until 06/03/2019 at which point she may have been lost to follow up.   She presented to [facility] with abdominal discomfort on 02/02/23 and was diagnosed with SBO. She had endorsed similar discomfort to her 2020 presentation. Follow up imaging for SBO was revealing for spontaneous hemorrhage for her largest mass within her R liver and urgent embolization with performed by me. She was discharged after a 7day LOS from presentation after resolution of her SBO and with no additional concern of bleeding.  She reports that she has since re-established care with [facility] surgery in [place], being managed by Dr Linard Millers and Ovidio Kin, NP. She is scheduled for a follow up scan in 03/2023. She denies any discomfort and is back to her usual state of health.  Review of Systems: A 12-point ROS discussed, and pertinent positives are indicated in the HPI above.  All other systems are negative.   Past Medical History:  Diagnosis Date   Medical history non-contributory    Psoriasis 2013   bx'd by  dermatology per report   Tumor liver     Past Surgical History:  Procedure Laterality Date   CHOLECYSTECTOMY     IR ANGIOGRAM SELECTIVE EACH ADDITIONAL VESSEL  02/06/2023   IR ANGIOGRAM SELECTIVE EACH ADDITIONAL VESSEL  02/06/2023   IR ANGIOGRAM SELECTIVE EACH ADDITIONAL VESSEL  02/06/2023   IR ANGIOGRAM SELECTIVE EACH ADDITIONAL VESSEL  02/06/2023   IR ANGIOGRAM VISCERAL SELECTIVE  02/06/2023   IR EMBO ART  VEN HEMORR LYMPH EXTRAV  INC GUIDE ROADMAPPING  02/06/2023   IR RADIOLOGIST EVAL & MGMT  03/06/2023   IR US GUIDE BX ASP/DRAIN  01/01/2019   IR US GUIDE VASC ACCESS RIGHT  02/06/2023   TUMOR REMOVAL      Allergies: Patient has no known allergies.  Medications: Prior to Admission medications   Medication Sig Start Date End Date Taking? Authorizing Provider  acetaminophen (TYLENOL) 325 MG tablet Take 2 tablets (650 mg total) by mouth every 6 (six) hours as needed for mild pain, fever, moderate pain or headache (or Fever >/= 101). 02/10/23   Pokhrel, Corrie Mckusick, MD  alum & mag hydroxide-simeth (MAALOX/MYLANTA) 200-200-20 MG/5ML suspension Take 30 mLs by mouth every 6 (six) hours as needed for indigestion or heartburn (or bloating). 02/10/23   Pokhrel, Corrie Mckusick, MD  HYDROcodone-acetaminophen (NORCO/VICODIN) 5-325 MG tablet Take 1-2 tablets by mouth every 4 (four) hours as needed for moderate pain or severe pain. 02/10/23   Pokhrel, Corrie Mckusick, MD  methocarbamol 1000 MG TABS Take 500 mg by mouth every 8 (eight) hours as needed for muscle spasms. 02/10/23   Pokhrel, Corrie Mckusick, MD  ondansetron [facility]) 4  MG tablet Take 1 tablet (4 mg total) by mouth every 6 (six) hours as needed for nausea. 02/10/23   Pokhrel, Corrie Mckusick, MD  polyethylene glycol (MIRALAX / GLYCOLAX) 17 g packet Take 17 g by mouth daily as needed for mild constipation. 02/10/23   Pokhrel, Corrie Mckusick, MD  simethicone (MYLICON) 80 MG chewable tablet Chew 1 tablet (80 mg total) by mouth every 6 (six) hours as needed for flatulence. 02/10/23   Pokhrel, Corrie Mckusick, MD      Family History  Problem Relation Age of Onset   Diabetes Mother    CAD Father    Breast cancer Maternal Grandmother    Colon cancer Maternal Grandfather    Pancreatic cancer Other        Maternal Great Uncle    Social History   Socioeconomic History   Marital status: Single    Spouse name: Not on file   Number of children: Not on file   Years of education: Not on file   Highest education level: Not on file  Occupational History   Not on file  Tobacco Use   Smoking status: Never   Smokeless tobacco: Never  Vaping Use   Vaping Use: Never used  Substance and Sexual Activity   Alcohol use: Never   Drug use: Never   Sexual activity: Not on file  Other Topics Concern   Not on file  Social History Narrative   Not on file   Social Determinants of Health   Financial Resource Strain: Not on file  Food Insecurity: No Food Insecurity (02/02/2023)   Hunger Vital Sign    Worried About Running Out of Food in the Last Year: Never true    [place] in the Last Year: Never true  Transportation Needs: No Transportation Needs (02/02/2023)   PRAPARE - Hydrologist (Medical): No    Lack of Transportation (Non-Medical): No  Physical Activity: Not on file  Stress: Not on file  Social Connections: Not on file    Review of Systems As above  Vital Signs: BP 129/80   Pulse 82   Temp 98 F (36.7 C) (Oral)   Wt 82.6 kg   SpO2 98%   BMI 31.24 kg/m    Physical Exam  General: WN, NAD  CV: RRR on monitor Pulm: normal work of breathing on RA Abd: S, ND, NT MSK: Grossly normal Psych: Appropriate affect.   Imaging:  DG Abd 1 View  Result Date: 02/07/2023 CLINICAL DATA:  Ileus.  Liver embolization yesterday. EXAM: ABDOMEN - 1 VIEW COMPARISON:  CT abdomen 02/06/2023 FINDINGS: Contrast medium noted in the colon. No dilated small bowel identified, a left abdominal small bowel loop measures 2.6 cm in diameter. Calcified uterine fibroid noted.  Stable high density material lining the inferior margin of the right hepatic lobe likely residual from prior resections. Contrast medium in the urinary bladder. IMPRESSION: 1. No dilated small bowel identified. Contrast medium in the colon. 2. Calcified uterine fibroid. 3. Contrast medium in the urinary bladder. Electronically Signed   By: Van Clines M.D.   On: 02/07/2023 10:05   IR EMBO ART  VEN HEMORR LYMPH EXTRAV  INC GUIDE ROADMAPPING  Result Date: 02/06/2023 INDICATION: Hemorrhagic liver mass Briefly, 57 year old female with history of multifocal hepatic adenomas with previous resections presenting with hemorrhagic dominant RIGHT hepatic lobe tumor. EXAM: Procedures: 1. CELIAC and HEPATIC ARTERIOGRAPHY 2. GEL FOAM EMBOLIZATION of HEMORRHAGIC RIGHT HEPATIC LOBE MASS COMPARISON:  CT CAP, earlier same  day. MEDICATIONS: 3.375 Zosyn IV. The antibiotic was administered within 1 hour of the procedure. 4 mg Zofran IV. ANESTHESIA/SEDATION: Moderate (conscious) sedation was employed during this procedure. A total of Versed 2 mg and Fentanyl 100 mcg was administered intravenously. Moderate Sedation Time: 71 minutes. The patient's level of consciousness and vital signs were monitored continuously by radiology nursing throughout the procedure under my direct supervision. CONTRAST:  50 mL Omnipaque 300 FLUOROSCOPY TIME:  Fluoroscopic dose; [id number] mGy COMPLICATIONS: None immediate. PROCEDURE: Informed consent was obtained from the patient and/or patient's representative following explanation of the procedure, risks, benefits and alternatives. All questions were addressed. A time out was performed prior to the initiation of the procedure. Maximal barrier sterile technique utilized including caps, mask, sterile gowns, sterile gloves, large sterile drape, hand hygiene, and chlorhexidine prep. At the RIGHT groin 1% lidocaine was used for local anesthesia. Limited ultrasound imaging of the groin shows the RIGHT femoral  artery to be patent. An ultrasound image was saved and sent to PACS. Arterial access was obtained with a 21-G, 7-cm needle under direct ultrasound guidance, through which a 0.018-inch guidewire was advanced under fluoroscopy. The 21-G needle was then exchanged for a micropuncture catheter and a 0.035-inch Bentson guidewire. The micropuncture catheter was exchanged for a 5 Fr sheath. Over the wire, a 5 Fr Cobra C2 catheter was positioned within the proximal abdominal aorta and used to select the celiac axis. Once catheterized, a digital subtraction angiogram (DSA) was obtained. A 2.8 Fr 110 cm Progreat microcatheter and 0.016 inch Fathom microwire was used to gain access to the common then proper then LEFT and RIGHT arteries. Selective DSAs were performed at each level. Finally the target vessel branching off the RIGHT hepatic artery was isolated and a subselective DSA was performed. PURPOSE OF THE ARTERIOGRAM: No previous catheter-directed angiogram was available. Therefore a new complete diagnostic angiography was performed. The decision to proceed with an interventional procedure was made based on this new diagnostic angiogram. Selective branch RIGHT hepatic artery embolization was then performed using Gelfoam slurry. A final angiogram was performed. Images were reviewed and the procedure was terminated. All wires, catheters and sheaths were removed from the patient. Hemostasis was achieved at the RIGHT groin access site with Angio-Seal closure. The patient tolerated the procedure well without immediate post procedural complication. FINDINGS: *Hepatic arteriography with active extravasation at the large RIGHT hepatic lobe mass, originating off a cephalad-projecting branch. *Successful selective Gelfoam slurry embolization of a branch RIGHT hepatic artery, as above. IMPRESSION: Active extravasation from large RIGHT hepatic lobe mass, successfully treated with selective catheter-directed Gelfoam embolization. PLAN:  *Post sheath removal precautions including RIGHT lower extremity slight straight x2 hours. *Serial abdominal examinations and H/h trend to evaluate potential ongoing hepatic bleeding. Michaelle Birks, MD [facility] Radiology Electronically Signed   By: Michaelle Birks M.D.   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 EXAM: CT CHEST, ABDOMEN, AND PELVIS WITH CONTRAST TECHNIQUE: Multidetector CT imaging of the chest, abdomen and pelvis was performed following the standard protocol during bolus administration of intravenous contrast. RADIATION DOSE REDUCTION: This exam was performed according to the departmental dose-optimization program which includes automated exposure control, adjustment of the mA and/or kV according to patient size and/or use of iterative reconstruction technique. CONTRAST:  151m OMNIPAQUE IOHEXOL 300 MG/ML  SOLN COMPARISON:  CT  AP, 02/02/2023 and 03/26/2021. Chest XR, concurrent. FINDINGS: CT CHEST FINDINGS Cardiovascular: No acute intrathoracic vascular abnormality. Normal heart size. No pericardial effusion. Mediastinum/Nodes: No enlarged mediastinal, hilar, or axillary lymph nodes. Thyroid gland, trachea, and esophagus demonstrate no significant findings. Lungs/Pleura: Bilateral 8 mm subsolid pulmonary nodules, within the LEFT upper and RIGHT lower lobes. Additional 5 mm nodularity along the RIGHT major fissure is likely to represent an intrapulmonary lymph node. Lungs are otherwise clear without focal consolidation or mass. No pleural effusion or pneumothorax. Musculoskeletal: No acute chest wall mass or suspicious bone lesion. CT ABDOMEN PELVIS FINDINGS Hepatobiliary: *Dominant RIGHT superior hepatic lobe mass appears heterogeneously dense with new prominent hypodense component.  *The mass measures approximately 8.0 x 10.2 x 10.0 cm (AP by transaxial by CC), previously 9.9 x 12.8 (AP by transaxial). *Question enhancing focus medial to the mass, suspicious for angiographic spot sign and active extravasation. *Postsurgical changes of prior RIGHT and LEFT liver mass resections. Status post cholecystectomy. No biliary dilatation. Pancreas: No pancreatic ductal dilatation. Spleen: Normal in size without focal abnormality. Adrenals/Urinary Tract: Adrenal glands are unremarkable. Kidneys are normal, without renal calculi, focal lesion, or hydronephrosis. Bladder is unremarkable. Stomach/Bowel: Stomach is within normal limits. Appendix appears normal. Nonobstructed small bowel. Nondilated colon. Contrast opacification of distal small bowel and colon without extraluminal extravasation. No evidence of bowel wall thickening, distention, or inflammatory changes. Vascular/Lymphatic: No significant vascular findings are present. No enlarged abdominal or pelvic lymph nodes. Reproductive: Peripherally-calcified RIGHT fibroid. Prominent ovarian follicles. No adnexal mass. Other: Trace perirenal, peripancreatic and anterior pararenal space fat stranding. Trace body wall edema. No abdominal wall hernia or abnormality. No abdominopelvic ascites. Musculoskeletal: No acute or significant osseous findings. IMPRESSION: 1. Question of hemorrhagic conversion of dominant RIGHT hepatic lobe mass, previously characterized as adenoma. Small focus of enhancement medial to the mass is suspicious for active extravasation. See key image. 2. Trace LEFT perirenal and anterior pararenal space fat stranding is nonspecific. Early pyelonephritis can appear similar. Correlate with urinalysis. 3. Bilateral 8 mm part-solid pulmonary nodules. Per Fleischner Society Guidelines, recommend a non-contrast Chest CT at 3-6 months. These guidelines do not apply to immunocompromised patients and patients with cancer. Reference: Radiology.  2017; 284(1):228-43. These results were called by telephone at the time of interpretation on 02/06/2023 at 3:46 pm to provider [facility] , who verbally acknowledged these results. Electronically Signed   By: Michaelle Birks M.D.   On: 02/06/2023 15:51   DG Chest 1 View  Result Date: 02/06/2023 CLINICAL DATA:  Sepsis EXAM: CHEST  1 VIEW COMPARISON:  01/06/2019 FINDINGS: Heart and mediastinal contours are within normal limits. No focal opacities or effusions. No acute bony abnormality. IMPRESSION: No active disease. Electronically Signed   By: Rolm Baptise M.D.   On: 02/06/2023 00:37   DG Abd 2 Views  Result Date: 02/05/2023 CLINICAL DATA:  Small bowel obstruction. EXAM: ABDOMEN - 2 VIEW COMPARISON:  February 04, 2023. FINDINGS: Distal tip of nasogastric tube is seen in expected position of distal stomach. Residual contrast is noted in nondilated colon. No small bowel dilatation is noted. Calcified uterine fibroid is noted in the pelvis. IMPRESSION: No abnormal bowel dilatation. Electronically Signed   By: Marijo Conception M.D.   On: 02/05/2023 08:40   DG Abd Portable 1V-Small Bowel Obstruction Protocol-initial, 8 hr delay  Result Date: 02/04/2023 CLINICAL DATA:  Small-bowel obstruction EXAM: PORTABLE ABDOMEN - 1 VIEW COMPARISON:  Earlier films of the same day FINDINGS: Nasogastric tube extends to the gastric antrum.  There is hyperdense material in the nondilated stomach and in multiple loops of small bowel which are mildly distended but nondilated. Colon remains decompressed. Peripherally calcified uterine fibroid in the left pelvis. Regional bones unremarkable. IMPRESSION: 1. Nonobstructive bowel gas pattern. 2. Nasogastric tube in the gastric antrum. Electronically Signed   By: Lucrezia Europe M.D.   On: 02/04/2023 20:02    Labs:  CBC: Recent Labs    02/07/23 1539 02/08/23 0316 02/09/23 0335 02/10/23 0303  WBC 14.7* 14.7* 13.3* 14.7*  HGB 9.2* 8.7* 8.8* 8.9*  HCT 28.6* 27.4* 27.5* 28.5*  PLT 244 259  292 333    COAGS: Recent Labs    02/06/23 0106  INR 1.4*  APTT 34    BMP: Recent Labs    02/07/23 0505 02/08/23 0316 02/09/23 0335 02/10/23 0303  NA 137 134* 135 137  K 3.0* 3.9 3.3* 4.0  CL 101 102 105 105  CO2 22 20* 22 21*  GLUCOSE 83 100* 91 89  BUN 8 5* 6 <5*  CALCIUM 7.9* 7.7* 7.6* 7.9*  CREATININE 0.51 0.51 0.43* 0.43*  GFRNONAA >60 >60 >60 >60    LIVER FUNCTION TESTS: Recent Labs    02/02/23 1254 02/03/23 0419 02/06/23 0106 02/08/23 0316  BILITOT 0.9 1.0 1.4* 1.0  AST 21 44* 119* 145*  ALT 19 35 281* 168*  ALKPHOS 263* 230* 231* 172*  PROT 7.6 7.4 6.3* 6.0*  ALBUMIN 3.6 3.5 2.6* 2.2*    TUMOR MARKERS:  AFP, 1.8 (02/07/23) and 0.9 (01/01/2019)   Assessment and Plan:  57 y/o F known to [employer] service most recently s/p embolization of hemorrhagic R hepatic lobe adenoma (02/06/23)  *Pt has re-established care with [facility] surgery in [place], being managed by Dr Linard Millers and Ovidio Kin, NP.  *She is scheduled for a follow up scan as ordered by [facility] team in 03/2023. *[place] in [place]. Should she require additional transcatheter therapy of her liver masses, then I would be happy to offer my assistance as requested by her [facility] team. *Return to [facility] PRN.   Thank you for allowing [place] to participate in the care of this Patient. I greatly enjoyed meeting Monty Seman and look forward to participating in their care.  A copy of this report was sent to the her providers on this date.   Electronically Signed:  Michaelle Birks, MD [facility] Radiology   Pager. [phone] Clinic. [phone]   I spent a total of  25 Minutes in face to face in clinical consultation, greater than 50% of which was counseling/coordinating care for Mrs Mansirat Mastropietro's hepatic adenoma follow up

## 2023-04-04 DIAGNOSIS — C801 Malignant (primary) neoplasm, unspecified: Secondary | ICD-10-CM | POA: Diagnosis not present

## 2023-04-04 DIAGNOSIS — C787 Secondary malignant neoplasm of liver and intrahepatic bile duct: Secondary | ICD-10-CM | POA: Diagnosis not present

## 2023-04-04 DIAGNOSIS — K7689 Other specified diseases of liver: Secondary | ICD-10-CM | POA: Diagnosis not present

## 2023-04-22 DIAGNOSIS — D134 Benign neoplasm of liver: Secondary | ICD-10-CM | POA: Diagnosis not present

## 2023-05-15 DIAGNOSIS — Z01818 Encounter for other preprocedural examination: Secondary | ICD-10-CM | POA: Diagnosis not present

## 2023-05-15 DIAGNOSIS — D134 Benign neoplasm of liver: Secondary | ICD-10-CM | POA: Diagnosis not present

## 2023-05-27 DIAGNOSIS — D134 Benign neoplasm of liver: Secondary | ICD-10-CM | POA: Diagnosis not present

## 2023-07-25 DIAGNOSIS — I2510 Atherosclerotic heart disease of native coronary artery without angina pectoris: Secondary | ICD-10-CM | POA: Diagnosis not present

## 2023-07-28 DIAGNOSIS — Z01810 Encounter for preprocedural cardiovascular examination: Secondary | ICD-10-CM | POA: Diagnosis not present

## 2023-07-28 DIAGNOSIS — Z13220 Encounter for screening for lipoid disorders: Secondary | ICD-10-CM | POA: Diagnosis not present

## 2023-07-28 DIAGNOSIS — Z01818 Encounter for other preprocedural examination: Secondary | ICD-10-CM | POA: Diagnosis not present

## 2023-08-13 DIAGNOSIS — Z23 Encounter for immunization: Secondary | ICD-10-CM | POA: Diagnosis not present

## 2023-08-13 DIAGNOSIS — R7989 Other specified abnormal findings of blood chemistry: Secondary | ICD-10-CM | POA: Diagnosis not present

## 2023-08-14 DIAGNOSIS — D134 Benign neoplasm of liver: Secondary | ICD-10-CM | POA: Diagnosis not present

## 2023-08-14 DIAGNOSIS — Z01818 Encounter for other preprocedural examination: Secondary | ICD-10-CM | POA: Diagnosis not present

## 2023-08-19 DIAGNOSIS — D134 Benign neoplasm of liver: Secondary | ICD-10-CM | POA: Diagnosis not present

## 2023-08-19 DIAGNOSIS — K769 Liver disease, unspecified: Secondary | ICD-10-CM | POA: Diagnosis not present

## 2023-08-19 DIAGNOSIS — Z01818 Encounter for other preprocedural examination: Secondary | ICD-10-CM | POA: Diagnosis not present

## 2023-08-26 DIAGNOSIS — Z1231 Encounter for screening mammogram for malignant neoplasm of breast: Secondary | ICD-10-CM | POA: Diagnosis not present

## 2023-08-26 DIAGNOSIS — Z01818 Encounter for other preprocedural examination: Secondary | ICD-10-CM | POA: Diagnosis not present

## 2023-08-26 DIAGNOSIS — Z13220 Encounter for screening for lipoid disorders: Secondary | ICD-10-CM | POA: Diagnosis not present

## 2023-08-26 DIAGNOSIS — Z01810 Encounter for preprocedural cardiovascular examination: Secondary | ICD-10-CM | POA: Diagnosis not present

## 2023-09-18 DIAGNOSIS — Z23 Encounter for immunization: Secondary | ICD-10-CM | POA: Diagnosis not present

## 2023-10-09 DIAGNOSIS — Z1151 Encounter for screening for human papillomavirus (HPV): Secondary | ICD-10-CM | POA: Diagnosis not present

## 2023-10-09 DIAGNOSIS — Z0000 Encounter for general adult medical examination without abnormal findings: Secondary | ICD-10-CM | POA: Diagnosis not present

## 2023-10-09 DIAGNOSIS — D259 Leiomyoma of uterus, unspecified: Secondary | ICD-10-CM | POA: Diagnosis not present

## 2023-10-14 DIAGNOSIS — D72828 Other elevated white blood cell count: Secondary | ICD-10-CM | POA: Diagnosis not present

## 2023-10-14 DIAGNOSIS — D649 Anemia, unspecified: Secondary | ICD-10-CM | POA: Diagnosis not present

## 2023-10-14 DIAGNOSIS — D75839 Thrombocytosis, unspecified: Secondary | ICD-10-CM | POA: Diagnosis not present

## 2023-10-23 DIAGNOSIS — C801 Malignant (primary) neoplasm, unspecified: Secondary | ICD-10-CM | POA: Diagnosis not present

## 2023-10-23 DIAGNOSIS — C787 Secondary malignant neoplasm of liver and intrahepatic bile duct: Secondary | ICD-10-CM | POA: Diagnosis not present

## 2023-11-07 DIAGNOSIS — D134 Benign neoplasm of liver: Secondary | ICD-10-CM | POA: Diagnosis not present

## 2023-11-19 DIAGNOSIS — Z1211 Encounter for screening for malignant neoplasm of colon: Secondary | ICD-10-CM | POA: Diagnosis not present

## 2023-11-19 DIAGNOSIS — K648 Other hemorrhoids: Secondary | ICD-10-CM | POA: Diagnosis not present

## 2023-11-19 DIAGNOSIS — Z01818 Encounter for other preprocedural examination: Secondary | ICD-10-CM | POA: Diagnosis not present

## 2023-12-18 DIAGNOSIS — Z6832 Body mass index (BMI) 32.0-32.9, adult: Secondary | ICD-10-CM | POA: Diagnosis not present

## 2023-12-18 DIAGNOSIS — E66811 Obesity, class 1: Secondary | ICD-10-CM | POA: Diagnosis not present

## 2023-12-18 DIAGNOSIS — R7989 Other specified abnormal findings of blood chemistry: Secondary | ICD-10-CM | POA: Diagnosis not present

## 2023-12-18 DIAGNOSIS — R748 Abnormal levels of other serum enzymes: Secondary | ICD-10-CM | POA: Diagnosis not present

## 2023-12-18 DIAGNOSIS — E6609 Other obesity due to excess calories: Secondary | ICD-10-CM | POA: Diagnosis not present

## 2023-12-18 DIAGNOSIS — H6993 Unspecified Eustachian tube disorder, bilateral: Secondary | ICD-10-CM | POA: Diagnosis not present

## 2023-12-18 DIAGNOSIS — Z131 Encounter for screening for diabetes mellitus: Secondary | ICD-10-CM | POA: Diagnosis not present

## 2024-02-23 DIAGNOSIS — Z23 Encounter for immunization: Secondary | ICD-10-CM | POA: Diagnosis not present

## 2024-03-09 DIAGNOSIS — D134 Benign neoplasm of liver: Secondary | ICD-10-CM | POA: Diagnosis not present

## 2024-03-23 DIAGNOSIS — Z01818 Encounter for other preprocedural examination: Secondary | ICD-10-CM | POA: Diagnosis not present

## 2024-03-23 DIAGNOSIS — Z9889 Other specified postprocedural states: Secondary | ICD-10-CM | POA: Diagnosis not present

## 2024-03-23 DIAGNOSIS — D134 Benign neoplasm of liver: Secondary | ICD-10-CM | POA: Diagnosis not present

## 2024-03-23 DIAGNOSIS — R1900 Intra-abdominal and pelvic swelling, mass and lump, unspecified site: Secondary | ICD-10-CM | POA: Diagnosis not present

## 2024-03-23 DIAGNOSIS — R160 Hepatomegaly, not elsewhere classified: Secondary | ICD-10-CM | POA: Diagnosis not present

## 2024-10-18 DIAGNOSIS — D134 Benign neoplasm of liver: Secondary | ICD-10-CM | POA: Diagnosis not present

## 2024-10-18 DIAGNOSIS — R7989 Other specified abnormal findings of blood chemistry: Secondary | ICD-10-CM | POA: Diagnosis not present

## 2024-10-18 DIAGNOSIS — R1013 Epigastric pain: Secondary | ICD-10-CM | POA: Diagnosis not present

## 2024-10-18 DIAGNOSIS — D649 Anemia, unspecified: Secondary | ICD-10-CM | POA: Diagnosis not present

## 2024-10-21 DIAGNOSIS — Z1231 Encounter for screening mammogram for malignant neoplasm of breast: Secondary | ICD-10-CM | POA: Diagnosis not present

## 2024-10-26 DIAGNOSIS — D134 Benign neoplasm of liver: Secondary | ICD-10-CM | POA: Diagnosis not present

## 2024-10-26 DIAGNOSIS — Z01818 Encounter for other preprocedural examination: Secondary | ICD-10-CM | POA: Diagnosis not present

## 2024-10-26 DIAGNOSIS — K769 Liver disease, unspecified: Secondary | ICD-10-CM | POA: Diagnosis not present

## 2024-11-09 DIAGNOSIS — D134 Benign neoplasm of liver: Secondary | ICD-10-CM | POA: Diagnosis not present

## 2024-11-09 DIAGNOSIS — Z01810 Encounter for preprocedural cardiovascular examination: Secondary | ICD-10-CM | POA: Diagnosis not present

## 2024-11-09 DIAGNOSIS — Z01818 Encounter for other preprocedural examination: Secondary | ICD-10-CM | POA: Diagnosis not present

## 2024-11-22 DIAGNOSIS — Z01818 Encounter for other preprocedural examination: Secondary | ICD-10-CM | POA: Diagnosis not present

## 2024-12-09 DIAGNOSIS — K7210 Chronic hepatic failure without coma: Secondary | ICD-10-CM | POA: Diagnosis not present

## 2024-12-09 DIAGNOSIS — Z0001 Encounter for general adult medical examination with abnormal findings: Secondary | ICD-10-CM | POA: Diagnosis not present

## 2024-12-09 DIAGNOSIS — Z780 Asymptomatic menopausal state: Secondary | ICD-10-CM | POA: Diagnosis not present
